# Patient Record
Sex: Female | Born: 1945 | Race: Black or African American | Hispanic: No | Marital: Single | Smoking: Former smoker
Health system: Southern US, Community
[De-identification: ages and names within clinical notes are randomized; demographics above are authoritative.]

## PROBLEM LIST (undated history)

## (undated) DIAGNOSIS — L409 Psoriasis, unspecified: Secondary | ICD-10-CM

## (undated) DIAGNOSIS — J310 Chronic rhinitis: Secondary | ICD-10-CM

## (undated) DIAGNOSIS — L2989 Other pruritus: Secondary | ICD-10-CM

## (undated) DIAGNOSIS — D329 Benign neoplasm of meninges, unspecified: Secondary | ICD-10-CM

## (undated) DIAGNOSIS — R202 Paresthesia of skin: Secondary | ICD-10-CM

## (undated) DIAGNOSIS — E079 Disorder of thyroid, unspecified: Secondary | ICD-10-CM

## (undated) DIAGNOSIS — Z8639 Personal history of other endocrine, nutritional and metabolic disease: Secondary | ICD-10-CM

## (undated) DIAGNOSIS — K219 Gastro-esophageal reflux disease without esophagitis: Secondary | ICD-10-CM

## (undated) DIAGNOSIS — L298 Other pruritus: Secondary | ICD-10-CM

## (undated) DIAGNOSIS — M1990 Unspecified osteoarthritis, unspecified site: Secondary | ICD-10-CM

## (undated) DIAGNOSIS — G709 Myoneural disorder, unspecified: Secondary | ICD-10-CM

## (undated) DIAGNOSIS — I10 Essential (primary) hypertension: Secondary | ICD-10-CM

## (undated) DIAGNOSIS — D649 Anemia, unspecified: Secondary | ICD-10-CM

## (undated) HISTORY — DX: Gastro-esophageal reflux disease without esophagitis: K21.9

## (undated) HISTORY — DX: Paresthesia of skin: R20.2

## (undated) HISTORY — DX: Benign neoplasm of meninges, unspecified: D32.9

## (undated) HISTORY — DX: Chronic rhinitis: J31.0

## (undated) HISTORY — DX: Other pruritus: L29.8

## (undated) HISTORY — DX: Anemia, unspecified: D64.9

## (undated) HISTORY — DX: Psoriasis, unspecified: L40.9

## (undated) HISTORY — PX: TONSILLECTOMY: SUR1361

## (undated) HISTORY — PX: OOPHORECTOMY: SHX86

## (undated) HISTORY — DX: Essential (primary) hypertension: I10

## (undated) HISTORY — DX: Other pruritus: L29.89

## (undated) HISTORY — DX: Disorder of thyroid, unspecified: E07.9

---

## 1973-03-13 HISTORY — PX: VESICOVAGINAL FISTULA CLOSURE W/ TAH: SUR271

## 1973-03-13 HISTORY — PX: ABDOMINAL HYSTERECTOMY: SHX81

## 1974-03-13 HISTORY — PX: ABDOMINAL SURGERY: SHX537

## 1974-03-13 HISTORY — PX: OTHER SURGICAL HISTORY: SHX169

## 1975-03-14 HISTORY — PX: OTHER SURGICAL HISTORY: SHX169

## 2000-03-13 HISTORY — PX: NASAL SINUS SURGERY: SHX719

## 2000-08-13 ENCOUNTER — Encounter (INDEPENDENT_AMBULATORY_CARE_PROVIDER_SITE_OTHER): Payer: Self-pay | Admitting: Specialist

## 2000-08-13 ENCOUNTER — Ambulatory Visit (HOSPITAL_BASED_OUTPATIENT_CLINIC_OR_DEPARTMENT_OTHER): Admission: RE | Admit: 2000-08-13 | Discharge: 2000-08-13 | Payer: Self-pay | Admitting: *Deleted

## 2003-01-15 ENCOUNTER — Encounter: Admission: RE | Admit: 2003-01-15 | Discharge: 2003-01-15 | Payer: Self-pay | Admitting: Family Medicine

## 2003-03-14 HISTORY — PX: OTHER SURGICAL HISTORY: SHX169

## 2004-02-09 ENCOUNTER — Ambulatory Visit (HOSPITAL_COMMUNITY): Admission: RE | Admit: 2004-02-09 | Discharge: 2004-02-09 | Payer: Self-pay | Admitting: Gastroenterology

## 2004-03-13 HISTORY — PX: COLONOSCOPY: SHX174

## 2004-03-13 LAB — HM COLONOSCOPY: HM Colonoscopy: NORMAL

## 2004-06-17 ENCOUNTER — Ambulatory Visit: Payer: Self-pay | Admitting: Family Medicine

## 2004-07-19 ENCOUNTER — Ambulatory Visit: Payer: Self-pay | Admitting: Internal Medicine

## 2005-02-10 HISTORY — PX: ORIF FINGER / THUMB FRACTURE: SUR932

## 2005-02-16 ENCOUNTER — Emergency Department (HOSPITAL_COMMUNITY): Admission: EM | Admit: 2005-02-16 | Discharge: 2005-02-17 | Payer: Self-pay | Admitting: *Deleted

## 2005-02-28 ENCOUNTER — Ambulatory Visit: Payer: Self-pay | Admitting: Family Medicine

## 2005-03-24 ENCOUNTER — Encounter (INDEPENDENT_AMBULATORY_CARE_PROVIDER_SITE_OTHER): Payer: Self-pay | Admitting: *Deleted

## 2005-03-24 ENCOUNTER — Other Ambulatory Visit: Admission: RE | Admit: 2005-03-24 | Discharge: 2005-03-24 | Payer: Self-pay | Admitting: Family Medicine

## 2005-03-24 ENCOUNTER — Ambulatory Visit: Payer: Self-pay | Admitting: Family Medicine

## 2005-03-24 LAB — CONVERTED CEMR LAB: Pap Smear: NORMAL

## 2005-03-28 ENCOUNTER — Ambulatory Visit: Payer: Self-pay | Admitting: Family Medicine

## 2005-07-28 ENCOUNTER — Ambulatory Visit: Payer: Self-pay | Admitting: Family Medicine

## 2005-11-23 ENCOUNTER — Ambulatory Visit: Payer: Self-pay | Admitting: Family Medicine

## 2005-12-20 ENCOUNTER — Ambulatory Visit: Payer: Self-pay | Admitting: Family Medicine

## 2006-05-01 ENCOUNTER — Ambulatory Visit: Payer: Self-pay | Admitting: Family Medicine

## 2006-06-12 ENCOUNTER — Encounter: Payer: Self-pay | Admitting: Family Medicine

## 2006-06-12 DIAGNOSIS — E162 Hypoglycemia, unspecified: Secondary | ICD-10-CM

## 2006-06-12 DIAGNOSIS — K219 Gastro-esophageal reflux disease without esophagitis: Secondary | ICD-10-CM

## 2006-06-12 DIAGNOSIS — D649 Anemia, unspecified: Secondary | ICD-10-CM

## 2006-06-12 DIAGNOSIS — J309 Allergic rhinitis, unspecified: Secondary | ICD-10-CM

## 2006-06-12 DIAGNOSIS — I10 Essential (primary) hypertension: Secondary | ICD-10-CM

## 2006-07-03 ENCOUNTER — Ambulatory Visit: Payer: Self-pay | Admitting: Family Medicine

## 2006-11-13 ENCOUNTER — Ambulatory Visit: Payer: Self-pay | Admitting: Family Medicine

## 2006-11-14 ENCOUNTER — Telehealth (INDEPENDENT_AMBULATORY_CARE_PROVIDER_SITE_OTHER): Payer: Self-pay | Admitting: *Deleted

## 2006-11-19 ENCOUNTER — Telehealth (INDEPENDENT_AMBULATORY_CARE_PROVIDER_SITE_OTHER): Payer: Self-pay | Admitting: *Deleted

## 2006-12-04 ENCOUNTER — Telehealth: Payer: Self-pay | Admitting: Family Medicine

## 2007-02-25 ENCOUNTER — Telehealth: Payer: Self-pay | Admitting: Family Medicine

## 2007-03-01 ENCOUNTER — Ambulatory Visit: Payer: Self-pay | Admitting: Family Medicine

## 2007-03-14 LAB — HM DEXA SCAN

## 2007-03-19 ENCOUNTER — Ambulatory Visit: Payer: Self-pay | Admitting: Family Medicine

## 2007-03-19 DIAGNOSIS — M810 Age-related osteoporosis without current pathological fracture: Secondary | ICD-10-CM

## 2007-03-22 ENCOUNTER — Telehealth: Payer: Self-pay | Admitting: Family Medicine

## 2007-03-22 LAB — CONVERTED CEMR LAB
ALT: 10 units/L (ref 0–35)
AST: 21 units/L (ref 0–37)
Albumin: 4.1 g/dL (ref 3.5–5.2)
Alkaline Phosphatase: 62 units/L (ref 39–117)
BUN: 6 mg/dL (ref 6–23)
Basophils Absolute: 0.2 10*3/uL — ABNORMAL HIGH (ref 0.0–0.1)
Basophils Relative: 3.8 % — ABNORMAL HIGH (ref 0.0–1.0)
Bilirubin, Direct: 0.1 mg/dL (ref 0.0–0.3)
CO2: 30 meq/L (ref 19–32)
Calcium: 9.5 mg/dL (ref 8.4–10.5)
Chloride: 104 meq/L (ref 96–112)
Cholesterol: 166 mg/dL (ref 0–200)
Creatinine, Ser: 0.7 mg/dL (ref 0.4–1.2)
Eosinophils Absolute: 0.1 10*3/uL (ref 0.0–0.6)
Eosinophils Relative: 2.2 % (ref 0.0–5.0)
GFR calc Af Amer: 110 mL/min
GFR calc non Af Amer: 91 mL/min
Glucose, Bld: 84 mg/dL (ref 70–99)
HCT: 35.3 % — ABNORMAL LOW (ref 36.0–46.0)
HDL: 51.2 mg/dL (ref >39.0)
Hemoglobin: 12.1 g/dL (ref 12.0–15.0)
LDL Cholesterol: 103 mg/dL — ABNORMAL HIGH (ref 0–99)
Lymphocytes Relative: 41.5 % (ref 12.0–46.0)
MCHC: 34.3 g/dL (ref 30.0–36.0)
MCV: 86.8 fL (ref 78.0–100.0)
Monocytes Absolute: 0.2 10*3/uL (ref 0.2–0.7)
Monocytes Relative: 2.8 % — ABNORMAL LOW (ref 3.0–11.0)
Neutro Abs: 3.1 10*3/uL (ref 1.4–7.7)
Neutrophils Relative %: 49.7 % (ref 43.0–77.0)
Platelets: 253 10*3/uL (ref 150–400)
Potassium: 4 meq/L (ref 3.5–5.1)
RBC: 4.07 M/uL (ref 3.87–5.11)
RDW: 14.1 % (ref 11.5–14.6)
Sodium: 141 meq/L (ref 135–145)
TSH: 0.23 microintl units/mL — ABNORMAL LOW (ref 0.35–5.50)
Total Bilirubin: 1 mg/dL (ref 0.3–1.2)
Total CHOL/HDL Ratio: 3.2
Total Protein: 7 g/dL (ref 6.0–8.3)
Triglycerides: 58 mg/dL (ref 0–149)
VLDL: 12 mg/dL (ref 0–40)
WBC: 6.1 10*3/uL (ref 4.5–10.5)

## 2007-03-25 ENCOUNTER — Encounter: Payer: Self-pay | Admitting: Family Medicine

## 2007-03-26 ENCOUNTER — Ambulatory Visit: Payer: Self-pay | Admitting: Family Medicine

## 2007-03-26 ENCOUNTER — Telehealth: Payer: Self-pay | Admitting: Family Medicine

## 2007-03-28 LAB — CONVERTED CEMR LAB
Free T4: 0.6 ng/dL (ref 0.6–1.6)
T3, Free: 2.8 pg/mL (ref 2.3–4.2)
TSH: 0.87 microintl units/mL (ref 0.35–5.50)

## 2007-03-29 ENCOUNTER — Encounter (INDEPENDENT_AMBULATORY_CARE_PROVIDER_SITE_OTHER): Payer: Self-pay | Admitting: *Deleted

## 2007-04-16 ENCOUNTER — Telehealth: Payer: Self-pay | Admitting: Family Medicine

## 2007-05-14 ENCOUNTER — Telehealth: Payer: Self-pay | Admitting: Family Medicine

## 2007-05-20 ENCOUNTER — Ambulatory Visit: Payer: Self-pay | Admitting: Family Medicine

## 2007-05-20 ENCOUNTER — Encounter: Admission: RE | Admit: 2007-05-20 | Discharge: 2007-05-20 | Payer: Self-pay | Admitting: Family Medicine

## 2007-05-23 ENCOUNTER — Encounter: Payer: Self-pay | Admitting: Family Medicine

## 2007-08-13 ENCOUNTER — Ambulatory Visit: Payer: Self-pay | Admitting: Family Medicine

## 2007-08-15 ENCOUNTER — Telehealth: Payer: Self-pay | Admitting: Family Medicine

## 2007-10-18 ENCOUNTER — Telehealth (INDEPENDENT_AMBULATORY_CARE_PROVIDER_SITE_OTHER): Payer: Self-pay | Admitting: *Deleted

## 2008-03-23 ENCOUNTER — Telehealth (INDEPENDENT_AMBULATORY_CARE_PROVIDER_SITE_OTHER): Payer: Self-pay | Admitting: *Deleted

## 2008-03-25 ENCOUNTER — Encounter: Payer: Self-pay | Admitting: Family Medicine

## 2008-03-27 ENCOUNTER — Encounter (INDEPENDENT_AMBULATORY_CARE_PROVIDER_SITE_OTHER): Payer: Self-pay | Admitting: *Deleted

## 2008-04-16 ENCOUNTER — Ambulatory Visit: Payer: Self-pay | Admitting: Family Medicine

## 2008-04-16 ENCOUNTER — Encounter: Admission: RE | Admit: 2008-04-16 | Discharge: 2008-04-16 | Payer: Self-pay | Admitting: Family Medicine

## 2008-05-14 ENCOUNTER — Ambulatory Visit: Payer: Self-pay | Admitting: Family Medicine

## 2008-10-16 ENCOUNTER — Ambulatory Visit: Payer: Self-pay | Admitting: Family Medicine

## 2008-10-21 LAB — CONVERTED CEMR LAB
ALT: 41 units/L — ABNORMAL HIGH (ref 0–35)
AST: 120 units/L — ABNORMAL HIGH (ref 0–37)
Albumin: 4.2 g/dL (ref 3.5–5.2)
Alkaline Phosphatase: 68 units/L (ref 39–117)
BUN: 7 mg/dL (ref 6–23)
Basophils Absolute: 0 10*3/uL (ref 0.0–0.1)
Basophils Relative: 0 % (ref 0.0–3.0)
Bilirubin, Direct: 0.1 mg/dL (ref 0.0–0.3)
CO2: 31 meq/L (ref 19–32)
Calcium: 9.5 mg/dL (ref 8.4–10.5)
Chloride: 105 meq/L (ref 96–112)
Creatinine, Ser: 0.8 mg/dL (ref 0.4–1.2)
Eosinophils Absolute: 0.2 10*3/uL (ref 0.0–0.7)
Eosinophils Relative: 4.2 % (ref 0.0–5.0)
GFR calc non Af Amer: 93.77 mL/min (ref >60)
Glucose, Bld: 96 mg/dL (ref 70–99)
HCT: 37 % (ref 36.0–46.0)
Hemoglobin: 12.5 g/dL (ref 12.0–15.0)
Lymphocytes Relative: 43.9 % (ref 12.0–46.0)
Lymphs Abs: 2 10*3/uL (ref 0.7–4.0)
MCHC: 33.7 g/dL (ref 30.0–36.0)
MCV: 89.6 fL (ref 78.0–100.0)
Monocytes Absolute: 0.4 10*3/uL (ref 0.1–1.0)
Monocytes Relative: 7.7 % (ref 3.0–12.0)
Neutro Abs: 2 10*3/uL (ref 1.4–7.7)
Neutrophils Relative %: 44.2 % (ref 43.0–77.0)
Platelets: 204 10*3/uL (ref 150.0–400.0)
Potassium: 4.4 meq/L (ref 3.5–5.1)
RBC: 4.13 M/uL (ref 3.87–5.11)
RDW: 13.9 % (ref 11.5–14.6)
Sodium: 143 meq/L (ref 135–145)
TSH: 0.54 microintl units/mL (ref 0.35–5.50)
Total Bilirubin: 0.8 mg/dL (ref 0.3–1.2)
Total Protein: 7.4 g/dL (ref 6.0–8.3)
WBC: 4.6 10*3/uL (ref 4.5–10.5)

## 2008-10-23 LAB — CONVERTED CEMR LAB: Vit D, 25-Hydroxy: 43 ng/mL (ref 30–89)

## 2008-10-29 ENCOUNTER — Ambulatory Visit: Payer: Self-pay | Admitting: Family Medicine

## 2008-10-29 ENCOUNTER — Telehealth: Payer: Self-pay | Admitting: Family Medicine

## 2008-10-30 LAB — CONVERTED CEMR LAB
ALT: 14 units/L (ref 0–35)
AST: 21 units/L (ref 0–37)
Albumin: 4 g/dL (ref 3.5–5.2)
Alkaline Phosphatase: 56 units/L (ref 39–117)
Bilirubin, Direct: 0.1 mg/dL (ref 0.0–0.3)
Total Bilirubin: 0.9 mg/dL (ref 0.3–1.2)
Total Protein: 7.3 g/dL (ref 6.0–8.3)

## 2009-01-13 ENCOUNTER — Ambulatory Visit: Payer: Self-pay | Admitting: Family Medicine

## 2009-02-18 ENCOUNTER — Telehealth: Payer: Self-pay | Admitting: Family Medicine

## 2009-05-31 ENCOUNTER — Telehealth: Payer: Self-pay | Admitting: Internal Medicine

## 2009-06-08 ENCOUNTER — Ambulatory Visit: Payer: Self-pay | Admitting: Family Medicine

## 2009-06-18 ENCOUNTER — Telehealth: Payer: Self-pay | Admitting: Family Medicine

## 2009-10-11 HISTORY — PX: OTHER SURGICAL HISTORY: SHX169

## 2009-10-21 ENCOUNTER — Encounter: Payer: Self-pay | Admitting: Family Medicine

## 2009-10-21 LAB — HM MAMMOGRAPHY: HM Mammogram: NORMAL

## 2009-10-25 ENCOUNTER — Encounter: Payer: Self-pay | Admitting: Family Medicine

## 2009-11-06 ENCOUNTER — Inpatient Hospital Stay: Payer: Self-pay | Admitting: Internal Medicine

## 2009-11-06 ENCOUNTER — Encounter: Payer: Self-pay | Admitting: Family Medicine

## 2009-11-07 ENCOUNTER — Encounter: Payer: Self-pay | Admitting: Family Medicine

## 2009-11-17 ENCOUNTER — Ambulatory Visit: Payer: Self-pay | Admitting: Family Medicine

## 2009-11-17 DIAGNOSIS — D320 Benign neoplasm of cerebral meninges: Secondary | ICD-10-CM | POA: Insufficient documentation

## 2009-11-17 DIAGNOSIS — G459 Transient cerebral ischemic attack, unspecified: Secondary | ICD-10-CM | POA: Insufficient documentation

## 2009-11-24 ENCOUNTER — Telehealth: Payer: Self-pay | Admitting: Family Medicine

## 2010-01-20 ENCOUNTER — Encounter: Payer: Self-pay | Admitting: Family Medicine

## 2010-01-27 ENCOUNTER — Telehealth: Payer: Self-pay | Admitting: Family Medicine

## 2010-02-07 ENCOUNTER — Ambulatory Visit: Payer: Self-pay | Admitting: Family Medicine

## 2010-02-08 ENCOUNTER — Telehealth: Payer: Self-pay | Admitting: Family Medicine

## 2010-02-10 ENCOUNTER — Telehealth: Payer: Self-pay | Admitting: Family Medicine

## 2010-02-11 ENCOUNTER — Telehealth (INDEPENDENT_AMBULATORY_CARE_PROVIDER_SITE_OTHER): Payer: Self-pay

## 2010-02-14 ENCOUNTER — Telehealth: Payer: Self-pay | Admitting: Family Medicine

## 2010-02-15 ENCOUNTER — Ambulatory Visit: Payer: Self-pay | Admitting: Internal Medicine

## 2010-02-16 ENCOUNTER — Telehealth: Payer: Self-pay | Admitting: Family Medicine

## 2010-02-18 ENCOUNTER — Telehealth: Payer: Self-pay | Admitting: Family Medicine

## 2010-02-22 ENCOUNTER — Ambulatory Visit: Payer: Self-pay | Admitting: Family Medicine

## 2010-03-21 ENCOUNTER — Ambulatory Visit
Admission: RE | Admit: 2010-03-21 | Discharge: 2010-03-21 | Payer: Self-pay | Source: Home / Self Care | Attending: Family Medicine | Admitting: Family Medicine

## 2010-04-12 NOTE — Progress Notes (Signed)
Summary: ? reaction to sulfa  Phone Note Call from Patient Call back at Home Phone [phone]   Caller: Patient Call For: Judith Part MD Summary of Call: Pt states she took one dose of bactrim and is having some tingling around mouth and in hands.  She is also having some mild itching.  She has taken sulfa in the past.  She knows she can take tcn and e-mycin.  [facility]. Initial call taken by: Lowella Petties CMA, AAMA,  February 18, 2010 2:43 PM  Follow-up for Phone Call        may change to erythromycin .  should cover sinusitis.  ensure she's taken before. Follow-up by: Eustaquio Boyden  MD,  February 18, 2010 2:56 PM  Additional Follow-up for Phone Call Additional follow up Details #1::        Patient notified. She says that she has taken this in the past.  Additional Follow-up by: Melody Comas,  February 18, 2010 2:58 PM    New/Updated Medications: ERYTHROMYCIN BASE 250 MG TABS (ERYTHROMYCIN BASE) one by mouth three times a day x 10 days Prescriptions: ERYTHROMYCIN BASE 250 MG TABS (ERYTHROMYCIN BASE) one by mouth three times a day x 10 days  #30 x 0   Entered and Authorized by:   Eustaquio Boyden  MD   Signed by:   Eustaquio Boyden  MD on 02/18/2010   Method used:   [facility]        [facility]       [address]       [place], [place]  [address]       Ph: [phone] or [phone]       Fax: [phone]   RxID:   [id number]

## 2010-04-12 NOTE — Progress Notes (Signed)
Summary: B12 level is high  Phone Note Call from Patient Call back at Home Phone [phone]   Caller: Patient Call For: Judith Part MD Summary of Call: Pt had some blood work done by neurologist and they told her that her vitamin B12 level is very high- she doesnt know the level, and she is asking what is the significance of that.  She does not take that in a supplement. Initial call taken by: Lowella Petties CMA, AAMA,  January 27, 2010 10:57 AM  Follow-up for Phone Call        please look into ingredients of all your supplements - like iron and multivitamin  stop anything with B12  perhaps you have a good diet  not worried about any danger in it that I am aware of  Follow-up by: Judith Part MD,  January 27, 2010 12:07 PM  Additional Follow-up for Phone Call Additional follow up Details #1::        Patient notified as instructed by telephone. Lewanda Rife LPN  January 27, 2010 2:46 PM

## 2010-04-12 NOTE — Assessment & Plan Note (Signed)
Summary: HAVING PROBLEMS WITH B/P MEDS/JRR   Vital Signs:  Patient profile:   65 year old female Height:      65 inches Weight:      158.25 pounds BMI:     26.43 Temp:     98 degrees F oral Pulse rate:   72 / minute Pulse rhythm:   regular BP sitting:   142 / 90  (left arm) Cuff size:   regular  Vitals Entered By: Lewanda Rife LPN (February 07, 2010 12:09 PM) CC: Problems with BP med   History of Present Illness: here for bp visit   went to the neurologist for the numbness and tingling  did not come up with anything neurol was recommended to see derm and disc hormones   she went on a fast -- and stopped everything completely -- bp med and vitamins  felt better in terms of itching  got a headache-- and bp was going up  went back on the norvasc -- and all her parethesias and her itching came back  same thing the next day   has been on norvasc for a long time    pt's wt is up 1 lb with bmi of 26  first bp today is 142/90  on norvasc since 98 never been on other med for bp    Allergies: 1)  Penicillin 2)  Indocin 3)  Penicillin G Potassium (Penicillin G Potassium) 4)  * Iodine; Iodine Containing Group  Past History:  Past Medical History: Last updated: 11/17/2009 Allergic rhinitis GERD Hypertension osteoporosis anemia  TIA? small menningioma (adv no f/u unless symptomatic by Dr Kemper Durie 8/11)  Past Surgical History: Last updated: 11/20/2009 Caesarean section Hysterectomy Oophorectomy Sinus surgery- (2002) Abd. surgery- adhesions Colonoscopy (2006) Dexa- osteopenia (2005). 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 Eyes:  Denies blurring and eye irritation. ENT:  Denies sinus pressure and sore throat. CV:  Denies chest pain or discomfort, palpitations, shortness of breath with exertion, and swelling of feet. Resp:  Denies cough, shortness of breath, and wheezing. GI:  Denies abdominal pain, change in bowel habits, and nausea. GU:  Denies urinary frequency. MS:  Denies joint pain, joint redness, and joint swelling. Derm:  Complains of itching; denies rash. Neuro:  Complains of tingling; denies tremors, visual disturbances, and weakness. Psych:  Denies anxiety and depression. Endo:  Denies cold intolerance and heat intolerance. Heme:  Denies abnormal bruising and bleeding.  Physical Exam  General:  Well-developed,well-nourished,in no acute distress; alert,appropriate and cooperative throughout examination Head:  normocephalic, atraumatic, and no abnormalities observed.  no temporal tenderness Eyes:  vision grossly intact, pupils equal, pupils round, and pupils reactive to light.  fundi grossly wnl Mouth:  pharynx pink and moist.   Neck:  supple with full rom and no masses or thyromegally, no JVD or carotid bruit  Lungs:  Normal respiratory effort, chest expands symmetrically. Lungs are clear to auscultation, no crackles or wheezes. Heart:  Normal rate and regular rhythm. S1 and S2 normal without gallop, murmur, click, rub or other extra sounds. Pulses:  R  and L carotid,radial,femoral,dorsalis pedis and posterior tibial pulses are full and equal bilaterally Extremities:  No clubbing, cyanosis, edema, or deformity noted with normal full range of motion of all joints.   Neurologic:  sensation intact to light touch, gait normal, and DTRs symmetrical and normal.   Skin:  Intact without suspicious lesions or rashes Cervical Nodes:  No lymphadenopathy  noted Psych:  normal affect, talkative and pleasant    Impression & Recommendations:  Problem # 1:  PARESTHESIA (ICD-782.0) Assessment Improved resolved off of her norvasc  will change HTN med to ace and update  f/u 4-6 wk  Problem # 2:  HYPERTENSION (ICD-401.9) Assessment: Deteriorated  this is up off norvasc change to lisinopril 10 mg and update disc poss side eff monitor at home keep swimming for exercise -- and continue low salt diet f/u 4-6 wk  The following medications were removed from the medication list:    Norvasc 10 Mg Tabs (Amlodipine besylate) ..... One by mouth once daily Her updated medication list for this problem includes:    Prinivil 10 Mg Tabs (Lisinopril) .Marland Kitchen... 1 by mouth once daily  BP today: 142/90 Prior BP: 136/94 (11/17/2009)  Labs Reviewed: K+: 4.4 (10/16/2008) Creat: : 0.8 (10/16/2008)   Chol: 166 (03/19/2007)   HDL: 51.2 (03/19/2007)   LDL: 103 (03/19/2007)   TG: 58 (03/19/2007)  Orders: Prescription Created Electronically [id number]  Complete Medication List: 1)  Multi-vitamin Tabs (Multiple vitamin) .... One by mouth daily 2)  Calcium With Vit D 1200 Mg  .... Take by mouth as directed 3)  Flax Seed Oil  .... Take by mouth as directed alternating with fish oil 4)  Derma-smoothe/fs Scalp 0.01 % Oil (Fluocinolone acetonide) .... Apply in thin film to affected areas of scalp once daily as needed - can leave on overnight is scalp is excessively dry 5)  Fe-caps 250 Mg Cpcr (Ferrous sulfate) .... Once daily ` 6)  Fish Oil Oil (Fish oil) .... Take 1 tablet by mouth once a day alternating with flax seed oil 7)  Flonase 50 Mcg/act Susp (Fluticasone propionate) .... 2 sprays in each nostril daily 8)  Flexeril 10 Mg Tabs (Cyclobenzaprine hcl) .... 1/2 to 1 by mouth up to three times a day as needed muscle spasm 9)  Zyrtec Allergy 10 Mg Tabs (Cetirizine hcl) .... One tab by mouth  every night 10)  Vitamin D3 1000 Unit Tabs (Cholecalciferol) .... Take 1  tablet by mouth once a day 11)  Similison Ear Drops  .... As needed earache 12)  Similisan Dry Eye Drops  .... Otc as directed. as needed 13)  Pataday 0.2 % Soln (Olopatadine hcl) .Marland Kitchen.. 1 drop in each eye once daily for eye allergy symptoms as needed 14)  Aspirin 81 Mg Tbec (Aspirin) .Marland Kitchen.. 1 by mouth once daily 15)  Prinivil 10 Mg Tabs (Lisinopril) .Marland Kitchen.. 1 by mouth once daily  Patient Instructions: 1)  stop the norvasc 2)  stop the lisinopril (prinivil) 10 mg daily-- I sent that to your pharmacy 3)  if any side effects like cough or if dizzy or blood pressure is too low - stop the medicine and call  4)  follow up with me in 4-6 weeks  5)  keep checking blood pressure at home  Prescriptions: PRINIVIL 10 MG TABS (LISINOPRIL) 1 by mouth once daily  #30 x 11   Entered and Authorized by:   Judith Part MD   Signed by:   Judith Part MD on 02/07/2010  Method used:   [facility]        [facility] [id number]       [address]       [place], [place]  [address]       Ph: [phone] or [phone]       Fax: [phone]   RxID:   [id number]    Orders Added: 1)  Prescription Created Electronically [G8553] 2)  Est. Patient Level III [92330]    Current Allergies (reviewed today): PENICILLIN INDOCIN PENICILLIN G POTASSIUM (PENICILLIN G POTASSIUM) * IODINE; IODINE CONTAINING GROUP

## 2010-04-12 NOTE — Progress Notes (Signed)
Summary: wants to try benicar  Phone Note Call from Patient Call back at Home Phone [phone] Call back at [phone]   Caller: Patient Call For: Judith Part MD Summary of Call: Pt stopped taking prinivil due to itching and the itching has subsided.  Her BP was 137/85 this morning and she has a headache.  She has been doing some research and is asking if she should try a low dose benicar.  [facility]. Initial call taken by: Lowella Petties CMA, AAMA,  February 10, 2010 8:14 AM  Follow-up for Phone Call        that is a good idea  start benicar px written on EMR for call in  if any side eff let me know -- if itching stop it immed  watch bp  f/u jan as planned Follow-up by: Judith Part MD,  February 10, 2010 12:32 PM  Additional Follow-up for Phone Call Additional follow up Details #1::        Advised pt., medicine sent to pharmacy. Additional Follow-up by: Lowella Petties CMA, AAMA,  February 10, 2010 12:50 PM    New/Updated Medications: BENICAR 20 MG TABS (OLMESARTAN MEDOXOMIL) 1 by mouth once daily Prescriptions: BENICAR 20 MG TABS (OLMESARTAN MEDOXOMIL) 1 by mouth once daily  #30 x 11   Entered by:   Lowella Petties CMA, AAMA   Authorized by:   Judith Part MD   Signed by:   Lowella Petties CMA, AAMA on 02/10/2010   Method used:   [facility]        [facility] (retail)       [address]       [place], [place]  [address]       Ph: [phone] or [phone]       Fax: [phone]   RxID:   [id number]

## 2010-04-12 NOTE — Progress Notes (Signed)
Summary: a little itching with diovan  Phone Note Call from Patient Call back at Home Phone [phone] Call back at [phone]   Caller: Patient Call For: Judith Part MD Summary of Call: Pt states she took her first dose of diovan last night because she had a headache, and felt just a little bit of itching.  She is planning to take a dose this morning.  She would like to continue to take this and see how she does. Initial call taken by: Lowella Petties CMA, AAMA,  February 11, 2010 9:00 AM  Follow-up for Phone Call        I thought she was on benicar and we just decreased dose? -- I'm confused, could you talk to her (see last phone note ) Follow-up by: Judith Part MD,  February 11, 2010 9:08 AM  Additional Follow-up for Phone Call Additional follow up Details #1::        Pt said she is taking Benicar 20mg  taking 1/2 tablet daily. Pt said her head feels better and no itching this AM. Pt will monitor BP and give update next week.Lewanda Rife LPN  February 11, 2010 10:21 AM      Additional Follow-up for Phone Call Additional follow up Details #2::    thanks - that sounds good  Follow-up by: Judith Part MD,  February 11, 2010 11:22 AM

## 2010-04-12 NOTE — Progress Notes (Signed)
Summary: wants lower dose on benicar  Phone Note Call from Patient Call back at [phone]   Caller: Patient Call For: Judith Part MD Summary of Call: Pt is asking if she can start benicar on a lower dose than 20 mg's, just to make sure she can take it.  Perhaps 10 mg's? Initial call taken by: Lowella Petties CMA, AAMA,  February 10, 2010 1:05 PM  Follow-up for Phone Call        they do not make a 10 mg so she can cut the pill in half to start Follow-up by: Judith Part MD,  February 10, 2010 1:56 PM  Additional Follow-up for Phone Call Additional follow up Details #1::        Patient notified as instructed by telephone. Lewanda Rife LPN  February 10, 2010 2:36 PM

## 2010-04-12 NOTE — Assessment & Plan Note (Signed)
Summary: CHILLS,EARSITCHING,PRODUCTIVE COUGH/RI   Vital Signs:  Patient profile:   65 year old female Weight:      156 pounds Temp:     98.5 degrees F oral Pulse rate:   76 / minute Pulse rhythm:   regular BP sitting:   156 / 100  (left arm) Cuff size:   regular  Vitals Entered By: Selena Batten Dance CMA Duncan Dull) (February 15, 2010 3:39 PM)  Serial Vital Signs/Assessments:  Time      Position  BP       Pulse  Resp  Temp     By                     132/100                        Eustaquio Boyden  MD  CC: Cough/Chills/Ears itch/Headache   History of Present Illness: CC: cough, chills, HA  Several month history of itching.  On amolodipine caused itching and tingling in fingers.  changed to lisinopril, caused itching and tingling again.  Started on benicar 20mg  which hasn't caused sxs, but not helping BP.  Itching gone.    Now 1 wk h/o cough productive of yellow sputum, congestion, chills.  slightly achey.  tried corsidin BP for cough, didn't really help.  + L eye slightly red last night with HA.  resolved today.  + tender ache R sinuses.  No fevers.  No abd pain, n/v/d, rashes.  No CP/tightness, SOB, leg swelling.  No sick contacts at home.  No smokers at home.  No h/o asthma.  + allergies.  ALL MEDS ON HOLD EXCEPT BENICAR.  Allergies: 1)  Penicillin 2)  Indocin 3)  Penicillin G Potassium (Penicillin G Potassium) 4)  * Iodine; Iodine Containing Group  Past History:  Past Medical History: Last updated: 11/17/2009 Allergic rhinitis GERD Hypertension osteoporosis anemia  TIA? small menningioma (adv no f/u unless symptomatic by Dr Kemper Durie 8/11)  Social History: Last updated: 02/15/2010 water exercise Former Designer, industrial/product for [employer]  Social History: water exercise Former Designer, industrial/product for [employer]  Review of Systems       per HPI  Physical Exam  General:  Well-developed,well-nourished,in no acute distress; alert,appropriate and  cooperative throughout examination, nontoxic Head:  normocephalic, atraumatic, and no abnormalities observed.  no temporal tenderness.  + frontal and maxillary sinus tenderness bilaterally Eyes:  vision grossly intact, pupils equal, pupils round, and pupils reactive to light.  Ears:  R ear normal and L ear normal.   Nose:  clear rhinorrhea with constant sniffling  some injection and congestion also Mouth:  pharynx pink and moist.   + PND Neck:  supple with full rom and no masses or thyromegally, no JVD or carotid bruit, no LAD Lungs:  Normal respiratory effort, chest expands symmetrically. Lungs are clear to auscultation, no crackles or wheezes. Heart:  Normal rate and regular rhythm. S1 and S2 normal without gallop, murmur, click, rub or other extra sounds. Pulses:  2+ rad pulses Extremities:  no pedal edema Skin:  Intact without suspicious lesions or rashes   Impression & Recommendations:  Problem # 1:  VIRAL URI (ICD-465.9) discussed supportive measures as per instructions.  red flags to return discussed.  liekly viral, possible early sinusitis.  if not better with measures, to call and let us know for abx course.  Her updated medication list for this problem includes:    Zyrtec  Allergy 10 Mg Tabs (Cetirizine hcl) ..... One tab by mouth  every night    Aspirin 81 Mg Tbec (Aspirin) .Marland Kitchen... 1 by mouth once daily  Problem # 2:  HYPERTENSION (ICD-401.9) intolerant to several meds in last few weeks.  doing well on benicar 20mg .  Bp up today, but also not feeling well.  advised to return in 1 wk with Dr. Milinda Antis (has appt scheduled), hopefully feeling better and will see what true bp on benicar is.  advised to stay well hydrated.  Her updated medication list for this problem includes:    Benicar 20 Mg Tabs (Olmesartan medoxomil) .Marland Kitchen... 1 by mouth once daily  Complete Medication List: 1)  Multi-vitamin Tabs (Multiple vitamin) .... One by mouth daily 2)  Calcium With Vit D 1200 Mg  .... Take by  mouth as directed 3)  Flax Seed Oil  .... Take by mouth as directed alternating with fish oil 4)  Derma-smoothe/fs Scalp 0.01 % Oil (Fluocinolone acetonide) .... Apply in thin film to affected areas of scalp once daily as needed - can leave on overnight is scalp is excessively dry 5)  Fe-caps 250 Mg Cpcr (Ferrous sulfate) .... Once daily ` 6)  Fish Oil Oil (Fish oil) .... Take 1 tablet by mouth once a day alternating with flax seed oil 7)  Flonase 50 Mcg/act Susp (Fluticasone propionate) .... 2 sprays in each nostril daily 8)  Flexeril 10 Mg Tabs (Cyclobenzaprine hcl) .... 1/2 to 1 by mouth up to three times a day as needed muscle spasm 9)  Zyrtec Allergy 10 Mg Tabs (Cetirizine hcl) .... One tab by mouth  every night 10)  Vitamin D3 1000 Unit Tabs (Cholecalciferol) .... Take 1 tablet by mouth once a day 11)  Similison Ear Drops  .... As needed earache 12)  Similisan Dry Eye Drops  .... Otc as directed. as needed 13)  Pataday 0.2 % Soln (Olopatadine hcl) .Marland Kitchen.. 1 drop in each eye once daily for eye allergy symptoms as needed 14)  Aspirin 81 Mg Tbec (Aspirin) .Marland Kitchen.. 1 by mouth once daily 15)  Benicar 20 Mg Tabs (Olmesartan medoxomil) .Marland Kitchen.. 1 by mouth once daily  Patient Instructions: 1)  Follow up with Dr. Milinda Antis in 1 wk for blood pressure.  It will be elevated when you're not feeling well 2)  You have an upper respiratory infection, possibly early sinsusitis 3)  Take medicines as prescribed:  restart flonase 4)  Take guaifenesin 400mg  IR 1 1/2 pills in am and at noon (or simple mucinex) with plenty of fluid to help mobilize mucous. 5)  Use nasal saline spray or neti pot to help drainage of sinuses. 6)  If you start having fevers >101.5, trouble swallowing or breathing, or are worsening instead of improving as expected, you may need to be seen again. 7)  Good to see you today, call clinic with questions.    Orders Added: 1)  Est. Patient Level III [16109]    Current Allergies (reviewed  today): PENICILLIN INDOCIN PENICILLIN G POTASSIUM (PENICILLIN G POTASSIUM) * IODINE; IODINE CONTAINING GROUP

## 2010-04-12 NOTE — Assessment & Plan Note (Signed)
Summary: EYES/CLE   Vital Signs:  Patient profile:   65 year old female Height:      65 inches Weight:      161.75 pounds BMI:     27.01 Temp:     98.1 degrees F oral Pulse rate:   76 / minute Pulse rhythm:   regular BP sitting:   124 / 82  (left arm) Cuff size:   regular  Vitals Entered By: Lewanda Rife LPN (June 08, 2009 3:53 PM) CC: eyes feel heavy at times and slight itching   History of Present Illness: eyes are bothering her - heavy and itching and hurting at times feels the need to close them  not really red  draining clear fluid   some sniffling too - nasal congestion  pressure behind her eyes in general -- a little worse on the R side   worried about possible sinus infection  teeth hurting / upper as well  nasal discharge is a little colored this am  some dry cough   sneezing -- feels really dry   drinking lots of water   thinks she had a fever one day- sweated more than usual    tried the generic zyrtec over the counter   Allergies: 1)  Penicillin 2)  Indocin 3)  Penicillin G Potassium (Penicillin G Potassium) 4)  * Iodine; Iodine Containing Group  Past History:  Past Medical History: Last updated: 10/20/2008 Allergic rhinitis GERD Hypertension osteoporosis anemia   Past Surgical History: Last updated: 10/20/2008 Caesarean section Hysterectomy Oophorectomy Sinus surgery- (2002) Abd. surgery- adhesions Colonoscopy (2006) Dexa- osteopenia (2005). 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 Eyes:  Denies blurring and eye irritation. ENT:  Complains of nasal congestion, postnasal drainage, and sinus pressure; denies sore throat. CV:  Denies chest pain or discomfort and palpitations. Resp:  Denies cough and wheezing. GI:  Denies abdominal pain, change in bowel habits, nausea, and vomiting. Derm:  Denies poor wound healing and rash. Neuro:  Complains of headaches.  Physical Exam  General:  Well-developed,well-nourished,in no acute distress; alert,appropriate and cooperative throughout examination Head:  normocephalic, atraumatic, and no abnormalities observed.  bilat frontal and maxillary sinus tenderness (reproducing her eye symptoms) Eyes:  vision grossly intact, pupils equal, pupils round, pupils reactive to light, and no injection.   no swelling or d/c  Ears:  R ear normal and L ear normal.   Nose:  clear rhinorrhea with constant sniffling  some injection and congestion also Mouth:  pharynx pink and moist, no erythema, and no exudates.   Neck:  supple with full rom and no masses or thyromegally, no JVD or carotid bruit  Lungs:  Normal respiratory effort, chest expands symmetrically. Lungs are clear to auscultation, no crackles or wheezes. Heart:  Normal rate and regular rhythm. S1 and S2 normal without gallop, murmur, click, rub or other extra sounds. Neurologic:  cranial nerves II-XII intact and gait normal.   Skin:  Intact without suspicious lesions or rashes Cervical Nodes:  No lymphadenopathy noted Psych:  normal affect, talkative and pleasant    Impression & Recommendations:  Problem #  1:  SINUSITIS - ACUTE-NOS (ICD-461.9) Assessment New  suspect this is causing her eye pressure/ also allergy congested enc to continue all meds tx with levaquin as directed  recommend sympt care- see pt instructions  -nasal saline  pt advised to update me if symptoms worsen or do not improve (may consider checking sed rate) Her updated medication list for this problem  includes:    Flonase 50 Mcg/act Susp (Fluticasone propionate) .Marland Kitchen... 2 sprays in each nostril daily    Levaquin 500 Mg Tabs (Levofloxacin) .Marland Kitchen... 1 by mouth once daily for 10 days for sinus infection  Orders: Prescription Created Electronically [phone])  Problem # 2:  ALLERGIC RHINITIS (ICD-477.9) Assessment: Deteriorated  worsening with spring pollen  recommend continue flonase and zyrtec  if ocular symptoms persist after tx of sinusitis - will consider pataday opthy drops- she will let me know  Her updated medication list for this problem includes:    Flonase 50 Mcg/act Susp (Fluticasone propionate) .Marland Kitchen... 2 sprays in each nostril daily    Zyrtec Allergy 10 Mg Tabs (Cetirizine hcl) ..... One tab by mouth  every morning  Orders: Prescription Created Electronically [phone])  Complete Medication List: 1)  Norvasc 10 Mg Tabs (Amlodipine besylate) .... One by mouth once daily 2)  Multi-vitamin Tabs (Multiple vitamin) .... One by mouth qd 3)  Calcium With Vit D 1200 Mg  .... Take by mouth as directed 4)  Flax Seed Oil  .... Take by mouth as directed alternating with fish oil 5)  Derma-smoothe/fs Scalp 0.01 % Oil (Fluocinolone acetonide) .... Apply in thin film to affected areas of scalp once daily as needed - can leave on overnight is scalp is excessively dry 6)  Fe-caps 250 Mg Cpcr (Ferrous sulfate) .... Once daily ` 7)  Fish Oil Oil (Fish oil) .... Take 1 tablet by mouth once a day alternating with flax seed oil 8)  Flonase 50 Mcg/act Susp (Fluticasone propionate) .... 2 sprays in each nostril daily 9)  Flexeril 10 Mg Tabs (Cyclobenzaprine hcl) .... 1/2 to 1 by mouth up to three times a day as needed muscle spasm 10)  Zyrtec Allergy 10 Mg Tabs (Cetirizine hcl) .... One tab by mouth  every morning 11)  Vitamin D3 1000 Unit Tabs (Cholecalciferol) .... Take 1 tablet by mouth once a day 12)  Similison Ear Drops  .... As needed earache 13)  Similisan Dry Eye Drops  .... Otc as directed. as  needed 14)  Levaquin 500 Mg Tabs (Levofloxacin) .Marland Kitchen.. 1 by mouth once daily for 10 days for sinus infection  Patient Instructions: 1)  drink lots of water 2)  continue flonase and generic zyrtec  3)  also nasal saline spray  4)  take the levaquin for sinus infection as directed  5)  update me in 10 days if not improved  6)  if eyes remain itchy and watery-- I will px drops for allergies for your eyes-- just call and let me know  Prescriptions: LEVAQUIN 500 MG TABS (LEVOFLOXACIN) 1 by mouth once daily for 10 days for sinus infection  #10 x 0   Entered and Authorized by:   Judith Part MD   Signed by:   Judith Part MD on 06/08/2009   Method used:   [facility]        [facility] [id number]       [address]       [place], [place]  [address]  Ph: [phone] or [phone]       Fax: [phone]   RxID:   [id number]   Current Allergies (reviewed today): PENICILLIN INDOCIN PENICILLIN G POTASSIUM (PENICILLIN G POTASSIUM) * IODINE; IODINE CONTAINING GROUP

## 2010-04-12 NOTE — Progress Notes (Signed)
Summary: update on BP  Phone Note Call from Patient Call back at Home Phone [phone]   Caller: Patient Call For: Judith Part MD Summary of Call: Patient says that her itching is a little better.  She says that her BP is not where it should be. Her readings have 139/85, 139/90. This morning when she first woke up it was 153/92. She said that the headaches have gotten a little better. She also wanted to let you know that she has been taking 20 mg instead of 10mg . She is asking what she should do at this point and if she just needs to give it a littel longer. I let patient know that you are out of the office today and tomorrow and that I would send it to another Shahmeer Bunn. She asked that I not send it to anyone else and that she only wanted your input. I told her that it could possilbly be wed before hearing back and she is fine with waiting.  Initial call taken by: Melody Comas,  February 14, 2010 8:12 AM  Follow-up for Phone Call        Patient called back stating that she has also been having chills for a few days, just not feeling very well. She feels like she may have some type of infection. She feels like that may also be tied to her having the headaches.  Melody Comas  February 14, 2010 2:38 PM   Additional Follow-up for Phone Call Additional follow up Details #1::        given all these new issues - please have her f/u with me later this week -- if appt avail Additional Follow-up by: Judith Part MD,  February 14, 2010 3:55 PM    Additional Follow-up for Phone Call Additional follow up Details #2::    Pt notified as instructed by phone. Dr Milinda Antis did not have available appt this week. Pt to see Dr. Patrice Paradise 02/15/10 at 3:30pm for infection symptoms and has appt to see Dr Milinda Antis 02/22/10 at 3:15 re: BP. Lewanda Rife LPN  February 14, 2010 4:14 PM

## 2010-04-12 NOTE — Progress Notes (Signed)
Summary: pt is much better  Phone Note Call from Patient   Caller: Patient Summary of Call: Pt called to say that she is doing much better than yesterday.  She is taking mucinex and drinking plenty of water.  Her BP today is 124/85.  She is very appreciative of the care she has received. Initial call taken by: Lowella Petties CMA, AAMA,  February 16, 2010 10:19 AM  Follow-up for Phone Call        thanks!  noted. Follow-up by: Eustaquio Boyden  MD,  February 16, 2010 10:25 AM

## 2010-04-12 NOTE — Progress Notes (Signed)
Summary: ? Eye drops or ointment  Phone Note Call from Patient Call back at Home Phone [phone] Call back at cell [phone]   Caller: Patient Call For: Judith Part MD Summary of Call: Was told by Dr. Milinda Antis to call back once she finished her antibiotic.  She is doing a little bit better but her eyes are feeling "heavy." Pain and pressure not as intense as it was when she came in but still there.  Dr. Milinda Antis said that she would call her in some medicated eye drops or ointment.  Please advise.  [facility] Initial call taken by: Linde Gillis CMA Duncan Dull),  June 18, 2009 10:43 AM  Follow-up for Phone Call        will try some allergy eye drops -- pataday  if no imp may need to check labs px written on EMR for call in  follow up with me in 2-4 weeks please  Follow-up by: Judith Part MD,  June 18, 2009 11:47 AM  Additional Follow-up for Phone Call Additional follow up Details #1::        Left message for patient to call back.  [facility]ication [facility] as instructed. Lewanda Rife LPN  June 19, 863 1:09 PM   [facility] advising pt that medicine had been called in and to call back to schedule follow up appt. Additional Follow-up by: Lowella Petties CMA,  June 18, 2009 3:17 PM    New/Updated Medications: PATADAY 0.2 % SOLN (OLOPATADINE HCL) 1 drop in each eye once daily for eye allergy symptoms Prescriptions: PATADAY 0.2 % SOLN (OLOPATADINE HCL) 1 drop in each eye once daily for eye allergy symptoms  #1 bottle x 11   Entered and Authorized by:   Judith Part MD   Signed by:   Lewanda Rife LPN on 78/46/9629   Method used:   Telephoned to ...       [facility] [facility]retail[facility]       [address]       [place], [place]  [address]       Ph: [phone] or [phone]       Fax: [phone]   RxID:   [id number]

## 2010-04-12 NOTE — Progress Notes (Signed)
Summary: requests antibiotic  Phone Note Call from Patient Call back at Home Phone [phone]   Caller: Patient Call For: Dr. Sharen Hones Summary of Call: Pt states she now knows she has a sinus infection- she's having sinus pain and pressure, both ears are hurting, clear mucous.  She is asking for an abx to be called to [facility]. Initial call taken by: Lowella Petties CMA, AAMA,  February 18, 2010 8:05 AM  Follow-up for Phone Call        discussed this at office visit.  will send in bactrim - allergy to PCN.  please notify to watch for gi upset with this med, if happens, to take with food.   Follow-up by: Eustaquio Boyden  MD,  February 18, 2010 8:09 AM  Additional Follow-up for Phone Call Additional follow up Details #1::        Patient notified as instructed via telephone.  Rx sent to pharmacy. Additional Follow-up by: Linde Gillis CMA Duncan Dull),  February 18, 2010 8:23 AM    New/Updated Medications: BACTRIM DS 800-160 MG TABS (SULFAMETHOXAZOLE-TRIMETHOPRIM) one two times a day x 10 days Prescriptions: BACTRIM DS 800-160 MG TABS (SULFAMETHOXAZOLE-TRIMETHOPRIM) one two times a day x 10 days  #20 x 0   Entered and Authorized by:   Eustaquio Boyden  MD   Signed by:   Eustaquio Boyden  MD on 02/18/2010   Method used:   [facility]        [facility]ail[facility]       [address]       [place], [place]  [address]       Ph: [phone] or [phone]       Fax: [phone]   RxID:   [id number]

## 2010-04-12 NOTE — Letter (Signed)
Summary: [facility]   Imported By: Lanelle Bal 11/12/2009 13:30:41  _____________________________________________________________________  External Attachment:    Type:   Image     Comment:   External Document  Appended Document: [facility]    Clinical Lists Changes  Observations: Added new observation of PAST SURG HX: Caesarean section Hysterectomy Oophorectomy Sinus surgery- (2002) Abd. surgery- adhesions Colonoscopy (2006) Dexa- osteopenia (2005). dexa 2009- osteoporosis/ fairly stable Thumb fracture (02/2005) Urethral stricture- dilated hosp TIA 8/11 small meningioma   (11/12/2009 16:32) Added new observation of PAST MED HX: Allergic rhinitis GERD Hypertension osteoporosis anemia  TIA small menningioma  (11/12/2009 16:32)       Past Medical History:    Allergic rhinitis    GERD    Hypertension    osteoporosis    anemia     TIA    small menningioma   Past Surgical History:    Caesarean section    Hysterectomy    Oophorectomy    Sinus surgery- (2002)    Abd. surgery- adhesions    Colonoscopy (2006)    Dexa- osteopenia (2005). dexa 2009- osteoporosis/ fairly stable    Thumb fracture (02/2005)    Urethral stricture- dilated    hosp TIA 8/11    small meningioma

## 2010-04-12 NOTE — Assessment & Plan Note (Signed)
Summary: F/U [facility]  D/C 11/07/09/[facility]   Vital Signs:  Patient profile:   65 year old female Height:      65 inches Weight:      157.75 pounds BMI:     26.35 Temp:     98.6 degrees F oral Pulse rate:   80 / minute Pulse rhythm:   regular BP sitting:   136 / 94  (left arm) Cuff size:   regular  Vitals Entered By: Lewanda Rife LPN (November 17, 2009 12:05 PM)  Serial Vital Signs/Assessments:  Time      Position  BP       Pulse  Resp  Temp     By                     130/82                         Judith Part MD  CC: f/u discharge from [facility] on 11/07/09 (Discharge note in EMR)   History of Present Illness: here for f/u of hosp at [facility] for likely TIA with full resolution  this manifested as numbness and weakness on one side  started with tingling of toes and fingers- both sides 1 day before  went on with her day -- then L side became more prominent with tingling in face    CT showed small mass R frontal cortex MRI verified small benign meningioma (not rel to her symptoms) neurologist Dr Chestine Spore adv no f/u of this unless symptomatic (for example seizure) MRA of head and neck were nl (r/o carotid pathology)  she was started on 81 mg aspirin (no reactions or problems)   echo done with results still pending  her wt is down 4 lb  bp 136/94 today-- up from usual 121/81 at home before coming over here   using pataday for discomfort in her eyes  few months ago did have some light flashes in L eye  went to eye doctor for full exam , dilated and got some new glasses that helped  heaviness in eyes got better after her hospitalization  still uses dry eye medicine   is not taking zocor -- they give her   Allergies: 1)  Penicillin 2)  Indocin 3)  Penicillin G Potassium (Penicillin G Potassium) 4)  * Iodine; Iodine Containing Group  Past History:  Past Surgical History: Last updated: 11/12/2009 Caesarean section Hysterectomy Oophorectomy Sinus surgery- (2002) Abd.  surgery- adhesions Colonoscopy (2006) Dexa- osteopenia (2005). 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 small menningioma (adv no f/u unless symptomatic by Dr Kemper Durie 8/11)  Review of Systems General:  Denies fatigue, loss of appetite, malaise, and sleep disorder. Eyes:  Complains of eye irritation; denies blurring and eye pain. ENT:  Denies sore throat. CV:  Denies chest pain or discomfort, lightheadness, palpitations, shortness of breath with exertion, and swelling of feet. Resp:  Denies cough, shortness of breath, and wheezing. GI:  Denies abdominal pain, change in bowel habits, indigestion, and nausea. GU:  Denies dysuria and urinary frequency. MS:  Denies cramps, muscle weakness, and stiffness. Derm:  Denies itching, lesion(s), poor wound healing, and rash. Neuro:  Denies falling down, headaches, inability to speak, memory loss, numbness, tingling, visual disturbances, and weakness. Psych:  Denies anxiety and depression. Endo:  Denies cold intolerance, excessive thirst, excessive urination, and heat intolerance. Heme:  Denies abnormal bruising and bleeding.  Physical Exam  General:  Well-developed,well-nourished,in no acute distress; alert,appropriate and cooperative throughout examination Head:  normocephalic, atraumatic, and no abnormalities observed.  no temporal tenderness Eyes:  vision grossly intact, pupils equal, pupils round, and pupils reactive to light.  fundi grossly wnl Mouth:  pharynx pink and  moist.   Neck:  supple with full rom and no masses or thyromegally, no JVD or carotid bruit  Chest Wall:  No deformities, masses, or tenderness noted. Lungs:  Normal respiratory effort, chest expands symmetrically. Lungs are clear to auscultation, no crackles or wheezes. Heart:  Normal rate and regular rhythm. S1 and S2 normal without gallop, murmur, click, rub or other extra sounds. Abdomen:  soft, non-tender, and normal bowel sounds.   Msk:  No deformity or scoliosis noted of thoracic or lumbar spine.  no acute joint changes  Pulses:  R and L carotid,radial,femoral,dorsalis pedis and posterior tibial pulses are full and equal bilaterally Extremities:  No clubbing, cyanosis, edema, or deformity noted with normal full range of motion of all joints.   Neurologic:  alert & oriented X3, cranial nerves II-XII intact, strength normal in all extremities, sensation intact to light touch, gait normal, DTRs symmetrical and normal, toes down bilaterally on Babinski, and Romberg negative.   Skin:  Intact without suspicious lesions or rashes Cervical Nodes:  No lymphadenopathy noted Psych:  normal affect, talkative and pleasant    Impression & Recommendations:  Problem # 1:  TIA (ICD-435.9) Assessment New mild/ and questionable with neg w/u will call for echo rev hosp records and all studies with pt  continue asa update if further symptoms Her updated medication list for this problem includes:    Aspirin 81 Mg Tbec (Aspirin) .Marland Kitchen... 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 One by mouth once daily 2)  Multi-vitamin Tabs (Multiple vitamin) .... One by mouth qd 3)  Calcium With Vit D 1200 Mg  .... Take by mouth as directed 4)  Flax Seed Oil  .... Take by mouth as directed alternating with fish oil 5)  Derma-smoothe/fs Scalp 0.01 % Oil  (Fluocinolone acetonide) .... Apply in thin film to affected areas of scalp once daily as needed - can leave on overnight is scalp is excessively dry 6)  Fe-caps 250 Mg Cpcr (Ferrous sulfate) .... Once daily ` 7)  Fish Oil Oil (Fish oil) .... Take 1 tablet by mouth once a day alternating with flax seed oil 8)  Flonase 50 Mcg/act Susp (Fluticasone propionate) .... 2 sprays in each nostril daily 9)  Flexeril 10 Mg Tabs (Cyclobenzaprine hcl) .... 1/2 to 1 by mouth up to three times a day as needed muscle spasm 10)  Zyrtec Allergy 10 Mg Tabs (Cetirizine hcl) .... One tab by mouth  every night 11)  Vitamin D3 1000 Unit Tabs (Cholecalciferol) .... Take 1 tablet by mouth once a day 12)  Similison Ear Drops  .... As needed earache 13)  Similisan Dry Eye Drops  .... Otc as directed. as needed 14)  Pataday 0.2 % Soln (Olopatadine hcl) .Marland Kitchen.. 1 drop in each eye once daily for eye allergy symptoms as needed 15)  Aspirin 81 Mg Tbec (Aspirin) .Marland Kitchen.. 1 by mouth once daily  Patient Instructions: 1)  please send for recent heart echo from [facility] -- from her last hospitalization  2)  continue the low dose 81 mg aspirin  3)  eat a healthy diet and keep up good exercise 4)  let me know if any symptoms re- occur  5)  do not miss meals -- make sure you get protien with every meal  6)  6 small meals are better than 3 big ones  7)  avoid sweets  Prescriptions: FLONASE 50 MCG/ACT  SUSP (FLUTICASONE PROPIONATE) 2 sprays in each nostril daily  #3 mdi x 3   Entered and Authorized by:   Judith Part MD   Signed by:   Judith Part MD on 11/17/2009   Method used:   Historical   RxID:   [id number] NORVASC 10 MG  TABS (AMLODIPINE BESYLATE) one by mouth once daily  #90 x 3   Entered and Authorized by:   Judith Part MD   Signed by:   Judith Part MD on 11/17/2009   Method used:   Historical   RxID:   [id number]   Current Allergies (reviewed today): PENICILLIN INDOCIN PENICILLIN G POTASSIUM  (PENICILLIN G POTASSIUM) * IODINE; IODINE CONTAINING GROUP

## 2010-04-12 NOTE — Miscellaneous (Signed)
  Clinical Lists Changes  Observations: Added new observation of MAMMOGRAM: Normal (10/21/2009 11:20)

## 2010-04-12 NOTE — Consult Note (Signed)
[facility]   Imported By: Lanelle Bal 01/28/2010 14:04:19  _____________________________________________________________________  External Attachment:    Type:   Image     Comment:   External Document

## 2010-04-12 NOTE — Progress Notes (Signed)
Summary: tingling has started back  Phone Note Call from Patient Call back at Home Phone [phone] Call back at cell [phone]   Caller: Patient Call For: Judith Part MD Summary of Call: Pt states the tingling in her hands, arms, legs and feet started again last night.  Still has the tingling now but not as bad.  Took a couple of aspirins through the night which helped.  Pt is asking what is the next step to check? Initial call taken by: Lowella Petties CMA,  November 24, 2009 9:32 AM  Follow-up for Phone Call        if symptoms worsen - especially if on one side of body or facial droop or if speech problems-- go to er  otherwise - I want to go ahead and refer her to neurology for further eval  I will do ref for pt care coordinator Follow-up by: Judith Part MD,  November 24, 2009 11:30 AM  New Problems: PARESTHESIA (ICD-782.0)   New Problems: PARESTHESIA (ICD-782.0)

## 2010-04-12 NOTE — Progress Notes (Signed)
Summary: prinivil is making pt itch  Phone Note Call from Patient Call back at Home Phone [phone] Call back at [phone]   Caller: Patient Call For: Judith Part MD Summary of Call: Pt was given prinivil yesterday but that is causing her to itch.  She is asking if she can try something else that wont make her itch.  Pt called back and said that she will give the medicine a couple of weeks to work, she said she knows she shouldnt expect improvement in one day. Initial call taken by: Lowella Petties CMA, AAMA,  February 08, 2010 8:38 AM  Follow-up for Phone Call        if she is itching or has rash I prefer she stop the med update me after she has been off of it 2-3 days to see if symptoms are gone and we can choose something else Follow-up by: Judith Part MD,  February 08, 2010 9:56 AM  Additional Follow-up for Phone Call Additional follow up Details #1::        Patient notified as instructed by telephone. Pt will call back with update.Lewanda Rife LPN  February 08, 2010 10:12 AM

## 2010-04-12 NOTE — Miscellaneous (Signed)
Summary: mammo results  Clinical Lists Changes  Observations: Added new observation of MAMMO DUE: 04/2008 (03/29/2007 12:33) Added new observation of MAMMOGRAM: normal (03/29/2007 12:33)       Preventive Care Screening  Mammogram:    Date:  03/29/2007    Next Due:  04/2008    Results:  normal

## 2010-04-12 NOTE — Progress Notes (Signed)
Summary: allergies  Phone Note Call from Patient Call back at Home Phone [phone] Call back at [phone]   Caller: Patient Call For: Judith Part MD Summary of Call: Patient says that she has been having trouble with her allergies. Feeling tightness around her eyes and burning and itchy eyes. She says that she was going to buy Zyrtec, but realized how expensive it is, wants to know what else you is recommend over the counter.  Initial call taken by: Melody Comas,  May 31, 2009 3:33 PM  Follow-up for Phone Call        generic cetirizine (zyrtec) is very inexpensive if she buys the generic and in a big bottle (the cheapest is [facility] and [facility] where a year's supply [365] are only $15 or less). she can get it fairly inexpensively at [place], [employer], [employer], etc Follow-up by: Cindee Salt MD,  May 31, 2009 5:41 PM  Additional Follow-up for Phone Call Additional follow up Details #1::        Spoke with patient and advised results.  Additional Follow-up by: Mervin Hack CMA Duncan Dull),  June 01, 2009 9:37 AM

## 2010-04-14 NOTE — Assessment & Plan Note (Signed)
Summary: 6WK FOLLOW UP / LFW   Vital Signs:  Patient profile:   65 year old female Height:      65 inches Weight:      153.75 pounds BMI:     25.68 Temp:     98.1 degrees F oral Pulse rate:   72 / minute Pulse rhythm:   regular BP sitting:   152 / 96  (left arm) Cuff size:   regular  Vitals Entered By: Lewanda Rife LPN (March 21, 2010 8:27 AM) CC: six week f/u   History of Present Illness: here for f/u of HTN  has been doing a lot better  sinus infection is better - still occ sinus headache still somewhat congested  is using flonase   past 2 weeks - a little itch and tingling in feet and hands   wt is down 2 lb  bp first check 152/96  on benicar 40  thinks her cuff may not be working well -- at home is getting 150s systolic  is slt imp  intol of ace and norvasc  and all to sulfa  takes generic zyrtec    Allergies: 1)  ! Sulfa 2)  ! Prinivil 3)  ! Norvasc 4)  Penicillin G Potassium (Penicillin G Potassium) 5)  * Iodine; Iodine Containing Group 6)  Penicillin 7)  Indocin 8)  Benicar  Past History:  Past Medical History: Last updated: 11/17/2009 Allergic rhinitis GERD Hypertension osteoporosis anemia  TIA? small menningioma (adv no f/u unless symptomatic by Dr Kemper Durie 8/11)  Past Surgical History: Last updated: 11/20/2009 Caesarean section Hysterectomy Oophorectomy Sinus surgery- (2002) Abd. surgery- adhesions Colonoscopy (2006) Dexa- osteopenia (2005). dexa 2009- osteoporosis/ fairly stable Thumb fracture (02/2005) Urethral stricture- dilated hosp TIA 8/11 small meningioma  8/11 2D echo normal  Family History: Last updated: 06/12/2006 father DM, throat ca mother MI, CHF, HTN brother CVA, DM sister CHF, DM brother HTN, DM  Social History: Last updated: 02/15/2010 water exercise Former Designer, industrial/product for [employer]  Risk Factors: Smoking Status: quit (03/19/2007)  Review of Systems General:  Denies chills,  fatigue, fever, and loss of appetite. Eyes:  Denies blurring and eye irritation. ENT:  Complains of postnasal drainage; denies nasal congestion and sinus pressure. CV:  Denies chest pain or discomfort, palpitations, shortness of breath with exertion, and swelling of feet. Resp:  Denies cough, shortness of breath, and wheezing. GI:  Denies indigestion and nausea. GU:  Denies urinary frequency. MS:  Denies muscle aches and cramps. Derm:  Denies itching, lesion(s), poor wound healing, and rash. Neuro:  Denies numbness and tingling. Endo:  Denies excessive thirst and excessive urination. Heme:  Denies abnormal bruising and bleeding.  Physical Exam  General:  Well-developed,well-nourished,in no acute distress; alert,appropriate and cooperative throughout examination Head:  normocephalic, atraumatic, and no abnormalities observed.  no sinus tenderness Eyes:  vision grossly intact, pupils equal, pupils round, and pupils reactive to light.  no conjunctival pallor, injection or icterus  Nose:  nares are boggy Mouth:  pharynx pink and moist.   Neck:  supple with full rom and no masses or thyromegally, no JVD or carotid bruit  Lungs:  Normal respiratory effort, chest expands symmetrically. Lungs are clear to auscultation, no crackles or wheezes. Heart:  Normal rate and regular rhythm. S1 and S2 normal without gallop, murmur, click, rub or other extra sounds. Abdomen:  no renal bruits  Msk:  No deformity or scoliosis noted of thoracic or lumbar spine.  no acute joint changes  Pulses:  2+ rad pulses Extremities:  no pedal edema Neurologic:  sensation intact to light touch, gait normal, and DTRs symmetrical and normal.   Skin:  Intact without suspicious lesions or rashes no rash  some dry skin on legs  Cervical Nodes:  No lymphadenopathy noted Psych:  normal affect, talkative and pleasant    Impression & Recommendations:  Problem # 1:  HYPERTENSION (ICD-401.9) Assessment Unchanged  bp still  not well controlled on benicar 40- and pt also is beginning to itch again  stop this  trial of beta blocker toprol xl 50  pt will watch bp and pulse closely and keep me updated disc poss side eff  f/u 1 mo  The following medications were removed from the medication list:    Benicar 40 Mg Tabs (Olmesartan medoxomil) .Marland Kitchen... 1 by mouth once daily Her updated medication list for this problem includes:    Toprol Xl 50 Mg Xr24h-tab (Metoprolol succinate) .Marland Kitchen... 1 by mouth once daily  BP today: 152/96 Prior BP: 160/98 (02/22/2010)  Labs Reviewed: K+: 4.4 (10/16/2008) Creat: : 0.8 (10/16/2008)   Chol: 166 (03/19/2007)   HDL: 51.2 (03/19/2007)   LDL: 103 (03/19/2007)   TG: 58 (03/19/2007)  Orders: Prescription Created Electronically [phone])  Problem # 2:  ALLERGIC RHINITIS (ICD-477.9) Assessment: Unchanged this continues to bother her - though sinusitis is gone  continue flonase/ zyrtec  ? consider allergist in future  Her updated medication list for this problem includes:    Flonase 50 Mcg/act Susp (Fluticasone propionate) .Marland Kitchen... 2 sprays in each nostril daily    Zyrtec Allergy 10 Mg Tabs (Cetirizine hcl) ..... One tab by mouth  every night as needed  Complete Medication List: 1)  Multi-vitamin Tabs (Multiple vitamin) .... One by mouth daily 2)  Calcium With Vit D 1200 Mg  .... Take by mouth as directed 3)  Flax Seed Oil  .... Take by mouth as directed alternating with fish oil 4)  Derma-smoothe/fs Scalp 0.01 % Oil (Fluocinolone acetonide) .... Apply in thin film to affected areas of scalp once daily as needed - can leave on overnight is scalp is excessively dry 5)  Fe-caps 250 Mg Cpcr (Ferrous sulfate) .... Once daily ` 6)  Fish Oil Oil (Fish oil) .... Take 1 tablet by mouth once a day alternating with flax seed oil 7)  Flonase 50 Mcg/act Susp (Fluticasone propionate) .... 2 sprays in each nostril daily 8)  Flexeril 10 Mg Tabs (Cyclobenzaprine hcl) .... 1/2 to 1 by mouth up to three times  a day as needed muscle spasm 9)  Zyrtec Allergy 10 Mg Tabs (Cetirizine hcl) .... One tab by mouth  every night as needed 10)  Vitamin D3 1000 Unit Tabs (Cholecalciferol) .... Take 1 tablet by mouth once a day 11)  Similison Ear Drops  .... As needed earache 12)  Similisan Dry Eye Drops  .... Otc as directed. as needed 13)  Pataday 0.2 % Soln (Olopatadine hcl) .Marland Kitchen.. 1 drop in each eye once daily for eye allergy symptoms as needed 14)  Aspirin 81 Mg Tbec (Aspirin) .Marland Kitchen.. 1 by mouth once daily 15)  Vitamin E ?mg  .... Take 1 capsule by mouth once a day 16)  Toprol Xl 50 Mg Xr24h-tab (Metoprolol succinate) .Marland Kitchen.. 1 by mouth once daily  Patient Instructions: 1)  stop the benicar- not working well and also ? causing itching  2)  try toprol xl  3)  if you feel dizzy or have pulse under 50 (beats per  minute) - let me know  4)  get new bp cuff- get OMRON for arm - regular size  5)  follow up in a month  Prescriptions: TOPROL XL 50 MG XR24H-TAB (METOPROLOL SUCCINATE) 1 by mouth once daily  #30 x 11   Entered and Authorized by:   Judith Part MD   Signed by:   Judith Part MD on 03/21/2010   Method used:   [facility] to        [facility] (retail)       [address]       [place], [place]  [address]       Ph: [phone] or [phone]       Fax: [phone]   RxID:   [id number]    Orders Added: 1)  Prescription Created Electronically [G8553] 2)  Est. Patient Level III [27253]    Current Allergies (reviewed today): ! SULFA ! PRINIVIL ! NORVASC PENICILLIN G POTASSIUM (PENICILLIN G POTASSIUM) * IODINE; IODINE CONTAINING GROUP PENICILLIN INDOCIN BENICAR

## 2010-04-14 NOTE — Assessment & Plan Note (Signed)
Summary: BP ISSUES AND ITCHING/RI   Vital Signs:  Patient profile:   65 year old female Height:      65 inches Weight:      155 pounds BMI:     25.89 Temp:     98.6 degrees F oral Pulse rate:   76 / minute Pulse rhythm:   regular BP sitting:   160 / 98  (left arm) Cuff size:   regular  Vitals Entered By: Lewanda Rife LPN (February 22, 2010 3:28 PM) CC: BP issues and itching on and off   History of Present Illness: seen 11/28 and norvasc was stopped due to itching then tried ace (prinivil)- that caused itching too  then tried low dose benicar at 10 -- itching imp but bp still high so inc to 20 mg  in interum seen for uri and given bactrim- that made her itch to and had to change  today bp is 160/98  wt is down 1 lb  is still on the benicar 20 mg in am   does not think the benicar makes her itch   the itching is gradually getting better   sinusitis -- on erythromycin three times a day -- on the 4th -- sinus symptoms are gradually getting better  was having headache from sinuses and also from the HTN  ears are itching on and off  no fever  is blowing out yellow but not too thick    Allergies: 1)  ! Sulfa 2)  ! Prinivil 3)  ! Norvasc 4)  Penicillin G Potassium (Penicillin G Potassium) 5)  * Iodine; Iodine Containing Group 6)  Penicillin 7)  Indocin  Past History:  Past Medical History: Last updated: 11/17/2009 Allergic rhinitis GERD Hypertension osteoporosis anemia  TIA? small menningioma (adv no f/u unless symptomatic by Dr Kemper Durie 8/11)  Past Surgical History: Last updated: 11/20/2009 Caesarean section Hysterectomy Oophorectomy Sinus surgery- (2002) Abd. surgery- adhesions Colonoscopy (2006) Dexa- osteopenia (2005). dexa 2009- osteoporosis/ fairly stable Thumb fracture (02/2005) Urethral stricture- dilated hosp TIA 8/11 small meningioma  8/11 2D echo normal  Family History: Last updated: 06/12/2006 father DM, throat ca mother MI, CHF,  HTN brother CVA, DM sister CHF, DM brother HTN, DM  Social History: Last updated: 02/15/2010 water exercise Former Designer, industrial/product for [employer]  Risk Factors: Smoking Status: quit (03/19/2007)  Review of Systems General:  Denies fatigue, malaise, and sleep disorder. Eyes:  Denies blurring and eye irritation. ENT:  Complains of earache, nasal congestion, postnasal drainage, and sinus pressure; denies difficulty swallowing. CV:  Denies chest pain or discomfort, lightheadness, and palpitations. Resp:  Complains of cough; denies shortness of breath, sputum productive, and wheezing. GI:  Denies abdominal pain, bloody stools, change in bowel habits, and nausea. GU:  Denies dysuria and urinary frequency. MS:  Denies joint pain, joint redness, and joint swelling. Derm:  Complains of itching; denies lesion(s), poor wound healing, and rash; itching is getting better and better  no rash . Neuro:  Denies numbness and tingling. Psych:  Denies anxiety and depression. Endo:  Denies excessive thirst, excessive urination, and heat intolerance. Heme:  Denies abnormal bruising and bleeding.  Physical Exam  General:  Well-developed,well-nourished,in no acute distress; alert,appropriate and cooperative throughout examination Head:  normocephalic, atraumatic, and no abnormalities observed.   Eyes:  vision grossly intact, pupils equal, pupils round, pupils reactive to light, and no injection.   Ears:  R ear normal and L ear normal.  (TMs are dull) Nose:  nares are  injected and congested bilaterally  Mouth:  pharynx pink and moist, no erythema, and no exudates.   Neck:  supple with full rom and no masses or thyromegally, no JVD or carotid bruit  Chest Wall:  No deformities, masses, or tenderness noted. Lungs:  Normal respiratory effort, chest expands symmetrically. Lungs are clear to auscultation, no crackles or wheezes. Heart:  Normal rate and regular rhythm. S1 and S2 normal  without gallop, murmur, click, rub or other extra sounds. Abdomen:  no renal bruits  Msk:  No deformity or scoliosis noted of thoracic or lumbar spine.   Pulses:  2+ rad pulses Extremities:  no pedal edema Neurologic:  sensation intact to light touch, gait normal, and DTRs symmetrical and normal.   Skin:  Intact without suspicious lesions or rashes Cervical Nodes:  No lymphadenopathy noted Psych:  normal affect, talkative and pleasant    Impression & Recommendations:  Problem # 1:  HYPERTENSION (ICD-401.9) Assessment Deteriorated  is tolerating benicar 20- will inc to 40 for better control f/u jan as planned update if side eff or problems  Her updated medication list for this problem includes:    Benicar 40 Mg Tabs (Olmesartan medoxomil) .Marland Kitchen... 1 by mouth once daily  BP today: 160/98 Prior BP: 156/100 (02/15/2010)  Labs Reviewed: K+: 4.4 (10/16/2008) Creat: : 0.8 (10/16/2008)   Chol: 166 (03/19/2007)   HDL: 51.2 (03/19/2007)   LDL: 103 (03/19/2007)   TG: 58 (03/19/2007)  Orders: Prescription Created Electronically [id number])  Problem # 2:  SINUSITIS - ACUTE-NOS (ICD-461.9) Assessment: Improved  this is slowly imp with erythromycin urged to get back on flonase and zyrtec if they help  call if no further imp Her updated medication list for this problem includes:    Flonase 50 Mcg/act Susp (Fluticasone propionate) .Marland Kitchen... 2 sprays in each nostril daily    Erythromycin Base 250 Mg Tabs (Erythromycin base) ..... One by mouth three times a day x 10 days  Orders: Prescription Created Electronically [id number])  Complete Medication List: 1)  Multi-vitamin Tabs (Multiple vitamin) .... One by mouth daily 2)  Calcium With Vit D 1200 Mg  .... Take by mouth as directed 3)  Flax Seed Oil  .... Take by mouth as directed alternating with fish oil 4)  Derma-smoothe/fs Scalp 0.01 % Oil (Fluocinolone acetonide) .... Apply in thin film to affected areas of scalp once daily as needed - can leave on  overnight is scalp is excessively dry 5)  Fe-caps 250 Mg Cpcr (Ferrous sulfate) .... Once daily ` 6)  Fish Oil Oil (Fish oil) .... Take 1 tablet by mouth once a day alternating with flax seed oil 7)  Flonase 50 Mcg/act Susp (Fluticasone propionate) .... 2 sprays in each nostril daily 8)  Flexeril 10 Mg Tabs (Cyclobenzaprine hcl) .... 1/2 to 1 by mouth up to three times a day as needed muscle spasm 9)  Zyrtec Allergy 10 Mg Tabs (Cetirizine hcl) .... One tab by mouth  every night as needed 10)  Vitamin D3 1000 Unit Tabs (Cholecalciferol) .... Take 1 tablet by mouth once a day 11)  Similison Ear Drops  .... As needed earache 12)  Similisan Dry Eye Drops  .... Otc as directed. as needed 13)  Pataday 0.2 % Soln (Olopatadine hcl) .Marland Kitchen.. 1 drop in each eye once daily for eye allergy symptoms as needed 14)  Aspirin 81 Mg Tbec (Aspirin) .Marland Kitchen.. 1 by mouth once daily 15)  Benicar 40 Mg Tabs (Olmesartan medoxomil) .Marland Kitchen.. 1 by mouth once daily  16)  Erythromycin Base 250 Mg Tabs (Erythromycin base) .... One by mouth three times a day x 10 days 17)  Vitamin E ?mg  .... Take 1 capsule by mouth once a day  Patient Instructions: 1)  continue your zyrtec and flonase  2)  increase benicar to 40 mg once daily  3)  (you can take another 20 mg when you get home today) 4)  finish your antibiotic  5)  update me if your symptoms worsen 6)  follow up in jan as planned  Prescriptions: BENICAR 40 MG TABS (OLMESARTAN MEDOXOMIL) 1 by mouth once daily  #30 x 11   Entered and Authorized by:   Judith Part MD   Signed by:   Judith Part MD on 02/22/2010   Method used:   [facility]        [id number]       [address]       [place], [place]  [address]       Ph: [phone] or [phone]       Fax: [phone]   RxID:   [id number]    Orders Added: 1)  Prescription Created Electronically [G8553] 2)  Est. Patient Level IV [36644]    Prior Medications: MULTI-VITAMIN    TABS (MULTIPLE VITAMIN) one by mouth daily CALCIUM WITH VIT D 1200 MG () take by mouth as directed FLAX SEED OIL () take by mouth as directed alternating with fish oil DERMA-SMOOTHE/FS SCALP 0.01 % OIL (FLUOCINOLONE ACETONIDE) apply in thin film to affected areas of scalp once daily as needed - can leave on overnight is scalp is excessively dry FE-CAPS 250 MG  CPCR (FERROUS SULFATE) once daily ` FISH OIL   OIL (FISH OIL) Take 1 tablet by mouth once a day alternating with flax seed oil FLONASE 50 MCG/ACT  SUSP (FLUTICASONE PROPIONATE) 2 sprays in each nostril daily FLEXERIL 10 MG  TABS (CYCLOBENZAPRINE HCL) 1/2 to 1 by mouth up to three times a day as needed muscle spasm ZYRTEC ALLERGY 10 MG TABS (CETIRIZINE HCL) one tab by mouth  every night as needed VITAMIN D3 1000 UNIT TABS (CHOLECALCIFEROL) Take 1 tablet by mouth once a day SIMILISON EAR DROPS () as needed earache SIMILISAN DRY EYE DROPS () OTC As directed. as needed PATADAY 0.2 % SOLN (OLOPATADINE HCL) 1 drop in each eye once daily for eye allergy symptoms as needed ASPIRIN 81 MG TBEC (ASPIRIN) 1 by mouth once daily ERYTHROMYCIN BASE 250 MG TABS (ERYTHROMYCIN BASE) one by mouth three times a day x 10 days VITAMIN E ?MG () Take 1 capsule by mouth once a day Current Allergies (reviewed today): ! SULFA ! PRINIVIL ! NORVASC PENICILLIN G POTASSIUM (PENICILLIN G POTASSIUM) * IODINE; IODINE CONTAINING GROUP PENICILLIN INDOCIN

## 2010-04-18 ENCOUNTER — Encounter: Payer: Self-pay | Admitting: Family Medicine

## 2010-04-18 ENCOUNTER — Ambulatory Visit (INDEPENDENT_AMBULATORY_CARE_PROVIDER_SITE_OTHER): Payer: Federal, State, Local not specified - PPO | Admitting: Family Medicine

## 2010-04-18 ENCOUNTER — Other Ambulatory Visit: Payer: Self-pay | Admitting: Family Medicine

## 2010-04-18 DIAGNOSIS — L299 Pruritus, unspecified: Secondary | ICD-10-CM | POA: Insufficient documentation

## 2010-04-18 DIAGNOSIS — I10 Essential (primary) hypertension: Secondary | ICD-10-CM

## 2010-04-18 DIAGNOSIS — J309 Allergic rhinitis, unspecified: Secondary | ICD-10-CM

## 2010-04-18 LAB — RENAL FUNCTION PANEL
Albumin: 4 g/dL (ref 3.5–5.2)
BUN: 10 mg/dL (ref 6–23)
CO2: 29 mEq/L (ref 19–32)
Calcium: 9 mg/dL (ref 8.4–10.5)
Chloride: 105 mEq/L (ref 96–112)
Creatinine, Ser: 0.7 mg/dL (ref 0.4–1.2)
GFR: 108.86 mL/min (ref >60.00)
Glucose, Bld: 84 mg/dL (ref 70–99)
Phosphorus: 4.3 mg/dL (ref 2.3–4.6)
Potassium: 3.6 mEq/L (ref 3.5–5.1)
Sodium: 142 mEq/L (ref 135–145)

## 2010-04-18 LAB — CBC WITH DIFFERENTIAL/PLATELET
Basophils Absolute: 0 10*3/uL (ref 0.0–0.1)
Basophils Relative: 0.5 % (ref 0.0–3.0)
Eosinophils Absolute: 0.1 10*3/uL (ref 0.0–0.7)
Eosinophils Relative: 3.1 % (ref 0.0–5.0)
HCT: 35.5 % — ABNORMAL LOW (ref 36.0–46.0)
Hemoglobin: 12.1 g/dL (ref 12.0–15.0)
Lymphocytes Relative: 39.8 % (ref 12.0–46.0)
Lymphs Abs: 1.9 10*3/uL (ref 0.7–4.0)
MCHC: 34.2 g/dL (ref 30.0–36.0)
MCV: 88 fl (ref 78.0–100.0)
Monocytes Absolute: 0.2 10*3/uL (ref 0.1–1.0)
Monocytes Relative: 5.1 % (ref 3.0–12.0)
Neutro Abs: 2.5 10*3/uL (ref 1.4–7.7)
Neutrophils Relative %: 51.5 % (ref 43.0–77.0)
Platelets: 191 10*3/uL (ref 150.0–400.0)
RBC: 4.03 Mil/uL (ref 3.87–5.11)
RDW: 14.7 % — ABNORMAL HIGH (ref 11.5–14.6)
WBC: 4.8 10*3/uL (ref 4.5–10.5)

## 2010-04-18 LAB — HEPATIC FUNCTION PANEL
ALT: 9 U/L (ref 0–35)
AST: 15 U/L (ref 0–37)
Albumin: 4 g/dL (ref 3.5–5.2)
Alkaline Phosphatase: 62 U/L (ref 39–117)
Bilirubin, Direct: 0.1 mg/dL (ref 0.0–0.3)
Total Bilirubin: 0.6 mg/dL (ref 0.3–1.2)
Total Protein: 6.6 g/dL (ref 6.0–8.3)

## 2010-04-18 LAB — LIPID PANEL
Cholesterol: 146 mg/dL (ref 0–200)
HDL: 45.3 mg/dL (ref >39.00)
LDL Cholesterol: 90 mg/dL (ref 0–99)
Total CHOL/HDL Ratio: 3
Triglycerides: 56 mg/dL (ref 0.0–149.0)
VLDL: 11.2 mg/dL (ref 0.0–40.0)

## 2010-04-18 LAB — TSH: TSH: 0.52 u[IU]/mL (ref 0.35–5.50)

## 2010-04-25 ENCOUNTER — Encounter: Payer: Self-pay | Admitting: Family Medicine

## 2010-04-28 NOTE — Assessment & Plan Note (Signed)
Summary: 1 MONTH FOLLOW-UP  Nurse Visit   Vital Signs:  Patient profile:   65 year old female Height:      65 inches Weight:      151 pounds BMI:     25.22 Temp:     97.7 degrees F oral Pulse rate:   72 / minute Pulse rhythm:   regular BP sitting:   160 / 94  (left arm) Cuff size:   regular  Vitals Entered By: Lewanda Rife LPN (April 18, 2010 8:14 AM)  Physical Exam  General:  Well-developed,well-nourished,in no acute distress; alert,appropriate and cooperative throughout examination Head:  normocephalic, atraumatic, and no abnormalities observed.  no sinus tenderness Eyes:  vision grossly intact, pupils equal, pupils round, and pupils reactive to light.  watery eyes  no conj injection Ears:  R ear normal and L ear normal.   Nose:  boggy but clear nares today Mouth:  pharynx pink and moist, no erythema, and no exudates.   Neck:  supple with full rom and no masses or thyromegally, no JVD or carotid bruit  Chest Wall:  No deformities, masses, or tenderness noted. Lungs:  Normal respiratory effort, chest expands symmetrically. Lungs are clear to auscultation, no crackles or wheezes. Heart:  Normal rate and regular rhythm. S1 and S2 normal without gallop, murmur, click, rub or other extra sounds. Abdomen:  no renal bruits  Msk:  No deformity or scoliosis noted of thoracic or lumbar spine.   Pulses:  2+ rad pulses Extremities:  no pedal edema Neurologic:  sensation intact to light touch, gait normal, and DTRs symmetrical and normal.   Skin:  Intact without suspicious lesions or rashes Cervical Nodes:  No lymphadenopathy noted Psych:  normal affect, talkative and pleasant    Past History:  Past Surgical History: Last updated: 11/20/2009 Caesarean section Hysterectomy Oophorectomy Sinus surgery- (2002) Abd. surgery- adhesions Colonoscopy (2006) Dexa- osteopenia (2005). dexa 2009- osteoporosis/ fairly stable Thumb fracture (02/2005) Urethral stricture- dilated hosp TIA  8/11 small meningioma  8/11 2D echo normal  Family History: Last updated: 06/12/2006 father DM, throat ca mother MI, CHF, HTN brother CVA, DM sister CHF, DM brother HTN, DM  Social History: Last updated: 02/15/2010 water exercise Former Designer, industrial/product for [employer]  Risk Factors: Smoking Status: quit (03/19/2007)  Past Medical History: Allergic rhinitis GERD Hypertension osteoporosis anemia  TIA? small menningioma (adv no f/u unless symptomatic by Dr Kemper Durie 8/11) tingling in hands of ? etiol pruritis full body without rash  mild intermittent psoriasis   History of Present Illness: here for f/u of HTN , itching, allergies   is feeling better on this bp med overall but pressure not much improved  wt is down 2 lb with good bmi   bp 160/94 today   on toprol xl 50  - is taking and really likes it -- her heart rate is more stable  pulse is 72 when on med has not taken bp - getting too nervous   in general does not think she is an anxious person     tingling in her hands and feet have not gone away  itching comes and goes  is interested in seeing allergist for itching  takes zyrtec as needed  she does eat a lot of nuts  eats healthy and also exercises   pt nontol to ace / norvasc and benicar all with itching   also wants me to check ears/ nose /eyes clear watery eyes with itching  itchy nose/  sneeze/ occ runny ? allergies worse    Review of Systems General:  Denies fatigue, fever, loss of appetite, and malaise. Eyes:  Complains of discharge, eye irritation, and itching; denies blurring; clear watery itchy eyes. ENT:  Complains of nasal congestion and postnasal drainage; denies sinus pressure and sore throat. CV:  Denies chest pain or discomfort, palpitations, shortness of breath with exertion, and swelling of feet. Resp:  Denies cough, shortness of breath, and wheezing. GI:  Denies abdominal pain, indigestion, nausea, and  vomiting. GU:  Denies urinary frequency. MS:  Denies muscle aches and cramps. Derm:  Complains of itching; denies lesion(s), poor wound healing, and rash. Neuro:  Denies headaches; headaches are better on toprol- used to have them occasionally. Psych:  gets anxious over bp. Endo:  Denies cold intolerance, excessive thirst, excessive urination, and heat intolerance. Heme:  Denies abnormal bruising and bleeding. Allergy:  Complains of itching eyes, seasonal allergies, and sneezing.   Impression & Recommendations:  Problem # 1:  HYPERTENSION (ICD-401.9) Assessment Unchanged  bp still up / pulse ok  pt tolerating this med well inc toprol xl to 100 mg -- update if low pulse/ etc- see inst  rev good lifestyle habits  itching is intermittent- starting to think it is not drug related after all  f/u 1 mo  HTN labs today Her updated medication list for this problem includes:    Toprol Xl 100 Mg Xr24h-tab (Metoprolol succinate) .Marland Kitchen... 1 by mouth once daily  Orders: Venipuncture (16109) TLB-Renal Function Panel (80069-RENAL) TLB-Lipid Panel (80061-LIPID) TLB-CBC Platelet - w/Differential (85025-CBCD) TLB-Hepatic/Liver Function Pnl (80076-HEPATIC) TLB-TSH (Thyroid Stimulating Hormone) (60454-UJW) Prescription Created Electronically [phone])  BP today: 160/94 Prior BP: 152/96 (03/21/2010)  Labs Reviewed: K+: 4.4 (10/16/2008) Creat: : 0.8 (10/16/2008)   Chol: 166 (03/19/2007)   HDL: 51.2 (03/19/2007)   LDL: 103 (03/19/2007)   TG: 58 (03/19/2007)  Problem # 2:  PRURITUS (ICD-698.9) Assessment: Unchanged ongoing without rash  no psoriasis plaques today in retrospect may not have been med related after all  also having runny nose and eyes pt does eat lots of nuts  will ref to allergist for eval and also poss testing  Orders: Allergy Referral  (Allergy)  Problem # 3:  ALLERGIC RHINITIS (ICD-477.9) Assessment: Deteriorated I urged pt to use zyrtec more regulalry for runny eyes and  nose was ref to allergist as well Her updated medication list for this problem includes:    Flonase 50 Mcg/act Susp (Fluticasone propionate) .Marland Kitchen... 2 sprays in each nostril daily    Zyrtec Allergy 10 Mg Tabs (Cetirizine hcl) ..... One tab by mouth  every night as needed  Complete Medication List: 1)  Multi-vitamin Tabs (Multiple vitamin) .... One by mouth daily 2)  Calcium With Vit D 1200 Mg  .... Take by mouth as directed 3)  Flax Seed Oil  .... Take by mouth as directed alternating with fish oil 4)  Derma-smoothe/fs Scalp 0.01 % Oil (Fluocinolone acetonide) .... Apply in thin film to affected areas of scalp once daily as needed - can leave on overnight is scalp is excessively dry 5)  Fe-caps 250 Mg Cpcr (Ferrous sulfate) .... Once daily ` 6)  Fish Oil Oil (Fish oil) .... Take 1 tablet by mouth once a day alternating with flax seed oil 7)  Flonase 50 Mcg/act Susp (Fluticasone propionate) .... 2 sprays in each nostril daily 8)  Flexeril 10 Mg Tabs (Cyclobenzaprine hcl) .... 1/2 to 1 by mouth up to three times a day as needed  muscle spasm 9)  Zyrtec Allergy 10 Mg Tabs (Cetirizine hcl) .... One tab by mouth  every night as needed 10)  Vitamin D3 1000 Unit Tabs (Cholecalciferol) .... Take 1 tablet by mouth once a day 11)  Similison Ear Drops  .... As needed earache 12)  Similisan Dry Eye Drops  .... Otc as directed. as needed 13)  Pataday 0.2 % Soln (Olopatadine hcl) .Marland Kitchen.. 1 drop in each eye once daily for eye allergy symptoms as needed 14)  Vitamin E ?mg  .... Take 1 capsule by mouth once a day 15)  Toprol Xl 100 Mg Xr24h-tab (Metoprolol succinate) .Marland Kitchen.. 1 by mouth once daily  CC: one month f/u   Allergies: 1)  ! Sulfa 2)  ! Prinivil 3)  ! Norvasc 4)  Penicillin G Potassium (Penicillin G Potassium) 5)  * Iodine; Iodine Containing Group 6)  Penicillin 7)  Indocin 8)  Benicar  Patient Instructions: 1)  increase your toprol xl to 100 mg daily  2)  if you have pulse under 60 consistently  or if you are dizzy or other side effects- stop it and call 3)  follow up in 1 month  4)  labs today  5)  keep up the good health habits    Orders Added: 1)  Allergy Referral  [Allergy] 2)  Venipuncture [36415] 3)  TLB-Renal Function Panel [80069-RENAL] 4)  TLB-Lipid Panel [80061-LIPID] 5)  TLB-CBC Platelet - w/Differential [85025-CBCD] 6)  TLB-Hepatic/Liver Function Pnl [80076-HEPATIC] 7)  TLB-TSH (Thyroid Stimulating Hormone) [84443-TSH] 8)  Prescription Created Electronically [G8553] 9)  Est. Patient Level IV [98119] Prescriptions: TOPROL XL 100 MG XR24H-TAB (METOPROLOL SUCCINATE) 1 by mouth once daily  #30 x 11   Entered and Authorized by:   Judith Part MD   Signed by:   Judith Part MD on 04/18/2010   Method used:   Electronically to        [facility] (retail)       [address]       [place], [place]  [address]       Ph: [phone] or [phone]       Fax: [phone]   RxID:   [id number]   Current Allergies (reviewed today): ! SULFA ! PRINIVIL ! NORVASC PENICILLIN G POTASSIUM (PENICILLIN G POTASSIUM) * IODINE; IODINE CONTAINING GROUP PENICILLIN INDOCIN BENICAR

## 2010-05-05 ENCOUNTER — Encounter: Payer: Self-pay | Admitting: Family Medicine

## 2010-05-10 ENCOUNTER — Telehealth: Payer: Self-pay | Admitting: Family Medicine

## 2010-05-10 ENCOUNTER — Ambulatory Visit (INDEPENDENT_AMBULATORY_CARE_PROVIDER_SITE_OTHER): Payer: Federal, State, Local not specified - PPO | Admitting: Family Medicine

## 2010-05-10 ENCOUNTER — Encounter: Payer: Self-pay | Admitting: Family Medicine

## 2010-05-11 ENCOUNTER — Telehealth: Payer: Self-pay | Admitting: Family Medicine

## 2010-05-13 DIAGNOSIS — I10 Essential (primary) hypertension: Secondary | ICD-10-CM

## 2010-05-16 ENCOUNTER — Ambulatory Visit (INDEPENDENT_AMBULATORY_CARE_PROVIDER_SITE_OTHER): Payer: Federal, State, Local not specified - PPO | Admitting: Cardiology

## 2010-05-16 ENCOUNTER — Encounter: Payer: Self-pay | Admitting: Cardiology

## 2010-05-16 DIAGNOSIS — I10 Essential (primary) hypertension: Secondary | ICD-10-CM

## 2010-05-16 DIAGNOSIS — R0789 Other chest pain: Secondary | ICD-10-CM | POA: Insufficient documentation

## 2010-05-18 ENCOUNTER — Ambulatory Visit: Payer: Federal, State, Local not specified - PPO | Admitting: Family Medicine

## 2010-05-19 NOTE — Progress Notes (Signed)
Summary: reporting BP  Phone Note Call from Patient   Caller: Patient Call For: Judith Part MD Summary of Call: Pt called to report BP reading of 138/78 this morning, pulse 65.  After she took her meds.                    Lowella Petties CMA, AAMA  May 11, 2010 12:28 PM   Follow-up for Phone Call        that is great! thanks for the update follow up with cardiology on monday as planned  Follow-up by: Judith Part MD,  May 11, 2010 1:53 PM  Additional Follow-up for Phone Call Additional follow up Details #1::        Patient notified as instructed by telephone. Lewanda Rife LPN  May 11, 2010 4:07 PM

## 2010-05-19 NOTE — Letter (Signed)
Summary: Pt cancelled appt/[facility]  Pt cancelled appt/[facility]   Imported By: Sherian Rein 05/09/2010 09:33:27  _____________________________________________________________________  External Attachment:    Type:   Image     Comment:   External Document

## 2010-05-19 NOTE — Assessment & Plan Note (Signed)
Summary: PER DR T-BLOOD PRESSURE   Vital Signs:  Patient profile:   65 year old female Height:      65 inches Weight:      145.75 pounds BMI:     24.34 Temp:     98.8 degrees F oral Pulse rate:   60 / minute Pulse rhythm:   regular BP sitting:   190 / 110  (left arm) Cuff size:   regular  Vitals Entered By: Lewanda Rife LPN (May 10, 2010 4:41 PM)  Serial Vital Signs/Assessments:  Time      Position  BP       Pulse  Resp  Temp     By                     182/100                        Judith Part MD  CC: BP check Pt's BP with her BP cuff was 199/104.   History of Present Illness: bp was doing well for a while   as low as 130s/80s at the allergists office then had allergy tests   after this went up  yesterday 170s systolic and then went to 150s today got higher and higher   much higher today  feels tightness in face and arms felt heavy   was dx with non allergic rhinitis   in retrospect does not know if she was really allergic to meds - esp the norvasc   does not feel anxious  she takes very good care of herself   many med intolerances - is difficult to control her HTN she is interested in seeing cardiol for help with this  has appt mon    Allergies: 1)  ! Sulfa 2)  ! Prinivil 3)  Penicillin G Potassium (Penicillin G Potassium) 4)  * Iodine; Iodine Containing Group 5)  Penicillin 6)  Indocin 7)  Benicar  Past History:  Past Medical History: Last updated: 04/18/2010 Allergic rhinitis GERD Hypertension osteoporosis anemia  TIA? small menningioma (adv no f/u unless symptomatic by Dr Kemper Durie 8/11) tingling in hands of ? etiol pruritis full body without rash  mild intermittent psoriasis  Past Surgical History: Last updated: 11/20/2009 Caesarean section Hysterectomy Oophorectomy Sinus surgery- (2002) Abd. surgery- adhesions Colonoscopy (2006) Dexa- osteopenia (2005). dexa 2009- osteoporosis/ fairly stable Thumb fracture  (02/2005) Urethral stricture- dilated hosp TIA 8/11 small meningioma  8/11 2D echo normal  Family History: Last updated: 06/12/2006 father DM, throat ca mother MI, CHF, HTN brother CVA, DM sister CHF, DM brother HTN, DM  Social History: Last updated: 02/15/2010 water exercise Former Designer, industrial/product for [employer]  Risk Factors: Smoking Status: quit (03/19/2007)  Review of Systems General:  Denies fatigue, fever, loss of appetite, and malaise. Eyes:  Denies blurring, double vision, and eye pain. CV:  Complains of lightheadness; denies chest pain or discomfort, near fainting, palpitations, shortness of breath with exertion, and swelling of feet; was lightheaded earlier today- better now. Resp:  Denies chest discomfort, chest pain with inspiration, cough, shortness of breath, sputum productive, and wheezing. GI:  Denies abdominal pain, change in bowel habits, and nausea. GU:  Denies urinary frequency. MS:  Denies joint redness and joint swelling. Derm:  Complains of itching; denies rash; got some itching in her feet after allergy testing , even with primarily negative skin responses. Neuro:  Denies headaches, numbness, poor balance, sensation of room spinning,  tingling, tremors, and visual disturbances; head pressure at times but not pain . Psych:  Denies anxiety and depression. Endo:  Denies cold intolerance, excessive thirst, excessive urination, and heat intolerance. Heme:  Denies abnormal bruising and bleeding.  Physical Exam  General:  Well-developed,well-nourished,in no acute distress; alert,appropriate and cooperative throughout examination Head:  normocephalic, atraumatic, and no abnormalities observed.   Mouth:  pharynx pink and moist.   Neck:  supple with full rom and no masses or thyromegally, no JVD or carotid bruit  Chest Wall:  No deformities, masses, or tenderness noted. Lungs:  Normal respiratory effort, chest expands symmetrically. Lungs are  clear to auscultation, no crackles or wheezes. Heart:  Normal rate and regular rhythm. S1 and S2 normal without gallop, murmur, click, rub or other extra sounds. Abdomen:  Bowel sounds positive,abdomen soft and non-tender without masses, organomegaly or hernias noted. no renal bruits  Msk:  No deformity or scoliosis noted of thoracic or lumbar spine.   Pulses:  2+ rad pulses Extremities:  no pedal edema Neurologic:  cranial nerves II-XII intact, sensation intact to light touch, gait normal, and DTRs symmetrical and normal.  no tremor  Skin:  few small areas of ecchymosis on R shoulder from previous allergy tests  Cervical Nodes:  No lymphadenopathy noted Psych:  normal affect, talkative and pleasant  not seemingly anxious    Impression & Recommendations:  Problem # 1:  HYPERTENSION (ICD-401.9) Assessment Deteriorated  very difficult time controlling bp with several med intolerances/ ? allergies  pt is willing to try norvasc again - even if some side eff for now until cardiol ref  ekg today-is ok  was ref to cardiol for further help managing this  Her updated medication list for this problem includes:    Toprol Xl 100 Mg Xr24h-tab (Metoprolol succinate) .Marland Kitchen... 1 by mouth once daily    Norvasc 10 Mg Tabs (Amlodipine besylate) .Marland Kitchen... 1 by mouth once daily  BP today: 190/110 Prior BP: 160/94 (04/18/2010)  Labs Reviewed: K+: 3.6 (04/18/2010) Creat: : 0.7 (04/18/2010)   Chol: 146 (04/18/2010)   HDL: 45.30 (04/18/2010)   LDL: 90 (04/18/2010)   TG: 56.0 (04/18/2010)  Complete Medication List: 1)  Multi-vitamin Tabs (Multiple vitamin) .... One by mouth daily 2)  Calcium With Vit D 1200 Mg  .... Take by mouth as directed 3)  Flax Seed Oil  .... Take by mouth as directed alternating with fish oil 4)  Derma-smoothe/fs Scalp 0.01 % Oil (Fluocinolone acetonide) .... Apply in thin film to affected areas of scalp once daily as needed - can leave on overnight is scalp is excessively dry 5)   Fe-caps 250 Mg Cpcr (Ferrous sulfate) .... Once daily ` 6)  Fish Oil Oil (Fish oil) .... Take 1 tablet by mouth once a day alternating with flax seed oil 7)  Flonase 50 Mcg/act Susp (Fluticasone propionate) .... 2 sprays in each nostril daily 8)  Flexeril 10 Mg Tabs (Cyclobenzaprine hcl) .... 1/2 to 1 by mouth up to three times a day as needed muscle spasm 9)  Vitamin D3 1000 Unit Tabs (Cholecalciferol) .... Take 1 tablet by mouth once a day 10)  Similison Ear Drops  .... As needed earache 11)  Similisan Dry Eye Drops  .... Otc as directed. as needed 12)  Pataday 0.2 % Soln (Olopatadine hcl) .Marland Kitchen.. 1 drop in each eye once daily for eye allergy symptoms as needed 13)  Vitamin E ?mg  .... Take 1 capsule by mouth once a day 14)  Toprol Xl 100 Mg Xr24h-tab (Metoprolol succinate) .Marland Kitchen.. 1 by mouth once daily 15)  Levocetirizine Dihydrochloride 5 Mg Tabs (Levocetirizine dihydrochloride) .... Take 1 tablet by mouth once a day as needed. 16)  Norvasc 10 Mg Tabs (Amlodipine besylate) .Marland Kitchen.. 1 by mouth once daily  Patient Instructions: 1)  start back on amlodipine 10 mg (norvasc) daily  2)  if any side effects let me know  3)  follow up with cardiology as planned  4)  call us with bp reports tomorrow  Prescriptions: NORVASC 10 MG TABS (AMLODIPINE BESYLATE) 1 by mouth once daily  #30 x 0   Entered and Authorized by:   Judith Part MD   Signed by:   Judith Part MD on 05/10/2010   Method used:   Print then Give to Patient   RxID:   [id number]      Current Allergies (reviewed today): ! SULFA ! PRINIVIL PENICILLIN G POTASSIUM (PENICILLIN G POTASSIUM) * IODINE; IODINE CONTAINING GROUP PENICILLIN INDOCIN BENICAR   EKG  Procedure date:  05/11/2010  Findings:      sinus bradycardia with rate of 51  Appended Document: PER DR T-BLOOD PRESSURE

## 2010-05-19 NOTE — Progress Notes (Signed)
Summary: regarding blood pressure  Phone Note Call from Patient Call back at Home Phone [phone] Call back at cell [phone]   Caller: Patient Call For: Susan Part MD Summary of Call: Pt reports blood pressure was 181/92 this morning, and then down to 153/76, before medicine.  Pulse was 40.  She feels fine.  She has appt to see you on 2/7, do you want to see her sooner?  Usually runs in the 150's/70's.   Lowella Petties CMA, AAMA  May 10, 2010 10:16 AM  Pt called back, took her BP again and it was 200/96, she took her medicine about an hour and a half ago, pulse 54.            Lowella Petties CMA, AAMA  May 10, 2010 10:34 AM   Follow-up for Phone Call        check bp again in another 1/2 hour -- since she just took med at this point- in light of so many med intolerances for HTN -- I think we should refer her to cardiology for some help with bp management  please ask if she is agreeable to this? Follow-up by: Susan Part MD,  May 10, 2010 10:51 AM  Additional Follow-up for Phone Call Additional follow up Details #1::        Patient notified as instructed by telephone. Pt said she is agreeable to go to cardiologist. Pt would prefer [place] and can be reached at [phone] or cell [phone]. Pt will wait to hear from pt care coordinator about appt. Pt said today she has had slight dizziness, no chest pain today but on and off has had a dull ache in left breast, and h/a on and off. Pain level now is 0. Pt will call back in 1/2 hr with BP report.Lewanda Rife LPN  May 10, 2010 10:59 AM     Additional Follow-up for Phone Call Additional follow up Details #2::    thanks= I will do referral  update me when her next bp is done-thanks  Follow-up by: Susan Part MD,  May 10, 2010 11:21 AM  Additional Follow-up for Phone Call Additional follow up Details #3:: Details for Additional Follow-up Action Taken: Patient called to let you know that her BP is now up to  206/102.  Melody Comas  May 10, 2010 11:33 AM  it sounds like we got her appt with cardiology on monday- that is good in light of how high bp is now .. I do want to see her today just to check in  please put her in my 4:15 slot and have her bring her cuff also  if worse symptoms- I want her to go to ER   Additional Follow-up by: Susan Part MD,  May 10, 2010 12:13 PM  Patient notified as instructed by telephone. Aram Beecham will add pt to the blocked 4:15 appt. today.Lewanda Rife LPN  May 10, 2010 12:18 PM

## 2010-05-23 ENCOUNTER — Telehealth: Payer: Self-pay | Admitting: Family Medicine

## 2010-05-24 NOTE — Consult Note (Signed)
Summary: [facility]  [facility]   Imported By: Lanelle Bal 05/17/2010 08:30:58  _____________________________________________________________________  External Attachment:    Type:   Image     Comment:   External Document

## 2010-05-24 NOTE — Assessment & Plan Note (Signed)
Summary: Elevated BP;Dizziness;CP/AMD   Visit Type:  Initial Consult Primary Provider:  Colon Flattery Tower MD  CC:  c/o blood pressure elevated.  Occas. has some chest pain; had a spell last Saturday when she woke up and felt some heaviness in chest. .  History of Present Illness: 65 yo with history of HTN presents for cardiology evaluation.  She was admitted to [facility] in 8/11 with numbness/tingling in both arms.  She did not have a stroke.  Initially, there was concern that she had a TIA, but it is now thought that it was not a TIA.  She has minimal cardiopulmonary symptoms.  She swims, walks, and uses the elliptical with no exertional chest pain or dyspnea.  He had an episode last Saturday where she woke up with chest heaviness that eventually resolved. It was not exertional.  She has had no other episodes of chest pain.    Blood pressure has been difficult to control.  She was initially on amlodipine, but there was some concern that this caused itching.  However, her SBP went up to 180-200, so amlodipine was recently restarted by Dr. Milinda Antis with fall in systolic blood pressure to 130s/140s.  BP today is 140/90.  ECG: NSR, normal  Labs (2/12): LDL 90, HDL 45, K 3.6, creatinine 0.7  Current Medications (verified): 1)  Multi-Vitamin   Tabs (Multiple Vitamin) .... One By Mouth Daily 2)  Calcium With Vit D 1200 Mg .... Take By Mouth As Directed 3)  Flax Seed Oil .... Take By Mouth As Directed Alternating With Fish Oil 4)  Derma-Smoothe/fs Scalp 0.01 % Oil (Fluocinolone Acetonide) .... Apply in Thin Film To Affected Areas of Scalp Once Daily As Needed - Can Leave On Overnight Is Scalp Is Excessively Dry 5)  Fe-Caps 250 Mg  Cpcr (Ferrous Sulfate) .... Once Daily ` 6)  Fish Oil   Oil (Fish Oil) .... Take 1 Tablet By Mouth Once A Day Alternating With Flax Seed Oil 7)  Flonase 50 Mcg/act  Susp (Fluticasone Propionate) .... 2 Sprays in Each Nostril Daily 8)  Flexeril 10 Mg  Tabs (Cyclobenzaprine Hcl) ....  1/2 To 1 By Mouth Up To Three Times A Day As Needed Muscle Spasm 9)  Vitamin D3 1000 Unit Tabs (Cholecalciferol) .... Take 1 Tablet By Mouth Once A Day 10)  Similison Ear Drops .... As Needed Earache 11)  Similisan Dry Eye Drops .... Otc As Directed. As Needed 12)  Pataday 0.2 % Soln (Olopatadine Hcl) .Marland Kitchen.. 1 Drop in Each Eye Once Daily For Eye Allergy Symptoms As Needed 13)  Vitamin E ?mg .... Take 1 Capsule By Mouth Once A Day 14)  Toprol Xl 100 Mg Xr24h-Tab (Metoprolol Succinate) .Marland Kitchen.. 1 By Mouth Once Daily 15)  Levocetirizine Dihydrochloride 5 Mg Tabs (Levocetirizine Dihydrochloride) .... Take 1 Tablet By Mouth Once A Day As Needed. 16)  Norvasc 10 Mg Tabs (Amlodipine Besylate) .Marland Kitchen.. 1 By Mouth Once Daily  Allergies (verified): 1)  ! Sulfa 2)  ! Prinivil 3)  Penicillin G Potassium (Penicillin G Potassium) 4)  * Iodine; Iodine Containing Group 5)  Penicillin 6)  Indocin 7)  Benicar  Past History:  Past Surgical History: Last updated: 11/20/2009 Caesarean section Hysterectomy Oophorectomy Sinus surgery- (2002) Abd. surgery- adhesions Colonoscopy (2006) Dexa- osteopenia (2005). 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 Nonallergic rhinitis 2. GERD 3. Hypertension: Echo (8/11) EF 55%, mild MR, mild TR 4. osteoporosis 5. anemia  6. Meningioma: Small (advised no f/u unless symptomatic by Dr Kemper Durie 8/11) 7. Tingling in hands of ? etiol 8. Pruritis full body without rash  9. 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   Vital Signs:  Patient profile:   65 year old female Height:      65 inches Weight:      145 pounds BMI:     24.22 Pulse rate:   59 / minute BP sitting:   140 / 90  (left arm) Cuff size:   regular  Vitals Entered By: Bishop Dublin, CMA (May 16, 2010 3:46 PM)  Physical Exam  General:  Well developed, well nourished, in no acute distress. Head:  normocephalic and atraumatic Nose:  no deformity, discharge, inflammation, or lesions Mouth:  Teeth, gums and palate normal. Oral mucosa normal. Neck:  Neck supple, no JVD. No masses, thyromegaly or abnormal cervical nodes. Lungs:  Clear bilaterally to auscultation and percussion. Heart:  Non-displaced PMI, chest non-tender; regular rate and rhythm, S1, S2 without murmurs, rubs.  +S4. Carotid upstroke normal, no bruit. Pedals normal pulses. No edema, no varicosities. Abdomen:  Bowel sounds positive; abdomen soft and non-tender without masses, organomegaly, or hernias noted. No hepatosplenomegaly. Extremities:  No clubbing or cyanosis. Neurologic:  Alert and oriented x 3. Skin:  Intact without lesions or rashes. Psych:  Normal affect.   Impression & Recommendations:  Problem # 1:  HYPERTENSION (ICD-401.9) BP seems better controlled since starting amlodipine.  She has not had any side effects that could be linked to amlodipine.   BP today is 140/90. She had an echo in 8/11 with preserved EF and no significant valve abnormalities.  Continue current meds and will have her check BP daily for 2 wks.  We will call her at that point to see what BP has been running.   Problem # 2:  TIA (ICD-435.9) Suspect that  episode was not a TIA.  However, given concern as well as risk factors, she ought to take ASA 81 mg daily.  ASA has caused stomach problems in the past, so I will have her try ASA 81 mg every other day.    Problem # 3:  CHEST PAIN-PRECORDIAL (ICD-786.51) Very atypical single episode of chest pain.  If this recurs, would get myoview.  I will see her back in 1 month to see if she has been having any further chest pain.

## 2010-05-31 NOTE — Progress Notes (Signed)
Summary: BP was good today  Phone Note Call from Patient   Caller: Patient Call For: Judith Part MD Summary of Call: Pt called to let you know that her BP was 121/76 today.  She wanted to share the good, not just the bad! Initial call taken by: Lowella Petties CMA, AAMA,  May 23, 2010 5:22 PM  Follow-up for Phone Call        I'm thrilled- thanks for the update I also rev her cardiology note .. is reassuring  Follow-up by: Judith Part MD,  May 23, 2010 5:25 PM  Additional Follow-up for Phone Call Additional follow up Details #1::        Patient notified as instructed by telephone. Lewanda Rife LPN  May 23, 2010 5:35 PM

## 2010-06-24 ENCOUNTER — Ambulatory Visit: Payer: Federal, State, Local not specified - PPO | Admitting: Cardiology

## 2010-06-28 ENCOUNTER — Telehealth: Payer: Self-pay | Admitting: *Deleted

## 2010-06-28 NOTE — Telephone Encounter (Signed)
Patient notified as instructed by telephone. 

## 2010-06-28 NOTE — Telephone Encounter (Signed)
Patient called to let you know that her blood pressure is 100/63 and her pulse is 67. She was told that her blood pressure cuff is 10 points lower than ours and is asking if that means her blood pressure is really only 90/53. She says that she feels fine. Please advise.

## 2010-06-28 NOTE — Telephone Encounter (Signed)
That is ok with me as long as she feels fine If she becomes dizzy/weak- update me Thanks for letting me know

## 2010-06-28 NOTE — Telephone Encounter (Signed)
If symptoms worsen or her bp goes down further - cut her norvasc / amlodipine in 1/2 and just take 1/2 pill  Then follow up when she returns

## 2010-06-28 NOTE — Telephone Encounter (Signed)
Patient notified as instructed by telephone. Pt said it not real bad but she has had some light headedness, eyes feel weak and she feels tired. Pt is leaving Thur 06/30/10 to go out of country until end of month. Pt would like call back with Dr Royden Purl opinion on Wed.

## 2010-07-29 ENCOUNTER — Other Ambulatory Visit: Payer: Federal, State, Local not specified - PPO

## 2010-07-29 NOTE — Op Note (Signed)
NAMEBONNE, WHACK NO.:  [id number]   MEDICAL RECORD NO.:  [id number]          PATIENT TYPE:  AMB   LOCATION:  ENDO                         FACILITY:  [facility]   PHYSICIAN:  Graylin Shiver, M.D.   DATE OF BIRTH:  14-Feb-1946   DATE OF PROCEDURE:  02/09/2004  DATE OF DISCHARGE:                                 OPERATIVE REPORT   PROCEDURE PERFORMED:  Colonoscopy.   INDICATIONS FOR PROCEDURE:  Screening.   Informed consent was obtained after explanation of the risks of bleeding,  infection, and perforation.   PREMEDICATIONS:  Fentanyl 100 mcg  IV, Versed 7 mg IV.   DESCRIPTION OF PROCEDURE:  With the patient in the left lateral decubitus  position, a rectal exam was performed and no masses were felt.  The Olympus  colonoscope was inserted into the rectum and advanced around the colon to  the cecum.  Cecal landmarks were identified.  The cecum and ascending colon  were normal.  The transverse colon normal.  The descending colon, sigmoid  and rectum were normal.  The patient tolerated the procedure well without  complications.   IMPRESSION:  Normal colonoscopy to the cecum.   RECOMMENDATIONS:  I would recommend follow-up screening colonoscopy again in  10 years.       SFG/MEDQ  D:  02/09/2004  T:  02/09/2004  Job:  [id number]   cc:   Vikki Ports, M.D.  [address] [place] [place]  [place]  [place] [address]  Fax: [phone]

## 2010-07-29 NOTE — Op Note (Signed)
[place]. [facility]  Patient:    Susan Woods, Susan Woods                     MRN: [id number] Proc. Date: 08/13/00 Attending:  Molly Maduro L. Lyman Bishop, M.D.                           Operative Report  PREOPERATIVE DIAGNOSES: 1. Chronic pansinusitis. 2. Deviated nasal septum. 3. Nasal turbinate hypertrophy.  POSTOPERATIVE DIAGNOSES: 1. Chronic pansinusitis. 2. Deviated nasal septum. 3. Nasal turbinate hypertrophy.  PROCEDURES: 1. Nasal septoplasty. 2. Bilateral submucous resection, inferior nasal turbinates. 3. Bilateral endoscopic ethmoidectomy. 4. Bilateral endoscopic maxillary antrostomy.  SURGEON:  Robert L. Lyman Bishop, M.D.  ANESTHESIA:  General.  INDICATION FOR PROCEDURE:  This patient had a long-standing history of chronic recurring sinusitis, nasal obstruction, postnasal drainage.  Examination showed bilaterally enlarged inferior nasal turbinates, markedly thickened nasal septum superiorly, and CT scan showed chronic thickening in both ethmoid and maxillary sinuses with partial obstruction of the osteomeatal complexes. This persisted despite longterm antibiotic steroid inhalant treatment, and patient is admitted for surgery.  DESCRIPTION OF PROCEDURE:  After satisfactory general endotracheal anesthesia had been induced, topical epinephrine packs were placed intranasally, after which both middle and inferior turbinates and the nasal septum and the anterolateral nasal wall were infiltrated with 1% Xylocaine containing 1:100,000 epinephrine for hemostasis, using a total of 7 cc.  After sterile drapes were then applied, examination showed the septum to be straight and midline inferiorly but quite thickened superiorly, obscuring good visualization of the middle meatus on each side.  Both inferior turbinates were quite enlarged and projected toward the midline.  An incision was made over the anterior end of each inferior nasal turbinate, the  mucoperiosteum elevated, and excess turbinate bone was removed.  The remainder was crushed and lateralized, and the incision on each side was closed with running 5-0 chromic gut.  An incision was made in the left side of the nasal septum superiorly just anterior to the thickening of the nasal septum and the mucoperichondrium and periosteum elevated.  The bony-cartilaginous junction was separated, and the flaps were elevated on the right side and the thickened portion of the perpendicular plate and a small portion of the cartilage was then excised with the Jansen-Middleton bone-biting forceps.  This afforded much better visualization of the middle meatus on each side.  The incision was closed with running 5-0 chromic gut, and the flaps were reapproximated with two mattress sutures of 4-0 plain gut.  Using the 0 degree endoscope and the microdebrider, examination showed polyps in both middle meatus, and an ethmoidectomy was carried out on the left side initially, removing extensive polyps.  The ground lamella of the middle turbinate was penetrated, and the posterior ethmoid cells were opened up, and there was thickening but no polyps in the posterior ethmoid cells, but these were opened up thoroughly, and the maxillary ostium, which was surrounded with polypoid, thickened mucosa, was opened up widely into the anterior fontanelle, after which examination through the large antrostomy showed thickened but otherwise unremarkable mucosa. There were some polyps on the medial wall of the maxillary sinus adjacent to the ostium.  A similar procedure was carried out on the right side, again with extensive polyps in the anterior ethmoid cells but only thickening posteriorly, but there were no polyps on the medial wall of the right maxillary sinus.  The middle turbinate on each side was preserved, and a  folded strip of gauze impregnated with Cortisporin ointment was placed in each ethmoid cavity, and  each side of the nose was then packed with a folded strip of Telfa gauze, again coated with Cortisporin ointment.  The patient was thoroughly suctioned.  Estimated blood loss 50 cc.  Patient tolerated the procedure well, was awakened from anesthesia and taken to the recovery room in satisfactory condition. DD:  08/13/00 TD:  08/13/00 Job: [id number]

## 2010-08-02 ENCOUNTER — Encounter: Payer: Federal, State, Local not specified - PPO | Admitting: Family Medicine

## 2010-09-26 ENCOUNTER — Encounter: Payer: Self-pay | Admitting: Cardiology

## 2010-12-29 ENCOUNTER — Encounter: Payer: Self-pay | Admitting: Family Medicine

## 2011-01-03 ENCOUNTER — Encounter: Payer: Self-pay | Admitting: *Deleted

## 2011-01-27 ENCOUNTER — Other Ambulatory Visit: Payer: Self-pay | Admitting: *Deleted

## 2011-01-27 MED ORDER — AMLODIPINE BESYLATE 10 MG PO TABS: 10.0000 mg | ORAL_TABLET | Freq: Every day | ORAL | Status: DC

## 2011-02-15 ENCOUNTER — Ambulatory Visit: Payer: Federal, State, Local not specified - PPO | Admitting: Family Medicine

## 2011-02-20 ENCOUNTER — Ambulatory Visit (INDEPENDENT_AMBULATORY_CARE_PROVIDER_SITE_OTHER): Payer: Federal, State, Local not specified - PPO | Admitting: Family Medicine

## 2011-02-20 ENCOUNTER — Encounter: Payer: Self-pay | Admitting: Family Medicine

## 2011-02-20 VITALS — BP 140/90 | HR 76 | Temp 98.1°F | Ht 64.25 in | Wt 148.0 lb

## 2011-02-20 DIAGNOSIS — Z0000 Encounter for general adult medical examination without abnormal findings: Secondary | ICD-10-CM

## 2011-02-20 DIAGNOSIS — Z23 Encounter for immunization: Secondary | ICD-10-CM

## 2011-02-20 DIAGNOSIS — M899 Disorder of bone, unspecified: Secondary | ICD-10-CM

## 2011-02-20 DIAGNOSIS — M949 Disorder of cartilage, unspecified: Secondary | ICD-10-CM

## 2011-02-20 DIAGNOSIS — I10 Essential (primary) hypertension: Secondary | ICD-10-CM

## 2011-02-20 LAB — LIPID PANEL
HDL: 53.6 mg/dL (ref >39.00)
Total CHOL/HDL Ratio: 3

## 2011-02-20 LAB — TSH: TSH: 0.47 u[IU]/mL (ref 0.35–5.50)

## 2011-02-20 LAB — CBC WITH DIFFERENTIAL/PLATELET
Eosinophils Absolute: 0.1 10*3/uL (ref 0.0–0.7)
Eosinophils Relative: 1.4 % (ref 0.0–5.0)
MCV: 88.2 fl (ref 78.0–100.0)
Monocytes Absolute: 0.4 10*3/uL (ref 0.1–1.0)
Neutrophils Relative %: 60.3 % (ref 43.0–77.0)
Platelets: 220 10*3/uL (ref 150.0–400.0)
WBC: 7.4 10*3/uL (ref 4.5–10.5)

## 2011-02-20 LAB — COMPREHENSIVE METABOLIC PANEL
AST: 21 U/L (ref 0–37)
Albumin: 4.3 g/dL (ref 3.5–5.2)
Alkaline Phosphatase: 73 U/L (ref 39–117)
Glucose, Bld: 88 mg/dL (ref 70–99)
Potassium: 4.3 mEq/L (ref 3.5–5.1)
Sodium: 140 mEq/L (ref 135–145)
Total Protein: 7.5 g/dL (ref 6.0–8.3)

## 2011-02-20 MED ORDER — FLUTICASONE PROPIONATE 50 MCG/ACT NA SUSP: 2.0000 | Freq: Every day | NASAL | Status: DC

## 2011-02-20 MED ORDER — LEVOCETIRIZINE DIHYDROCHLORIDE 5 MG PO TABS: 5.0000 mg | ORAL_TABLET | Freq: Every day | ORAL | Status: DC | PRN

## 2011-02-20 MED ORDER — METOPROLOL SUCCINATE ER 100 MG PO TB24: 100.0000 mg | ORAL_TABLET | Freq: Every day | ORAL | Status: DC

## 2011-02-20 MED ORDER — AMLODIPINE BESYLATE 10 MG PO TABS: 10.0000 mg | ORAL_TABLET | Freq: Every day | ORAL | Status: DC

## 2011-02-20 NOTE — Assessment & Plan Note (Signed)
Some whitecoat component but overall doing well on beta blocker bp in fair control at this time  No changes needed  Disc lifstyle change with low sodium diet and exercise   F/u 6 mo

## 2011-02-20 NOTE — Assessment & Plan Note (Signed)
Rev ca and D  Will check on last dexa at 6 mo f/u Good for exercise  Safety discussed

## 2011-02-20 NOTE — Assessment & Plan Note (Signed)
Reviewed health habits including diet and exercise and skin cancer prevention Also reviewed health mt list, fam hx and immunizations   Labs today Flu shot today   

## 2011-02-20 NOTE — Progress Notes (Signed)
Subjective:    Patient ID: Susan Woods, female    DOB: Nov 23, 1945, 65 y.o.   MRN: [id number]  HPI Here for health maintenance exam and to review chronic medical problems   Is feeling great !- happy about that  No problems at all  Walking lots for exercise and loves it   Wt is up 3 lb with bmi of 25 Eats a healthy diet too    Due for labs   Osteopenia Ca and D dexa 09  hysterect total Tubal preg -- partial and then oophrect  No abn paps   Mam 11/12 - was normal  Self exam - no lumps   Colon screen 1/06 Nothing wrong - so 10 year recall   Zoster status - has not had the vaccine   Flu - has not had one - will do that  Td 08  bp is   140/90   Today  (has whitecoat htn-- wase 116/70 this am )- is always good at home with regular cuff  No cp or palpitations or headaches or edema  No side effects to medicines    Patient Active Problem List  Diagnoses  . MENINGIOMA  . HYPOGLYCEMIA  . ANEMIA  . HYPERTENSION  . TIA  . ALLERGIC RHINITIS  . GERD  . OSTEOPENIA  . PRURITUS  . CHEST PAIN-PRECORDIAL  . Routine general medical examination at a health care facility   Past Medical History  Diagnosis Date  . Nonallergic rhinitis   . GERD (gastroesophageal reflux disease)   . HTN (hypertension)     echo (8/11): EF 55%, mild MR, mild TR  . Osteoporosis   . Anemia   . Meningioma     small; advised no f/u unless symptomatic by Dr. Kemper Durie 8/11  . Tingling     in hands of ? etiol  . Nasal pruritis     full body w/o rash   . Psoriasis     mild intermittent    Past Surgical History  Procedure Date  . Cesarean section   . Vesicovaginal fistula closure w/ tah   . Oophorectomy   . Nasal sinus surgery 2002  . Abdominal surgery     adhesions  . Colonoscopy 2006  . Dexa - osteopenia 2005    dexa 2009 - osteoporosis/fairly stable   . Orif finger / thumb fracture 12/06  . Urethral stricutre     dilated  . Hosp tia 8/11  . Small meningioma   . 2d echo  8/11    nml   . Abdominal hysterectomy    History  Substance Use Topics  . Smoking status: Former Games developer  . Smokeless tobacco: Not on file  . Alcohol Use: Not on file   Family History  Problem Relation Age of Onset  . Cancer Father     throat CA  . Diabetes Father   . Heart disease Mother     MI late 57s - CHF   . Hypertension Mother   . Stroke Brother   . Diabetes Brother   . Heart disease Sister     CHF  . Diabetes Sister   . Hypertension Brother   . Diabetes Brother    Allergies  Allergen Reactions  . Amlodipine Besylate     REACTION: itching  . Indomethacin     REACTION: swelling, rash  . Lisinopril     REACTION: itching  . Olmesartan Medoxomil     REACTION: ? itching also does not work well  .  Penicillins     REACTION: urticaria (hives)  . Sulfonamide Derivatives     REACTION: tingling around mouth and hands felt tight   Current Outpatient Prescriptions on File Prior to Visit  Medication Sig Dispense Refill  . Calcium Carbonate-Vit D-Min 1200-1000 MG-UNIT CHEW Chew by mouth as directed. Unsure of dosage       . Cholecalciferol (VITAMIN D3) 1000 UNITS CAPS Take 1 capsule by mouth daily.        . Ferrous Sulfate (FE-CAPS) 250 MG CPCR Take 1 capsule by mouth daily.        . fish oil-omega-3 fatty acids 1000 MG capsule Take 2 g by mouth every other day. Alternate with flax oil       . Flaxseed, Linseed, (FLAX SEED OIL PO) Take by mouth as directed. Alternate with fish oil       . fluocinolone (DERMA-SMOOTHE/FS BODY) 0.01 % external oil Apply topically daily. Can leave on overnight if needed       . Multiple Vitamin (MULTIVITAMIN) tablet Take 1 tablet by mouth daily.        . NON FORMULARY similisan dry eye drops and ear drops. UAD PRN       . vitamin E 100 UNIT capsule Take 100 Units by mouth daily. Unsure of dosage       . cyclobenzaprine (FLEXERIL) 10 MG tablet Take 5-10 mg by mouth 3 (three) times daily as needed.        . Olopatadine HCl (PATADAY) 0.2 % SOLN  Apply 1 drop to eye daily.            Review of Systems Review of Systems  Constitutional: Negative for fever, appetite change, fatigue and unexpected weight change.  Eyes: Negative for pain and visual disturbance.  Respiratory: Negative for cough and shortness of breath.   Cardiovascular: Negative for cp or palpitations    Gastrointestinal: Negative for nausea, diarrhea and constipation.  Genitourinary: Negative for urgency and frequency.  Skin: Negative for pallor or rash   Neurological: Negative for weakness, light-headedness, numbness and headaches.  Hematological: Negative for adenopathy. Does not bruise/bleed easily.  Psychiatric/Behavioral: Negative for dysphoric mood. The patient is not nervous/anxious.          Objective:   Physical Exam  Constitutional: She appears well-developed and well-nourished. No distress.  HENT:  Head: Normocephalic and atraumatic.  Right Ear: External ear normal.  Left Ear: External ear normal.  Nose: Nose normal.  Mouth/Throat: Oropharynx is clear and moist.  Eyes: Conjunctivae and EOM are normal. Pupils are equal, round, and reactive to light. No scleral icterus.  Neck: Normal range of motion. Neck supple. No JVD present. Carotid bruit is not present. No thyromegaly present.  Cardiovascular: Normal rate, regular rhythm, normal heart sounds and intact distal pulses.  Exam reveals no gallop.   Pulmonary/Chest: Breath sounds normal. No respiratory distress. She has no wheezes.  Abdominal: Soft. Bowel sounds are normal. She exhibits no distension, no abdominal bruit and no mass. There is no tenderness.  Genitourinary: No breast swelling, tenderness, discharge or bleeding.       Breast exam: No mass, nodules, thickening, tenderness, bulging, retraction, inflamation, nipple discharge or skin changes noted.  No axillary or clavicular LA.  Chaperoned exam.    Musculoskeletal: Normal range of motion. She exhibits no edema and no tenderness.    Lymphadenopathy:    She has no cervical adenopathy.  Neurological: She is alert. She has normal reflexes. No cranial nerve deficit. Coordination normal.  Skin: Skin is warm and dry. No rash noted. No erythema. No pallor.  Psychiatric: She has a normal mood and affect.          Assessment & Plan:

## 2011-02-20 NOTE — Patient Instructions (Addendum)
Labs today Flu shot today If you are interested in shingles vaccine in future - call your insurance company to see how coverage is and call us to schedule  I sent px to your pharmacy  Follow up in 6 months for your blood pressure

## 2011-02-21 LAB — VITAMIN D 25 HYDROXY (VIT D DEFICIENCY, FRACTURES): Vit D, 25-Hydroxy: 39 ng/mL (ref 30–89)

## 2011-03-23 ENCOUNTER — Ambulatory Visit: Payer: Federal, State, Local not specified - PPO

## 2011-03-24 ENCOUNTER — Ambulatory Visit (INDEPENDENT_AMBULATORY_CARE_PROVIDER_SITE_OTHER): Payer: Federal, State, Local not specified - PPO | Admitting: *Deleted

## 2011-03-24 DIAGNOSIS — Z23 Encounter for immunization: Secondary | ICD-10-CM

## 2011-03-24 DIAGNOSIS — Z2911 Encounter for prophylactic immunotherapy for respiratory syncytial virus (RSV): Secondary | ICD-10-CM

## 2011-05-24 ENCOUNTER — Telehealth: Payer: Self-pay | Admitting: Family Medicine

## 2011-05-24 NOTE — Telephone Encounter (Signed)
Triage Record Num: [id number] Operator: Jeraldine Loots Patient Name: Susan Woods Call Date & Time: 05/24/2011 2:04:13PM Patient Phone: [phone] PCP: Audrie Gallus. Tower Patient Gender: Female PCP Fax : Patient DOB: October 22, 1945 Practice Name: [facility] Reason for Call: Caller: [name]/Patient; PCP: Roxy Manns A.; CB#: [phone]; Pt. is going on a mission trip to the [place] and needs to know if she can get her travel shots and scripts at the office. She will be leaving 7/17-7/25. Needs Typhoid, Hep A injections. Needs to know about her tetanus status and does she need Hep B vaccine? Pleaase let her know when to come in to get the immunizations. Please call at home or on her cell [phone]. Protocol(s) Used: Office Note Recommended Outcome per Protocol: Information Noted and Sent to Office Reason for Outcome: Caller information to office Care Advice: ~ 05/24/2011 2:08:26PM Page 1 of 1 [id number]

## 2011-05-24 NOTE — Telephone Encounter (Signed)
tetnus utd from 08 No hep B noted No hep A noted Cannot get typhoid here  It may be better for her to go to the [facility] dept travel clinic to get all

## 2011-05-24 NOTE — Telephone Encounter (Signed)
Patient notified as instructed by telephone. Pt said she would go to health clinic.

## 2011-05-26 ENCOUNTER — Telehealth: Payer: Self-pay | Admitting: *Deleted

## 2011-05-26 NOTE — Telephone Encounter (Signed)
Patient called to request a copy of immunization records.  She stated that she is going on a mission trip and needs this information.

## 2011-05-26 NOTE — Telephone Encounter (Signed)
Patient notified as instructed by telephone. Copy of immunization record at front desk for pick up.

## 2011-06-20 ENCOUNTER — Ambulatory Visit (INDEPENDENT_AMBULATORY_CARE_PROVIDER_SITE_OTHER): Payer: Federal, State, Local not specified - PPO | Admitting: Family Medicine

## 2011-06-20 ENCOUNTER — Encounter: Payer: Self-pay | Admitting: Family Medicine

## 2011-06-20 DIAGNOSIS — J019 Acute sinusitis, unspecified: Secondary | ICD-10-CM | POA: Insufficient documentation

## 2011-06-20 MED ORDER — LEVOFLOXACIN 500 MG PO TABS: 500.0000 mg | ORAL_TABLET | Freq: Every day | ORAL | Status: AC

## 2011-06-20 NOTE — Patient Instructions (Addendum)
Take the levaquin for sinus infection  Drink lots of water and also use nasal saline as often as you want to  Also breathe steam  mucinex over the counter can loosen congestion also  Stay on your allergy medicines  Update if not starting to improve in a week or if worsening

## 2011-06-20 NOTE — Progress Notes (Signed)
Subjective:    Patient ID: Susan Woods, female    DOB: November 24, 1945, 66 y.o.   MRN: [id number]  HPI Is here with a sore throat and a lot of drainage (kept her up last night - a little better today)  Post nasal drip is choking her a bit  Some blood from her nose too   No fever now  At home has had some chills/ aches for the past few days  A little bit of cough   Mucous is yellow/ green   Is also having bad sinus pain and headache - under eyes and back of her head   This is allergy time for her   Takes xyzal daily  Is using flonase  Also gargled with peroxide   Patient Active Problem List  Diagnoses  . MENINGIOMA  . HYPOGLYCEMIA  . ANEMIA  . HYPERTENSION  . TIA  . ALLERGIC RHINITIS  . GERD  . OSTEOPENIA  . PRURITUS  . CHEST PAIN-PRECORDIAL  . Routine general medical examination at a health care facility  . Acute sinusitis   Past Medical History  Diagnosis Date  . Nonallergic rhinitis   . GERD (gastroesophageal reflux disease)   . HTN (hypertension)     echo (8/11): EF 55%, mild MR, mild TR  . Osteoporosis   . Anemia   . Meningioma     small; advised no f/u unless symptomatic by Dr. Kemper Durie 8/11  . Tingling     in hands of ? etiol  . Nasal pruritis     full body w/o rash   . Psoriasis     mild intermittent    Past Surgical History  Procedure Date  . Cesarean section   . Vesicovaginal fistula closure w/ tah   . Oophorectomy   . Nasal sinus surgery 2002  . Abdominal surgery     adhesions  . Colonoscopy 2006  . Dexa - osteopenia 2005    dexa 2009 - osteoporosis/fairly stable   . Orif finger / thumb fracture 12/06  . Urethral stricutre     dilated  . Hosp tia 8/11  . Small meningioma   . 2d echo 8/11    nml   . Abdominal hysterectomy    History  Substance Use Topics  . Smoking status: Former Games developer  . Smokeless tobacco: Not on file  . Alcohol Use: Not on file   Family History  Problem Relation Age of Onset  . Cancer Father    throat CA  . Diabetes Father   . Heart disease Mother     MI late 69s - CHF   . Hypertension Mother   . Stroke Brother   . Diabetes Brother   . Heart disease Sister     CHF  . Diabetes Sister   . Hypertension Brother   . Diabetes Brother    Allergies  Allergen Reactions  . Amlodipine Besylate     REACTION: itching  . Indomethacin     REACTION: swelling, rash  . Lisinopril     REACTION: itching  . Olmesartan Medoxomil     REACTION: ? itching also does not work well  . Penicillins     REACTION: urticaria (hives)  . Sulfonamide Derivatives     REACTION: tingling around mouth and hands felt tight   Current Outpatient Prescriptions on File Prior to Visit  Medication Sig Dispense Refill  . amLODipine (NORVASC) 10 MG tablet Take 1 tablet (10 mg total) by mouth daily.  90 tablet  3  . Calcium Carbonate-Vit D-Min 1200-1000 MG-UNIT CHEW Chew by mouth as directed. Unsure of dosage       . Cholecalciferol (VITAMIN D3) 1000 UNITS CAPS Take 1 capsule by mouth daily.        . cyclobenzaprine (FLEXERIL) 10 MG tablet Take 5-10 mg by mouth 3 (three) times daily as needed.        Marland Kitchen ECHINACEA PO Take 1 tablet by mouth 2 (two) times a week.        . Ferrous Sulfate (FE-CAPS) 250 MG CPCR Take 1 capsule by mouth daily.        . fish oil-omega-3 fatty acids 1000 MG capsule Take 2 g by mouth every other day. Alternate with flax oil       . Flaxseed, Linseed, (FLAX SEED OIL PO) Take by mouth as directed. Alternate with fish oil       . fluocinolone (DERMA-SMOOTHE/FS BODY) 0.01 % external oil Apply topically daily. Can leave on overnight if needed       . fluticasone (FLONASE) 50 MCG/ACT nasal spray Place 2 sprays into the nose daily.  48 g  3  . levocetirizine (XYZAL) 5 MG tablet Take 1 tablet (5 mg total) by mouth daily as needed.  90 tablet  3  . metoprolol (TOPROL-XL) 100 MG 24 hr tablet Take 1 tablet (100 mg total) by mouth daily.  90 tablet  3  . Multiple Vitamin (MULTIVITAMIN) tablet Take 1  tablet by mouth daily.        . NON FORMULARY similisan dry eye drops and ear drops. UAD PRN       . Olopatadine HCl (PATADAY) 0.2 % SOLN Apply 1 drop to eye daily.        . vitamin E 100 UNIT capsule Take 100 Units by mouth daily. Unsure of dosage           Review of Systems Review of Systems  Constitutional: Negative for fever, appetite change and unexpected weight change.  ENt pos for congestion and facial pain and purulent nasal drainage Eyes: Negative for pain and visual disturbance.  Respiratory: Negative for cough and shortness of breath.   Cardiovascular: Negative for cp or palpitations    Gastrointestinal: Negative for nausea, diarrhea and constipation.  Genitourinary: Negative for urgency and frequency.  Skin: Negative for pallor or rash   Neurological: Negative for weakness, light-headedness, numbness and headaches.  Hematological: Negative for adenopathy. Does not bruise/bleed easily.  Psychiatric/Behavioral: Negative for dysphoric mood. The patient is not nervous/anxious.         Objective:   Physical Exam  Constitutional: She appears well-developed and well-nourished. No distress.  HENT:  Head: Normocephalic and atraumatic.  Right Ear: External ear normal.  Left Ear: External ear normal.  Mouth/Throat: Oropharynx is clear and moist. No oropharyngeal exudate.       Nares are injected and congested  Bilateral maxillary sinus tenderness Post nasal drip  Eyes: Conjunctivae and EOM are normal. Pupils are equal, round, and reactive to light. Right eye exhibits no discharge. Left eye exhibits no discharge.  Neck: Normal range of motion. Neck supple. No JVD present. No thyromegaly present.  Cardiovascular: Normal rate, regular rhythm and normal heart sounds.   Pulmonary/Chest: Effort normal and breath sounds normal. No respiratory distress. She has no wheezes.  Lymphadenopathy:    She has no cervical adenopathy.  Neurological: She is alert.  Skin: Skin is warm and dry.  No rash noted.  Psychiatric: She has a normal  mood and affect.          Assessment & Plan:

## 2011-06-20 NOTE — Assessment & Plan Note (Signed)
Bacterial- primarily maxillary with pain/ fever/ purulent drainage in allergy sufferer Will tx with levaquin Disc symptomatic care - see instructions on AVS  Update if not starting to improve in a week or if worsening

## 2011-06-27 ENCOUNTER — Telehealth: Payer: Self-pay | Admitting: Family Medicine

## 2011-06-27 NOTE — Telephone Encounter (Signed)
Left message on machine at home for patient to return call. 

## 2011-06-27 NOTE — Telephone Encounter (Signed)
Noted on allergy list, patient advised as instructed via telephone.

## 2011-06-27 NOTE — Telephone Encounter (Signed)
Please note intolerance to med - rxn- "seeing spots" If symptoms persist please follow up for eval

## 2011-06-27 NOTE — Telephone Encounter (Signed)
That was not clear what side effect she was having?

## 2011-06-27 NOTE — Telephone Encounter (Signed)
Triage Record Num: [id number] Operator: Albertine Grates Patient Name: Susan Woods Call Date & Time: 06/26/2011 6:45:42PM Patient Phone: [phone] PCP: Audrie Gallus. Tower Patient Gender: Female PCP Fax : Patient DOB: 02-26-1946 Practice Name: [facility] Reason for Call: Caller: Daylen/Patient is calling with a question about Lebofloxacin 500mg .The medication was written by Roxy Manns A.. Has been taking Levofloxacin since 4-9. Has noticed on med that says this is side effect of medicine. Has 1 pill remaining. Is taking for sinusitis. Is not seeing spots at time of call. Advised seeing MD 4-16 but refuses. States will take remaining tab and follow up if continues. Protocol(s) Used: Eye: Pain or Vision Change Recommended Outcome per Protocol: See Provider within 24 hours Reason for Outcome: Change in vision after beginning new prescription, nonprescription or complementary/alternative medications Care Advice: ~ 06/26/2011 6:53:59PM Page 1 of 1 [email or web address]

## 2011-06-27 NOTE — Telephone Encounter (Signed)
Spoke with patient and she stated that she was seeing spots and that is the only side effect she was having.  She took her last pill today and is doing fine.  She just wanted [place] to make a note in her chart.

## 2011-09-11 ENCOUNTER — Encounter: Payer: Self-pay | Admitting: Family Medicine

## 2011-09-11 ENCOUNTER — Ambulatory Visit (INDEPENDENT_AMBULATORY_CARE_PROVIDER_SITE_OTHER): Payer: Federal, State, Local not specified - PPO | Admitting: Family Medicine

## 2011-09-11 VITALS — BP 142/93 | HR 71 | Temp 98.0°F | Ht 65.5 in | Wt 147.8 lb

## 2011-09-11 DIAGNOSIS — B9689 Other specified bacterial agents as the cause of diseases classified elsewhere: Secondary | ICD-10-CM

## 2011-09-11 DIAGNOSIS — J40 Bronchitis, not specified as acute or chronic: Secondary | ICD-10-CM | POA: Insufficient documentation

## 2011-09-11 DIAGNOSIS — J019 Acute sinusitis, unspecified: Secondary | ICD-10-CM

## 2011-09-11 MED ORDER — AZITHROMYCIN 250 MG PO TABS: ORAL_TABLET | ORAL | Status: AC

## 2011-09-11 NOTE — Progress Notes (Signed)
Subjective:    Patient ID: Susan Woods, female    DOB: 1945-08-09, 66 y.o.   MRN: [id number]  HPI Here for ear pain  R one started Friday followed by left  Then used otc ear drops from [facility] ?hyaline - for "ear ache"  Some sinus congestion  Dark green sinus drainage and also chest congestion too  Coughing   (had dull chest soreness last week before it started)  No fever   She is flying out of the country soon  Patient Active Problem List  Diagnosis  . MENINGIOMA  . HYPOGLYCEMIA  . ANEMIA  . HYPERTENSION  . TIA  . ALLERGIC RHINITIS  . GERD  . OSTEOPENIA  . PRURITUS  . CHEST PAIN-PRECORDIAL  . Routine general medical examination at a health care facility  . Acute sinusitis   Past Medical History  Diagnosis Date  . Nonallergic rhinitis   . GERD (gastroesophageal reflux disease)   . HTN (hypertension)     echo (8/11): EF 55%, mild MR, mild TR  . Osteoporosis   . Anemia   . Meningioma     small; advised no f/u unless symptomatic by Dr. Kemper Durie 8/11  . Tingling     in hands of ? etiol  . Nasal pruritis     full body w/o rash   . Psoriasis     mild intermittent    Past Surgical History  Procedure Date  . Cesarean section   . Vesicovaginal fistula closure w/ tah   . Oophorectomy   . Nasal sinus surgery 2002  . Abdominal surgery     adhesions  . Colonoscopy 2006  . Dexa - osteopenia 2005    dexa 2009 - osteoporosis/fairly stable   . Orif finger / thumb fracture 12/06  . Urethral stricutre     dilated  . Hosp tia 8/11  . Small meningioma   . 2d echo 8/11    nml   . Abdominal hysterectomy    History  Substance Use Topics  . Smoking status: Former Games developer  . Smokeless tobacco: Not on file  . Alcohol Use: Yes     rarely   Family History  Problem Relation Age of Onset  . Cancer Father     throat CA  . Diabetes Father   . Heart disease Mother     MI late 64s - CHF   . Hypertension Mother   . Stroke Brother   . Diabetes Brother   . Heart  disease Sister     CHF  . Diabetes Sister   . Hypertension Brother   . Diabetes Brother    Allergies  Allergen Reactions  . Amlodipine Besylate     REACTION: itching  . Indomethacin     REACTION: swelling, rash  . Levofloxacin Other (See Comments)    Sees spots  . Lisinopril     REACTION: itching  . Olmesartan Medoxomil     REACTION: ? itching also does not work well  . Penicillins     REACTION: urticaria (hives)  . Sulfonamide Derivatives     REACTION: tingling around mouth and hands felt tight   Current Outpatient Prescriptions on File Prior to Visit  Medication Sig Dispense Refill  . amLODipine (NORVASC) 10 MG tablet Take 1 tablet (10 mg total) by mouth daily.  90 tablet  3  . Calcium Carbonate-Vit D-Min 1200-1000 MG-UNIT CHEW Chew by mouth as directed. Unsure of dosage       . Cholecalciferol (VITAMIN D3)  1000 UNITS CAPS Take 1 capsule by mouth daily.        . cyclobenzaprine (FLEXERIL) 10 MG tablet Take 5-10 mg by mouth 3 (three) times daily as needed.        Marland Kitchen ECHINACEA PO Take 1 tablet by mouth 2 (two) times a week.       . Ferrous Sulfate (FE-CAPS) 250 MG CPCR Take 1 capsule by mouth daily.        . fish oil-omega-3 fatty acids 1000 MG capsule Take 2 g by mouth every other day. Alternate with flax oil       . Flaxseed, Linseed, (FLAX SEED OIL PO) Take by mouth as directed. Alternate with fish oil       . fluocinolone (DERMA-SMOOTHE/FS BODY) 0.01 % external oil Apply topically daily. Can leave on overnight if needed       . fluticasone (FLONASE) 50 MCG/ACT nasal spray Place 2 sprays into the nose daily.  48 g  3  . levocetirizine (XYZAL) 5 MG tablet Take 1 tablet (5 mg total) by mouth daily as needed.  90 tablet  3  . metoprolol (TOPROL-XL) 100 MG 24 hr tablet Take 1 tablet (100 mg total) by mouth daily.  90 tablet  3  . Multiple Vitamin (MULTIVITAMIN) tablet Take 1 tablet by mouth daily.        . NON FORMULARY similisan dry eye drops and ear drops. UAD PRN       .  Olopatadine HCl (PATADAY) 0.2 % SOLN Apply 1 drop to eye daily.        . vitamin E 100 UNIT capsule Take 100 Units by mouth daily. Unsure of dosage            Review of Systems Review of Systems  Constitutional: Negative for fever, appetite change,  and unexpected weight change.  ENT pos for sinus pressure/ cong/ ear pain  Eyes: Negative for pain and visual disturbance.  Respiratory: Negative for cough and shortness of breath.   Cardiovascular: Negative for cp or palpitations    Gastrointestinal: Negative for nausea, diarrhea and constipation.  Genitourinary: Negative for urgency and frequency.  Skin: Negative for pallor or rash   Neurological: Negative for weakness, light-headedness, numbness and headaches.  Hematological: Negative for adenopathy. Does not bruise/bleed easily.  Psychiatric/Behavioral: Negative for dysphoric mood. The patient is not nervous/anxious.         Objective:   Physical Exam  Constitutional: She appears well-developed and well-nourished. No distress.  HENT:  Head: Normocephalic and atraumatic.  Mouth/Throat: Oropharynx is clear and moist. No oropharyngeal exudate.       Nares are injected and congested  bilat frontal and maxillary sinus tenderness Post nasal drip TMs are dull with eff bilat-no erythema   Eyes: Conjunctivae and EOM are normal. Pupils are equal, round, and reactive to light. Right eye exhibits no discharge. Left eye exhibits no discharge.  Neck: Normal range of motion. Neck supple.  Cardiovascular: Normal rate and regular rhythm.   Pulmonary/Chest: Effort normal and breath sounds normal. No respiratory distress. She has no wheezes. She has no rales.  Musculoskeletal: She exhibits no edema.  Lymphadenopathy:    She has no cervical adenopathy.  Neurological: She is alert.  Skin: Skin is warm and dry. No rash noted.  Psychiatric: She has a normal mood and affect.          Assessment & Plan:

## 2011-09-11 NOTE — Patient Instructions (Addendum)
Drink lots of water  You can use nasal saline spray for congestion Also plain mucinex  Take the zpak as directed Update if not starting to improve in a week or if worsening

## 2011-09-11 NOTE — Assessment & Plan Note (Signed)
With sinus pain and ETD and prod cough - with upcoming trip Disc symptomatic care - see instructions on AVS  Cover with zpak due to multiple abx allergies Update if not starting to improve in a week or if worsening

## 2011-10-16 ENCOUNTER — Ambulatory Visit (INDEPENDENT_AMBULATORY_CARE_PROVIDER_SITE_OTHER): Payer: Federal, State, Local not specified - PPO | Admitting: Family Medicine

## 2011-10-16 ENCOUNTER — Encounter: Payer: Self-pay | Admitting: Family Medicine

## 2011-10-16 ENCOUNTER — Telehealth: Payer: Self-pay

## 2011-10-16 VITALS — BP 128/84 | HR 86 | Temp 98.0°F | Wt 148.5 lb

## 2011-10-16 DIAGNOSIS — J40 Bronchitis, not specified as acute or chronic: Secondary | ICD-10-CM

## 2011-10-16 DIAGNOSIS — R072 Precordial pain: Secondary | ICD-10-CM

## 2011-10-16 DIAGNOSIS — R079 Chest pain, unspecified: Secondary | ICD-10-CM

## 2011-10-16 MED ORDER — DOXYCYCLINE HYCLATE 100 MG PO CAPS: 100.0000 mg | ORAL_CAPSULE | Freq: Two times a day (BID) | ORAL | Status: AC

## 2011-10-16 NOTE — Patient Instructions (Addendum)
EKG looking good today. I think chest soreness is respiratory in nature.   Take simple mucinex or guaifenesin OTC with plenty of fluid to help mobilize mucous out. Treat any residual bronchitis with course of doxycycline twice daily. If symptoms continued despite this, please return to see [place].

## 2011-10-16 NOTE — Assessment & Plan Note (Addendum)
Continued ETD with serous otitis, productive cough.  However lungs clear, good O2 sat and vitals. Anticipate chest discomfort due to current resp infection. Will treat as such with doxy, recommended mucinex to bring out sputum. Did discuss if continued sxs despite treatment, or any worsening, to seek urgent care.

## 2011-10-16 NOTE — Assessment & Plan Note (Addendum)
Checked EKG - overall stable.  NSR rate 62, normal axis, intervals, no acute ST/T changes If continued sxs, would recommend return for further evaluation. Risk factors include family hx CAD (mother) and personal hx HTN.

## 2011-10-16 NOTE — Telephone Encounter (Signed)
Pt given antibiotic for URI 09/11/11. Still earache, productive cough with yellow mucus. On and off chest discomfort; not sure if related to cough or beta blocker? Last chest discomfort was last night. Pt said "sometimes her jaw feels weird". After walking today BP 115/76 and p 101. Pt request appt does not want to go to UC or ED. Pt scheduled with Dr Sharen Hones today at 9:15am.pt to call back if condition changes or worsens.

## 2011-10-16 NOTE — Telephone Encounter (Signed)
Seen today. 

## 2011-10-16 NOTE — Progress Notes (Signed)
Subjective:    Patient ID: Susan Woods, female    DOB: 07-Jan-1946, 66 y.o.   MRN: [id number]  HPI CC: chest pain, ear pain  Has "uncomfortable feelings in chest", wonders if residual of recent sinus infection.  Describes heaviness/soreness across chest, present since last visit here.  Lasts about a few minutes.  Discomfort not exertional (walks 5 mi daily, no sxs with this), not relieved by rest.  Seems associated with coughing episodes.  Not pleuritic.  Recent mission trip to [place].  Seen here 09/11/2011 with dx bacterial sinusitis that presented with ear pain, treated with zpack.  R ear continues sore, continues with productive cough of mild phlegm.  Mild R frontal/temporal HA and ST.  zpack did seem to help, but sxs recurred about 2 wks after zpack.  No fevers/chills, nausea/vomiting, abd pain, SOB, leg swelling.  Pt quit smoking 1987.  Denies personal hx cardiac problems.  Mother with MI age late 13s.    Lab Results  Component Value Date   CHOL 152 02/20/2011   HDL 53.60 02/20/2011   LDLCALC 86 02/20/2011   TRIG 64.0 02/20/2011   CHOLHDL 3 02/20/2011    Past Medical History  Diagnosis Date  . Nonallergic rhinitis   . GERD (gastroesophageal reflux disease)   . HTN (hypertension)     echo (8/11): EF 55%, mild MR, mild TR  . Osteoporosis   . Anemia   . Meningioma     small; advised no f/u unless symptomatic by Dr. Kemper Durie 8/11  . Tingling     in hands of ? etiol  . Nasal pruritis     full body w/o rash   . Psoriasis     mild intermittent    Family History  Problem Relation Age of Onset  . Cancer Father     throat CA  . Diabetes Father   . Heart disease Mother     MI late 85s - CHF   . Hypertension Mother   . Stroke Brother   . Diabetes Brother   . Heart disease Sister     CHF  . Diabetes Sister   . Hypertension Brother   . Diabetes Brother     Review of Systems Per HPI    Objective:   Physical Exam  Nursing note and vitals  reviewed. Constitutional: She appears well-developed and well-nourished. No distress.  HENT:  Head: Normocephalic and atraumatic.  Right Ear: Hearing, tympanic membrane, external ear and ear canal normal.  Left Ear: Hearing, tympanic membrane, external ear and ear canal normal.  Nose: Mucosal edema present. No rhinorrhea. Right sinus exhibits no maxillary sinus tenderness and no frontal sinus tenderness. Left sinus exhibits no maxillary sinus tenderness and no frontal sinus tenderness.  Mouth/Throat: Uvula is midline, oropharynx is clear and moist and mucous membranes are normal. No oropharyngeal exudate, posterior oropharyngeal edema, posterior oropharyngeal erythema or tonsillar abscesses.       Fluid behind ears bilaterally Dry nasal turbinates  Eyes: Conjunctivae and EOM are normal. Pupils are equal, round, and reactive to light. No scleral icterus.  Neck: Normal range of motion. Neck supple. Carotid bruit is not present. No thyromegaly present.  Cardiovascular: Normal rate, regular rhythm, normal heart sounds and intact distal pulses.   No murmur heard. Pulmonary/Chest: Effort normal and breath sounds normal. No respiratory distress. She has no wheezes. She has no rales.       CP not reproducible, not currently present  Lymphadenopathy:    She has no cervical  adenopathy.  Skin: Skin is warm and dry. No rash noted.       Assessment & Plan:

## 2012-01-01 ENCOUNTER — Encounter: Payer: Self-pay | Admitting: Family Medicine

## 2012-01-02 ENCOUNTER — Encounter: Payer: Self-pay | Admitting: *Deleted

## 2012-02-18 ENCOUNTER — Other Ambulatory Visit: Payer: Self-pay | Admitting: Family Medicine

## 2012-02-19 ENCOUNTER — Ambulatory Visit (INDEPENDENT_AMBULATORY_CARE_PROVIDER_SITE_OTHER): Payer: Federal, State, Local not specified - PPO | Admitting: Family Medicine

## 2012-02-19 ENCOUNTER — Encounter: Payer: Self-pay | Admitting: Family Medicine

## 2012-02-19 VITALS — BP 150/84 | HR 64 | Temp 97.5°F | Wt 147.0 lb

## 2012-02-19 DIAGNOSIS — Z23 Encounter for immunization: Secondary | ICD-10-CM

## 2012-02-19 DIAGNOSIS — R21 Rash and other nonspecific skin eruption: Secondary | ICD-10-CM

## 2012-02-19 MED ORDER — FLUOCINONIDE-E 0.05 % EX CREA
TOPICAL_CREAM | Freq: Two times a day (BID) | CUTANEOUS | Status: DC
Start: 2012-02-19 — End: 2013-04-07

## 2012-02-19 NOTE — Telephone Encounter (Signed)
CPE schedule for 08/19/12 with labs prior and med refilled until then

## 2012-02-19 NOTE — Progress Notes (Signed)
Subjective:    Patient ID: Susan Woods, female    DOB: 1945-09-10, 66 y.o.   MRN: [id number]  HPI  Very pleasant 66 yo pt of Dr. Milinda Antis here for:  Rash on mid back x 2 weeks. She did recently start swimming at a new pool prior to this rash starting. Not painful but it is itchy.  OTC hydrocortisone cream has helped a little.  She did use a new lotion as well. No changes in detergents or other products that she is aware of.  No respiratory symptoms.  Patient Active Problem List  Diagnosis  . MENINGIOMA  . HYPOGLYCEMIA  . ANEMIA  . HYPERTENSION  . TIA  . ALLERGIC RHINITIS  . GERD  . OSTEOPENIA  . PRURITUS  . Chest discomfort  . Routine general medical examination at a health care facility  . Bronchitis   Past Medical History  Diagnosis Date  . Nonallergic rhinitis   . GERD (gastroesophageal reflux disease)   . HTN (hypertension)     echo (8/11): EF 55%, mild MR, mild TR  . Osteoporosis   . Anemia   . Meningioma     small; advised no f/u unless symptomatic by Dr. Kemper Durie 8/11  . Tingling     in hands of ? etiol  . Nasal pruritis     full body w/o rash   . Psoriasis     mild intermittent    Past Surgical History  Procedure Date  . Cesarean section   . Vesicovaginal fistula closure w/ tah   . Oophorectomy   . Nasal sinus surgery 2002  . Abdominal surgery     adhesions  . Colonoscopy 2006  . Dexa - osteopenia 2005    dexa 2009 - osteoporosis/fairly stable   . Orif finger / thumb fracture 12/06  . Urethral stricutre     dilated  . Hosp tia 8/11  . Small meningioma   . 2d echo 8/11    nml   . Abdominal hysterectomy    History  Substance Use Topics  . Smoking status: Former Games developer  . Smokeless tobacco: Not on file  . Alcohol Use: Yes     Comment: rarely   Family History  Problem Relation Age of Onset  . Cancer Father     throat CA  . Diabetes Father   . Heart disease Mother     MI late 31s - CHF   . Hypertension Mother   . Stroke  Brother   . Diabetes Brother   . Heart disease Sister     CHF  . Diabetes Sister   . Hypertension Brother   . Diabetes Brother    Allergies  Allergen Reactions  . Amlodipine Besylate     REACTION: itching  . Indomethacin     REACTION: swelling, rash  . Levofloxacin Other (See Comments)    Sees spots  . Lisinopril     REACTION: itching  . Olmesartan Medoxomil     REACTION: ? itching also does not work well  . Penicillins     REACTION: urticaria (hives)  . Sulfonamide Derivatives     REACTION: tingling around mouth and hands felt tight   Current Outpatient Prescriptions on File Prior to Visit  Medication Sig Dispense Refill  . amLODipine (NORVASC) 10 MG tablet Take 1 tablet (10 mg total) by mouth daily.  90 tablet  3  . Calcium Carbonate-Vit D-Min 1200-1000 MG-UNIT CHEW Chew by mouth as directed. Unsure of dosage       .  Cholecalciferol (VITAMIN D3) 1000 UNITS CAPS Take 1 capsule by mouth daily.        . cyclobenzaprine (FLEXERIL) 10 MG tablet Take 5-10 mg by mouth 3 (three) times daily as needed.        Marland Kitchen ECHINACEA PO Take 1 tablet by mouth 2 (two) times a week.       . Ferrous Sulfate (FE-CAPS) 250 MG CPCR Take 1 capsule by mouth daily.        . fish oil-omega-3 fatty acids 1000 MG capsule Take 2 g by mouth every other day. Alternate with flax oil       . Flaxseed, Linseed, (FLAX SEED OIL PO) Take by mouth as directed. Alternate with fish oil       . fluocinolone (DERMA-SMOOTHE/FS BODY) 0.01 % external oil Apply topically daily. Can leave on overnight if needed       . fluticasone (FLONASE) 50 MCG/ACT nasal spray Place 2 sprays into the nose daily.  48 g  3  . levocetirizine (XYZAL) 5 MG tablet Take 1 tablet (5 mg total) by mouth daily as needed.  90 tablet  3  . metoprolol (TOPROL-XL) 100 MG 24 hr tablet Take 1 tablet (100 mg total) by mouth daily.  90 tablet  3  . Multiple Vitamin (MULTIVITAMIN) tablet Take 1 tablet by mouth daily.        . NON FORMULARY similisan dry eye  drops and ear drops. UAD PRN       . Olopatadine HCl (PATADAY) 0.2 % SOLN Apply 1 drop to eye daily.        . vitamin E 100 UNIT capsule Take 100 Units by mouth daily. Unsure of dosage        The PMH, PSH, Social History, Family History, Medications, and allergies have been reviewed in [facility], and have been updated if relevant.   Review of Systems See HPI    Objective:   Physical Exam BP 150/84  Pulse 64  Temp 97.5 F (36.4 C)  Wt 147 lb (66.679 kg) Gen:  Alert, pleasant, NAD Skin:  Raised, faintly hyperpigmented lesions- middle back, not in dermatomal distribution 3 lesions, largest approx 1.5 cm, no surrounding erythema or drainage.     Assessment & Plan:  1.  Rash- Appears consistent with allergic/contact dermatitis.  Rx for lidex given, advised antihistamines for itching. If no improvement, consider antifungal tx. The patient indicates understanding of these issues and agrees with the plan.

## 2012-02-19 NOTE — Addendum Note (Signed)
Addended by: Eliezer Bottom on: 02/19/2012 09:39 AM   Modules accepted: Orders

## 2012-02-19 NOTE — Telephone Encounter (Signed)
Please set up PE in about 6 mo and refil until then thanks

## 2012-02-19 NOTE — Telephone Encounter (Signed)
No recent CPE or labs, has had a few acute OV, ok to refill?

## 2012-02-19 NOTE — Patient Instructions (Addendum)
It was nice to meet you. I think this an allergic reaction. Use the lidex twice daily until it is gone. For itching, you can try benadryl or zytrec. If not better in 2 weeks, let us know.

## 2012-04-01 ENCOUNTER — Other Ambulatory Visit: Payer: Self-pay | Admitting: Family Medicine

## 2012-06-07 ENCOUNTER — Encounter: Payer: Self-pay | Admitting: Family Medicine

## 2012-06-07 ENCOUNTER — Ambulatory Visit (INDEPENDENT_AMBULATORY_CARE_PROVIDER_SITE_OTHER): Payer: Federal, State, Local not specified - PPO | Admitting: Family Medicine

## 2012-06-07 VITALS — BP 154/92 | HR 64 | Temp 98.3°F | Ht 65.5 in | Wt 143.2 lb

## 2012-06-07 DIAGNOSIS — N949 Unspecified condition associated with female genital organs and menstrual cycle: Secondary | ICD-10-CM

## 2012-06-07 DIAGNOSIS — R102 Pelvic and perineal pain: Secondary | ICD-10-CM

## 2012-06-07 DIAGNOSIS — N342 Other urethritis: Secondary | ICD-10-CM | POA: Insufficient documentation

## 2012-06-07 NOTE — Patient Instructions (Addendum)
Stop using soap or any cleanser on genital area Use water only  Continue estrogen cream as directed  If symptoms return or worsen let me know

## 2012-06-07 NOTE — Assessment & Plan Note (Signed)
Times one- resolved and nl exam except for some urethritis See assessment for that  Update if not improved

## 2012-06-07 NOTE — Progress Notes (Signed)
Subjective:    Patient ID: Susan Woods, female    DOB: 1945/06/22, 67 y.o.   MRN: [id number]  HPI Here for a personal issue   Tuesday - went to exercise - had not started yet - right before she started had a sharp vaginal pain -- very intense and then went away- lasted about a minute  Then worked out on the [employer] fine   Then thought she felt an area of irritation - she sees a pink area - looked like a cyst that popped - and she felt a bit of drainage  Now is less painful and is a little better  No vaginal discharge - in fact more dryness   No new sexual partner  Not active at all  She does use a body wash with a bit of a scent to it   Has a little stress incontinence   Had stomach virus 3 weeks ago - with diarrhea Is getting back to normal with that    Patient Active Problem List  Diagnosis  . MENINGIOMA  . HYPOGLYCEMIA  . ANEMIA  . HYPERTENSION  . TIA  . ALLERGIC RHINITIS  . GERD  . OSTEOPENIA  . PRURITUS  . Chest discomfort  . Routine general medical examination at a health care facility  . Bronchitis  . Vaginal pain   Past Medical History  Diagnosis Date  . Nonallergic rhinitis   . GERD (gastroesophageal reflux disease)   . HTN (hypertension)     echo (8/11): EF 55%, mild MR, mild TR  . Osteoporosis   . Anemia   . Meningioma     small; advised no f/u unless symptomatic by Dr. Kemper Durie 8/11  . Tingling     in hands of ? etiol  . Nasal pruritis     full body w/o rash   . Psoriasis     mild intermittent    Past Surgical History  Procedure Laterality Date  . Cesarean section    . Vesicovaginal fistula closure w/ tah    . Oophorectomy    . Nasal sinus surgery  2002  . Abdominal surgery      adhesions  . Colonoscopy  2006  . Dexa - osteopenia  2005    dexa 2009 - osteoporosis/fairly stable   . Orif finger / thumb fracture  12/06  . Urethral stricutre      dilated  . Hosp tia  8/11  . Small meningioma    . 2d echo  8/11   nml   . Abdominal hysterectomy     History  Substance Use Topics  . Smoking status: Former Games developer  . Smokeless tobacco: Not on file  . Alcohol Use: Yes     Comment: rarely   Family History  Problem Relation Age of Onset  . Cancer Father     throat CA  . Diabetes Father   . Heart disease Mother     MI late 55s - CHF   . Hypertension Mother   . Stroke Brother   . Diabetes Brother   . Heart disease Sister     CHF  . Diabetes Sister   . Hypertension Brother   . Diabetes Brother    Allergies  Allergen Reactions  . Amlodipine Besylate     REACTION: itching  . Indomethacin     REACTION: swelling, rash  . Levofloxacin Other (See Comments)    Sees spots  . Lisinopril     REACTION: itching  .  Olmesartan Medoxomil     REACTION: ? itching also does not work well  . Penicillins     REACTION: urticaria (hives)  . Sulfonamide Derivatives     REACTION: tingling around mouth and hands felt tight   Current Outpatient Prescriptions on File Prior to Visit  Medication Sig Dispense Refill  . amLODipine (NORVASC) 10 MG tablet TAKE 1 TABLET (10 MG TOTAL) BY MOUTH DAILY.  90 tablet  1  . Calcium Carbonate-Vit D-Min 1200-1000 MG-UNIT CHEW Chew by mouth as directed. Unsure of dosage       . Cholecalciferol (VITAMIN D3) 1000 UNITS CAPS Take 1 capsule by mouth daily.        . cyclobenzaprine (FLEXERIL) 10 MG tablet Take 5-10 mg by mouth 3 (three) times daily as needed.        Marland Kitchen ECHINACEA PO Take 1 tablet by mouth 2 (two) times a week.       . Ferrous Sulfate (FE-CAPS) 250 MG CPCR Take 1 capsule by mouth daily.        . fish oil-omega-3 fatty acids 1000 MG capsule Take 2 g by mouth every other day. Alternate with flax oil       . Flaxseed, Linseed, (FLAX SEED OIL PO) Take by mouth as directed. Alternate with fish oil       . fluocinolone (DERMA-SMOOTHE/FS BODY) 0.01 % external oil Apply topically daily. Can leave on overnight if needed       . fluocinonide-emollient (LIDEX-E) 0.05 % cream  Apply topically 2 (two) times daily.  30 g  0  . fluticasone (FLONASE) 50 MCG/ACT nasal spray Place 2 sprays into the nose daily.  48 g  3  . levocetirizine (XYZAL) 5 MG tablet Take 1 tablet (5 mg total) by mouth daily as needed.  90 tablet  3  . metoprolol succinate (TOPROL-XL) 100 MG 24 hr tablet TAKE 1 TABLET (100 MG TOTAL) BY MOUTH DAILY.  90 tablet  1  . Multiple Vitamin (MULTIVITAMIN) tablet Take 1 tablet by mouth daily.        . NON FORMULARY similisan dry eye drops and ear drops. UAD PRN       . Olopatadine HCl (PATADAY) 0.2 % SOLN Apply 1 drop to eye daily.        . vitamin E 100 UNIT capsule Take 100 Units by mouth daily. Unsure of dosage        No current facility-administered medications on file prior to visit.    Review of Systems Review of Systems  Constitutional: Negative for fever, appetite change, fatigue and unexpected weight change.  Eyes: Negative for pain and visual disturbance.  Respiratory: Negative for cough and shortness of breath.   Cardiovascular: Negative for cp or palpitations    Gastrointestinal: Negative for nausea, diarrhea and constipation.  Genitourinary: Negative for urgency and frequency. neg for dysuria , neg for vaginal discharge or pelvic pain  Skin: Negative for pallor or rash   Neurological: Negative for weakness, light-headedness, numbness and headaches.  Hematological: Negative for adenopathy. Does not bruise/bleed easily.  Psychiatric/Behavioral: Negative for dysphoric mood. The patient is not nervous/anxious.         Objective:   Physical Exam  Constitutional: She appears well-developed and well-nourished. No distress.  HENT:  Head: Normocephalic and atraumatic.  Mouth/Throat: Oropharynx is clear and moist.  Eyes: Conjunctivae and EOM are normal. Pupils are equal, round, and reactive to light.  Neck: Normal range of motion. Neck supple.  Cardiovascular: Normal rate, regular  rhythm and normal heart sounds.   Pulmonary/Chest: Effort normal  and breath sounds normal. No respiratory distress. She has no wheezes.  Abdominal: Soft. Bowel sounds are normal. She exhibits no distension and no mass. There is no tenderness.  No suprapubic tenderness or fullness    Genitourinary: Vagina normal and uterus normal. There is no rash, tenderness, lesion or injury on the right labia. There is no rash, tenderness, lesion or injury on the left labia. Uterus is not enlarged and not tender. Cervix exhibits no motion tenderness, no discharge and no friability. No erythema, tenderness or bleeding around the vagina. No vaginal discharge found.  Mild to moderate hyperemia of urethra- without polyp or hypermobility  Lymphadenopathy:    She has no cervical adenopathy.  Skin: Skin is warm and dry. No rash noted.  Psychiatric: She has a normal mood and affect.          Assessment & Plan:

## 2012-06-07 NOTE — Assessment & Plan Note (Signed)
Suspect from a new soap/cleanser she is using at the gym inst to cleanse area with water only and to continue her vaginal estrogen for vaginal dryness Exam unremarkable except for hyperemia of the urethra Will update if not improved

## 2012-07-07 ENCOUNTER — Other Ambulatory Visit: Payer: Self-pay | Admitting: Family Medicine

## 2012-08-11 ENCOUNTER — Telehealth: Payer: Self-pay | Admitting: Family Medicine

## 2012-08-11 DIAGNOSIS — I10 Essential (primary) hypertension: Secondary | ICD-10-CM

## 2012-08-11 DIAGNOSIS — M949 Disorder of cartilage, unspecified: Secondary | ICD-10-CM

## 2012-08-11 DIAGNOSIS — Z0000 Encounter for general adult medical examination without abnormal findings: Secondary | ICD-10-CM

## 2012-08-11 NOTE — Telephone Encounter (Signed)
Message copied by Judy Pimple on Sun Aug 11, 2012  6:27 PM ------      Message from: Alvina Chou      Created: Mon Jul 29, 2012  4:06 PM      Regarding: Lab orders for Monday, 6.2.14       Patient is scheduled for CPX labs, please order future labs, Thanks , Susan Woods       ------

## 2012-08-12 ENCOUNTER — Other Ambulatory Visit (INDEPENDENT_AMBULATORY_CARE_PROVIDER_SITE_OTHER): Payer: Federal, State, Local not specified - PPO

## 2012-08-12 DIAGNOSIS — Z0000 Encounter for general adult medical examination without abnormal findings: Secondary | ICD-10-CM

## 2012-08-12 DIAGNOSIS — M899 Disorder of bone, unspecified: Secondary | ICD-10-CM

## 2012-08-12 LAB — LIPID PANEL
Cholesterol: 141 mg/dL (ref 0–200)
LDL Cholesterol: 70 mg/dL (ref 0–99)
Total CHOL/HDL Ratio: 3
Triglycerides: 92 mg/dL (ref 0.0–149.0)
VLDL: 18.4 mg/dL (ref 0.0–40.0)

## 2012-08-12 LAB — COMPREHENSIVE METABOLIC PANEL
AST: 20 U/L (ref 0–37)
Alkaline Phosphatase: 53 U/L (ref 39–117)
Glucose, Bld: 98 mg/dL (ref 70–99)
Sodium: 139 mEq/L (ref 135–145)
Total Bilirubin: 0.8 mg/dL (ref 0.3–1.2)
Total Protein: 6.7 g/dL (ref 6.0–8.3)

## 2012-08-12 LAB — CBC WITH DIFFERENTIAL/PLATELET
Eosinophils Absolute: 0.2 10*3/uL (ref 0.0–0.7)
Eosinophils Relative: 4.3 % (ref 0.0–5.0)
HCT: 35.8 % — ABNORMAL LOW (ref 36.0–46.0)
Lymphs Abs: 2.1 10*3/uL (ref 0.7–4.0)
MCHC: 34 g/dL (ref 30.0–36.0)
MCV: 87.1 fl (ref 78.0–100.0)
Monocytes Absolute: 0.4 10*3/uL (ref 0.1–1.0)
Neutrophils Relative %: 41.2 % — ABNORMAL LOW (ref 43.0–77.0)
Platelets: 202 10*3/uL (ref 150.0–400.0)
RDW: 15 % — ABNORMAL HIGH (ref 11.5–14.6)
WBC: 4.7 10*3/uL (ref 4.5–10.5)

## 2012-08-13 ENCOUNTER — Telehealth: Payer: Self-pay

## 2012-08-13 MED ORDER — METOPROLOL SUCCINATE ER 100 MG PO TB24: ORAL_TABLET | ORAL | Status: DC

## 2012-08-13 MED ORDER — FLUTICASONE PROPIONATE 50 MCG/ACT NA SUSP: 2.0000 | Freq: Every day | NASAL | Status: DC

## 2012-08-13 MED ORDER — AMLODIPINE BESYLATE 10 MG PO TABS: ORAL_TABLET | ORAL | Status: DC

## 2012-08-13 NOTE — Telephone Encounter (Signed)
Pt called back and does request refills for # 90 on each until come in for CPX.

## 2012-08-13 NOTE — Telephone Encounter (Signed)
Pt left v/m requesting refills on metoprolol, amlodipine and flonase. Left v/m for pt does pt need refills prior to CPX scheduled on 08/19/12. [facility].

## 2012-08-16 ENCOUNTER — Other Ambulatory Visit: Payer: Self-pay | Admitting: Family Medicine

## 2012-08-19 ENCOUNTER — Encounter: Payer: Federal, State, Local not specified - PPO | Admitting: Family Medicine

## 2012-09-03 ENCOUNTER — Telehealth: Payer: Self-pay

## 2012-09-03 NOTE — Telephone Encounter (Signed)
Pt left v/m; pt is Diplomatic Services operational officer; pt has med now so does not need med sent to pharmacy; pt is presently taking metoprolol succinate and wants to know could that be changed to metoprolol tartrate because it is less expensive on new ins. Plan.Please advise.

## 2012-09-10 MED ORDER — METOPROLOL TARTRATE 100 MG PO TABS: 50.0000 mg | ORAL_TABLET | Freq: Two times a day (BID) | ORAL | Status: DC

## 2012-09-10 NOTE — Telephone Encounter (Signed)
Please call in the metoprolol tartrate - let her know this will be taken twice daily (1/2 pill bid) - and f/u in 1-2 mo for bp visit to make sure it is working [employer]

## 2012-09-11 NOTE — Telephone Encounter (Signed)
Pt has enough Rx to last for a few months, and she has a CPE scheduled in Oct, pt just wanted to make sure this was an option because she is switching her insurance to Medicare soon. Pt declined a f/u appt since she isn't changing meds yet and she has a CPE in Oct. Pt will call us when she needs Rx refilled

## 2012-09-11 NOTE — Telephone Encounter (Signed)
In that case advise her to check bp at home (if she can) or at a pharmacy to make sure her bp is well controlled and pulse rate is ok - this medicine is similar but not exactly the same and I want to make sure it is working

## 2012-09-12 NOTE — Telephone Encounter (Signed)
Pt is still on her same med metoprolol succinate she will call once she wants it changed

## 2012-09-23 ENCOUNTER — Telehealth: Payer: Self-pay | Admitting: Family Medicine

## 2012-09-23 DIAGNOSIS — R102 Pelvic and perineal pain: Secondary | ICD-10-CM

## 2012-09-23 NOTE — Telephone Encounter (Signed)
Will refer to gyn- let her know she will get a call re: this

## 2012-09-23 NOTE — Telephone Encounter (Signed)
Pt.notified

## 2012-09-23 NOTE — Telephone Encounter (Signed)
Patient is still having problems with pain in vaginal area and would like to be referred to an OBGYN.

## 2012-10-11 ENCOUNTER — Encounter: Payer: Federal, State, Local not specified - PPO | Admitting: Family Medicine

## 2012-10-18 ENCOUNTER — Ambulatory Visit (INDEPENDENT_AMBULATORY_CARE_PROVIDER_SITE_OTHER): Payer: Medicare Other | Admitting: Obstetrics & Gynecology

## 2012-10-18 ENCOUNTER — Encounter: Payer: Self-pay | Admitting: Obstetrics & Gynecology

## 2012-10-18 VITALS — BP 118/79 | HR 65 | Ht 65.5 in | Wt 148.0 lb

## 2012-10-18 DIAGNOSIS — N952 Postmenopausal atrophic vaginitis: Secondary | ICD-10-CM

## 2012-10-18 NOTE — Progress Notes (Signed)
  Subjective:    Patient ID: Susan Woods, female    DOB: Dec 11, 1945, 67 y.o.   MRN: [id number]  HPI  67 yo S AA abstinent lady who is here because of vaginal irritation. She also described a one time one minute sharp vaginal pain prior to using the elliptical trainer. She is using an OTC vaginal cream that has not helped.  Review of Systems     Objective:   Physical Exam  Severe vaginal atrophy Negative speculum (except for severe atrophy) and bimanual exams     Assessment & Plan:  Symptomatic vaginal atrophy- compounded vaginal estrogen (for $ savings)- 1 gram per vaginal 2-3 times per week RTC 1 year/prn sooner

## 2012-10-18 NOTE — Progress Notes (Signed)
Vaginal pain and irritation.  Recently had to have urethra stretched, which she has had to have in the past.  She has not had a vaginal exam in many years due to hysterectomy and Dr. Milinda Antis just wanted [place] to check things out to be sure all was well.

## 2012-10-31 ENCOUNTER — Telehealth: Payer: Self-pay

## 2012-10-31 ENCOUNTER — Other Ambulatory Visit: Payer: Self-pay | Admitting: *Deleted

## 2012-10-31 MED ORDER — LEVOCETIRIZINE DIHYDROCHLORIDE 5 MG PO TABS: ORAL_TABLET | ORAL | Status: DC

## 2012-10-31 MED ORDER — FLUTICASONE PROPIONATE 50 MCG/ACT NA SUSP: 2.0000 | Freq: Every day | NASAL | Status: DC

## 2012-10-31 MED ORDER — AMLODIPINE BESYLATE 10 MG PO TABS: ORAL_TABLET | ORAL | Status: DC

## 2012-10-31 MED ORDER — METOPROLOL TARTRATE 100 MG PO TABS: 50.0000 mg | ORAL_TABLET | Freq: Two times a day (BID) | ORAL | Status: DC

## 2012-10-31 NOTE — Telephone Encounter (Signed)
Pt left v/m to add [facility] to her list of pharmacies; done. pt does not need refills at this time.

## 2012-12-31 ENCOUNTER — Telehealth: Payer: Self-pay

## 2012-12-31 ENCOUNTER — Encounter: Payer: Self-pay | Admitting: Family Medicine

## 2012-12-31 ENCOUNTER — Ambulatory Visit (INDEPENDENT_AMBULATORY_CARE_PROVIDER_SITE_OTHER): Payer: Medicare Other | Admitting: Family Medicine

## 2012-12-31 VITALS — BP 130/80 | HR 57 | Temp 98.0°F | Ht 64.0 in | Wt 146.5 lb

## 2012-12-31 DIAGNOSIS — Z23 Encounter for immunization: Secondary | ICD-10-CM

## 2012-12-31 DIAGNOSIS — M899 Disorder of bone, unspecified: Secondary | ICD-10-CM

## 2012-12-31 DIAGNOSIS — I10 Essential (primary) hypertension: Secondary | ICD-10-CM

## 2012-12-31 DIAGNOSIS — Z0000 Encounter for general adult medical examination without abnormal findings: Secondary | ICD-10-CM | POA: Insufficient documentation

## 2012-12-31 MED ORDER — AMLODIPINE BESYLATE 10 MG PO TABS: ORAL_TABLET | ORAL | Status: DC

## 2012-12-31 MED ORDER — METOPROLOL TARTRATE 100 MG PO TABS: 50.0000 mg | ORAL_TABLET | Freq: Two times a day (BID) | ORAL | Status: DC

## 2012-12-31 NOTE — Patient Instructions (Addendum)
Flu shot today Pneumonia vaccine today We will schedule bone density test at check out  Take an extra 1000 iu of vitamin D3 over the counter daily Keep taking good care of yourself

## 2012-12-31 NOTE — Telephone Encounter (Signed)
Pt left v/m; pt joined medicare in July and pt wants to know if her appt today is for a welcome to medicare wellness visit. Spoke with Susan Woods and pt is scheduled today for medicare wellness visit. l left v/m for pt with this info.

## 2012-12-31 NOTE — Progress Notes (Signed)
Subjective:    Patient ID: Susan Woods, female    DOB: Mar 10, 1946, 67 y.o.   MRN: [id number]  HPI I have personally reviewed the Medicare Annual Wellness questionnaire and have noted 1. The patient's medical and social history 2. Their use of alcohol, tobacco or illicit drugs 3. Their current medications and supplements 4. The patient's functional ability including ADL's, fall risks, home safety risks and hearing or visual             impairment. 5. Diet and physical activities 6. Evidence for depression or mood disorders  The patients weight, height, BMI have been recorded in the chart and visual acuity is per eye clinic.  I have made referrals, counseling and provided education to the patient based review of the above and I have provided the pt with a written personalized care plan for preventive services.  Has been doing well - and taking good care of herself  No complaints  Exercises well   See scanned forms.  Routine anticipatory guidance given to patient.  See health maintenance. Flu-had that today  Shingles PNA- will get vaccine today Tetanus vaccine 4/08 Colonoscopy 1/06 - 10 year recall  Breast cancer screening- had her mammogram today - glad to get that done  No breast lumps  Has had a hysterectomy -no gyn problems / in past pelvic pain came from her back  Advance directive- has a living will set  Cognitive function addressed- see scanned forms- and if abnormal then additional documentation follows. No major memory problems   PMH and SH reviewed  Meds, vitals, and allergies reviewed.   ROS: See HPI.  Otherwise negative.    Last dexa was 09 -stable OP  Will get that scheduled  She exercises D level is 39 - she takes a ca plus D supplement   bp is stable today  No cp or palpitations or headaches or edema  No side effects to medicines  BP Readings from Last 3 Encounters:  12/31/12 124/94  10/18/12 118/79  06/07/12 154/92    117/78 this am at  home     Patient Active Problem List   Diagnosis Date Noted  . Encounter for Medicare annual wellness exam 12/31/2012  . Vaginal pain 06/07/2012  . Urethritis 06/07/2012  . Routine general medical examination at a health care facility 02/20/2011  . PRURITUS 04/18/2010  . MENINGIOMA 11/17/2009  . TIA 11/17/2009  . OSTEOPENIA 03/19/2007  . HYPOGLYCEMIA 06/12/2006  . ANEMIA 06/12/2006  . HYPERTENSION 06/12/2006  . ALLERGIC RHINITIS 06/12/2006  . GERD 06/12/2006   Past Medical History  Diagnosis Date  . Nonallergic rhinitis   . GERD (gastroesophageal reflux disease)   . HTN (hypertension)     echo (8/11): EF 55%, mild MR, mild TR  . Osteoporosis   . Anemia   . Meningioma     small; advised no f/u unless symptomatic by Dr. Kemper Durie 8/11  . Tingling     in hands of ? etiol  . Nasal pruritis     full body w/o rash   . Psoriasis     mild intermittent    Past Surgical History  Procedure Laterality Date  . Cesarean section    . Vesicovaginal fistula closure w/ tah    . Oophorectomy    . Nasal sinus surgery  2002  . Abdominal surgery      adhesions  . Colonoscopy  2006  . Dexa - osteopenia  2005    dexa 2009 - osteoporosis/fairly stable   .  Orif finger / thumb fracture  12/06  . Urethral stricutre      dilated  . Hosp tia  8/11  . Small meningioma    . 2d echo  8/11    nml   . Abdominal hysterectomy     History  Substance Use Topics  . Smoking status: Former Games developer  . Smokeless tobacco: Not on file  . Alcohol Use: Yes     Comment: rarely   Family History  Problem Relation Age of Onset  . Cancer Father     throat CA  . Diabetes Father   . Heart disease Mother     MI late 14s - CHF   . Hypertension Mother   . Stroke Brother   . Diabetes Brother   . Heart disease Sister     CHF  . Diabetes Sister   . Hypertension Brother   . Diabetes Brother    Allergies  Allergen Reactions  . Amlodipine Besylate     REACTION: itching  . Indomethacin     REACTION:  swelling, rash  . Levofloxacin Other (See Comments)    Sees spots  . Lisinopril     REACTION: itching  . Olmesartan Medoxomil     REACTION: ? itching also does not work well  . Penicillins     REACTION: urticaria (hives)  . Sulfonamide Derivatives     REACTION: tingling around mouth and hands felt tight   Current Outpatient Prescriptions on File Prior to Visit  Medication Sig Dispense Refill  . amLODipine (NORVASC) 10 MG tablet TAKE 1 TABLET (10 MG TOTAL) BY MOUTH DAILY.  90 tablet  0  . Calcium Carbonate-Vit D-Min 1200-1000 MG-UNIT CHEW Chew by mouth as directed. Unsure of dosage       . Cholecalciferol (VITAMIN D3) 1000 UNITS CAPS Take 1 capsule by mouth daily.        . Ferrous Sulfate (FE-CAPS) 250 MG CPCR Take 1 capsule by mouth daily.        . fish oil-omega-3 fatty acids 1000 MG capsule Take 2 g by mouth every other day. Alternate with flax oil       . Flaxseed, Linseed, (FLAX SEED OIL PO) Take by mouth as directed. Alternate with fish oil       . fluocinonide-emollient (LIDEX-E) 0.05 % cream Apply topically 2 (two) times daily.  30 g  0  . fluticasone (FLONASE) 50 MCG/ACT nasal spray Place 2 sprays into the nose daily.  48 g  0  . levocetirizine (XYZAL) 5 MG tablet TAKE 1 TABLET (5 MG TOTAL) BY MOUTH DAILY AS NEEDED.  90 tablet  0  . metoprolol (LOPRESSOR) 100 MG tablet Take 0.5 tablets (50 mg total) by mouth 2 (two) times daily.  90 tablet  0  . Multiple Vitamin (MULTIVITAMIN) tablet Take 1 tablet by mouth daily.        . NON FORMULARY similisan dry eye drops and ear drops. UAD PRN       . Olopatadine HCl (PATADAY) 0.2 % SOLN Apply 1 drop to eye daily as needed.       . cyclobenzaprine (FLEXERIL) 10 MG tablet Take 5-10 mg by mouth 3 (three) times daily as needed.        . fluocinolone (DERMA-SMOOTHE/FS BODY) 0.01 % external oil Apply topically daily. Can leave on overnight if needed        No current facility-administered medications on file prior to visit.     Review of  Systems Review  of Systems  Constitutional: Negative for fever, appetite change, fatigue and unexpected weight change.  Eyes: Negative for pain and visual disturbance.  Respiratory: Negative for cough and shortness of breath.   Cardiovascular: Negative for cp or palpitations    Gastrointestinal: Negative for nausea, diarrhea and constipation.  Genitourinary: Negative for urgency and frequency.  Skin: Negative for pallor or rash   Neurological: Negative for weakness, light-headedness, numbness and headaches.  Hematological: Negative for adenopathy. Does not bruise/bleed easily.  Psychiatric/Behavioral: Negative for dysphoric mood. The patient is not nervous/anxious.         Objective:   Physical Exam  Constitutional: She appears well-developed and well-nourished. No distress.  HENT:  Head: Normocephalic and atraumatic.  Right Ear: External ear normal.  Left Ear: External ear normal.  Mouth/Throat: Oropharynx is clear and moist.  Eyes: Conjunctivae and EOM are normal. Pupils are equal, round, and reactive to light. No scleral icterus.  Neck: Normal range of motion. Neck supple. No JVD present. Carotid bruit is not present. No thyromegaly present.  Cardiovascular: Normal rate, regular rhythm, normal heart sounds and intact distal pulses.  Exam reveals no gallop.   Pulmonary/Chest: Effort normal and breath sounds normal. No respiratory distress. She has no wheezes. She exhibits no tenderness.  Abdominal: Soft. Bowel sounds are normal. She exhibits no distension, no abdominal bruit and no mass. There is no tenderness.  Genitourinary: No breast swelling, tenderness, discharge or bleeding.  Breast exam: No mass, nodules, thickening, tenderness, bulging, retraction, inflamation, nipple discharge or skin changes noted.  No axillary or clavicular LA.  Chaperoned exam.    Musculoskeletal: Normal range of motion. She exhibits no edema and no tenderness.  Lymphadenopathy:    She has no cervical  adenopathy.  Neurological: She is alert. She has normal reflexes. No cranial nerve deficit. She exhibits normal muscle tone. Coordination normal.  Skin: Skin is warm and dry. No rash noted. No erythema. No pallor.  Psychiatric: She has a normal mood and affect.          Assessment & Plan:

## 2013-01-01 ENCOUNTER — Encounter: Payer: Self-pay | Admitting: Family Medicine

## 2013-01-01 NOTE — Assessment & Plan Note (Signed)
Reviewed health habits including diet and exercise and skin cancer prevention Also reviewed health mt list, fam hx and immunizations  See HPI Flu vac and pneumovax today  Wellness labs rev

## 2013-01-01 NOTE — Assessment & Plan Note (Signed)
Ref for dexa  Disc ca and vit D Will continue exercise No falls or fx

## 2013-01-01 NOTE — Assessment & Plan Note (Signed)
bp in fair control at this time  No changes needed  Disc lifstyle change with low sodium diet and exercise   

## 2013-01-02 ENCOUNTER — Encounter: Payer: Self-pay | Admitting: *Deleted

## 2013-01-13 ENCOUNTER — Other Ambulatory Visit: Payer: Self-pay | Admitting: Family Medicine

## 2013-01-24 LAB — HM DEXA SCAN

## 2013-01-30 ENCOUNTER — Encounter: Payer: Self-pay | Admitting: Family Medicine

## 2013-01-30 ENCOUNTER — Ambulatory Visit (INDEPENDENT_AMBULATORY_CARE_PROVIDER_SITE_OTHER): Payer: Medicare Other | Admitting: Family Medicine

## 2013-01-30 VITALS — BP 130/84 | HR 67 | Temp 98.1°F | Ht 64.0 in | Wt 145.5 lb

## 2013-01-30 DIAGNOSIS — M79609 Pain in unspecified limb: Secondary | ICD-10-CM

## 2013-01-30 DIAGNOSIS — M79671 Pain in right foot: Secondary | ICD-10-CM | POA: Insufficient documentation

## 2013-01-30 MED ORDER — MELOXICAM 15 MG PO TABS: 15.0000 mg | ORAL_TABLET | Freq: Every day | ORAL | Status: DC

## 2013-01-30 NOTE — Patient Instructions (Signed)
Elevation of feet, gentle range of motion stretching. Can use meloxicam 15 mg daily for antiinflammatory. Wear good arch support. Follow up in 2 weeks with Dr. Patsy Lager sport medicine.

## 2013-01-30 NOTE — Progress Notes (Signed)
Pre-visit discussion using our clinic review tool. No additional management support is needed unless otherwise documented below in the visit note.  

## 2013-01-30 NOTE — Assessment & Plan Note (Signed)
No focal area of tenderness. No indication for X-ray/.   Wear arch support, can elevate feet and start NSAIDS. Follow up with sports med if not improving.

## 2013-01-30 NOTE — Progress Notes (Signed)
  Subjective:    Patient ID: Susan Woods, female    DOB: April 10, 1945, 67 y.o.   MRN: [id number]  HPI  67 year old female pt of Dr. Royden Purl presents with 3 weeks of new onset pain in B feet. Pain began in  B arches of feet, then spread up to B ankles. This is now burning in posterior ankle and up to calf... None on bottom of feet. Pain 3-4/10 Feels better when she is on feet, but worsens when she rests.   No known injury and fall. No swelling , no redness.  Has been elevating feet.  Tried tylenol, hot baths with epsom salt.   She walks a lot, does ellipitical.    She has hx of fallen arches.   Review of Systems  Constitutional: Negative for fever and fatigue.  HENT: Negative for ear pain.   Eyes: Negative for pain.  Respiratory: Negative for chest tightness and shortness of breath.   Cardiovascular: Negative for chest pain, palpitations and leg swelling.  Gastrointestinal: Negative for abdominal pain.  Genitourinary: Negative for dysuria.       Objective:   Physical Exam  Constitutional: Vital signs are normal. She appears well-developed and well-nourished. She is cooperative.  Non-toxic appearance. She does not appear ill. No distress.  HENT:  Right Ear: Hearing, tympanic membrane and ear canal normal. Tympanic membrane is not erythematous, not retracted and not bulging.  Left Ear: Hearing, tympanic membrane and ear canal normal. Tympanic membrane is not erythematous, not retracted and not bulging.  Nose: No mucosal edema or rhinorrhea. Right sinus exhibits no maxillary sinus tenderness and no frontal sinus tenderness. Left sinus exhibits no maxillary sinus tenderness and no frontal sinus tenderness.  Mouth/Throat: Uvula is midline and mucous membranes are normal.  Eyes: Lids are normal. Lids are everted and swept, no foreign bodies found.  Neck: Trachea normal. Carotid bruit is not present. No mass and no thyromegaly present.  Cardiovascular: Normal rate,  regular rhythm, S1 normal, S2 normal, normal heart sounds, intact distal pulses and normal pulses.  Exam reveals no gallop and no friction rub.   No murmur heard. Pulmonary/Chest: Effort normal and breath sounds normal. Not tachypneic. No respiratory distress. She has no decreased breath sounds. She has no wheezes. She has no rhonchi. She has no rales.  Abdominal: Soft. Normal appearance and bowel sounds are normal. There is no tenderness.  Musculoskeletal:       Right foot: She exhibits tenderness. She exhibits normal range of motion, no bony tenderness and no swelling.       Left foot: She exhibits tenderness. She exhibits normal range of motion, no bony tenderness and no swelling.  No ttp over achilles tendon, no lateral or medical malleolus pain, no ttp ove plantar fascia insertion... ttp diffusely over dorsal foot and points to burning area of pain over calf and achilles  Neurological: She is alert. She has normal strength. No sensory deficit.  Skin: Skin is warm, dry and intact. No rash noted.  Psychiatric: Her speech is normal and behavior is normal. Judgment and thought content normal. Her mood appears not anxious. Cognition and memory are normal. She does not exhibit a depressed mood.          Assessment & Plan:

## 2013-02-07 ENCOUNTER — Encounter: Payer: Self-pay | Admitting: *Deleted

## 2013-02-07 ENCOUNTER — Encounter: Payer: Self-pay | Admitting: Family Medicine

## 2013-03-03 ENCOUNTER — Encounter: Payer: Self-pay | Admitting: Family Medicine

## 2013-03-03 ENCOUNTER — Ambulatory Visit (INDEPENDENT_AMBULATORY_CARE_PROVIDER_SITE_OTHER): Payer: Medicare Other | Admitting: Family Medicine

## 2013-03-03 VITALS — BP 138/88 | HR 73 | Temp 97.9°F | Ht 64.0 in | Wt 146.5 lb

## 2013-03-03 DIAGNOSIS — M810 Age-related osteoporosis without current pathological fracture: Secondary | ICD-10-CM

## 2013-03-03 DIAGNOSIS — M79671 Pain in right foot: Secondary | ICD-10-CM

## 2013-03-03 DIAGNOSIS — M79609 Pain in unspecified limb: Secondary | ICD-10-CM

## 2013-03-03 MED ORDER — ALENDRONATE SODIUM 70 MG PO TABS: 70.0000 mg | ORAL_TABLET | ORAL | Status: DC

## 2013-03-03 NOTE — Progress Notes (Signed)
Subjective:    Patient ID: Susan Woods, female    DOB: 02-25-1946, 67 y.o.   MRN: [id number]  HPI Here for f/u of OP   dexa done recently at [facility] - lowest T score is -2.6 at femoral neck  Openia spine- that inc a bit 2.9%- thought to be due to deg scoliosis She exercises regularly  Fracture in the past- L wrist and L small toe  No spinal fracutres  She takes her ca and D D level is therapeutic -is very good about that   She is open to medicine    Is petite Former smoker- quit the 14s Sister has osteopenia  Hysterectomy in 30s - total (early menopause)    She is having problems with ankles "burning"- with arches falling also  She saw Dr Ermalene Searing- gave her meloxicam - short term  She got better - for a while and she is using arch supports in shoe   Symptoms are actually better when she is walking -worse when still  No swelling   Took ibuprofen a few days over the weekend    Patient Active Problem List   Diagnosis Date Noted  . Bilateral foot pain 01/30/2013  . Encounter for Medicare annual wellness exam 12/31/2012  . Vaginal pain 06/07/2012  . Urethritis 06/07/2012  . Routine general medical examination at a health care facility 02/20/2011  . PRURITUS 04/18/2010  . MENINGIOMA 11/17/2009  . TIA 11/17/2009  . Osteoporosis 03/19/2007  . HYPOGLYCEMIA 06/12/2006  . ANEMIA 06/12/2006  . HYPERTENSION 06/12/2006  . ALLERGIC RHINITIS 06/12/2006  . GERD 06/12/2006   Past Medical History  Diagnosis Date  . Nonallergic rhinitis   . GERD (gastroesophageal reflux disease)   . HTN (hypertension)     echo (8/11): EF 55%, mild MR, mild TR  . Osteoporosis   . Anemia   . Meningioma     small; advised no f/u unless symptomatic by Dr. Kemper Durie 8/11  . Tingling     in hands of ? etiol  . Nasal pruritis     full body w/o rash   . Psoriasis     mild intermittent    Past Surgical History  Procedure Laterality Date  . Cesarean section    . Vesicovaginal  fistula closure w/ tah    . Oophorectomy    . Nasal sinus surgery  2002  . Abdominal surgery      adhesions  . Colonoscopy  2006  . Dexa - osteopenia  2005    dexa 2009 - osteoporosis/fairly stable   . Orif finger / thumb fracture  12/06  . Urethral stricutre      dilated  . Hosp tia  8/11  . Small meningioma    . 2d echo  8/11    nml   . Abdominal hysterectomy     History  Substance Use Topics  . Smoking status: Former Games developer  . Smokeless tobacco: Never Used  . Alcohol Use: Yes     Comment: rarely   Family History  Problem Relation Age of Onset  . Cancer Father     throat CA  . Diabetes Father   . Heart disease Mother     MI late 80s - CHF   . Hypertension Mother   . Stroke Brother   . Diabetes Brother   . Heart disease Sister     CHF  . Diabetes Sister   . Hypertension Brother   . Diabetes Brother    Allergies  Allergen Reactions  . Amlodipine Besylate     REACTION: itching  . Indomethacin     REACTION: swelling, rash  . Levofloxacin Other (See Comments)    Sees spots  . Lisinopril     REACTION: itching  . Olmesartan Medoxomil     REACTION: ? itching also does not work well  . Penicillins     REACTION: urticaria (hives)  . Sulfonamide Derivatives     REACTION: tingling around mouth and hands felt tight   Current Outpatient Prescriptions on File Prior to Visit  Medication Sig Dispense Refill  . amLODipine (NORVASC) 10 MG tablet TAKE 1 TABLET (10 MG TOTAL) BY MOUTH DAILY.  90 tablet  3  . Calcium Carb-Cholecalciferol (CALCIUM 600 + D PO) Take 1 tablet by mouth 2 (two) times daily.      . Cholecalciferol (VITAMIN D3) 1000 UNITS CAPS Take 1 capsule by mouth daily.        . cyclobenzaprine (FLEXERIL) 10 MG tablet Take 5-10 mg by mouth 3 (three) times daily as needed.        . Ferrous Sulfate (FE-CAPS) 250 MG CPCR Take 1 capsule by mouth daily.        . fish oil-omega-3 fatty acids 1000 MG capsule Take 2 g by mouth every other day. Alternate with flax oil        . Flaxseed, Linseed, (FLAX SEED OIL PO) Take by mouth as directed. Alternate with fish oil       . fluocinolone (DERMA-SMOOTHE/FS BODY) 0.01 % external oil Apply topically daily. Can leave on overnight if needed       . fluocinonide-emollient (LIDEX-E) 0.05 % cream Apply topically 2 (two) times daily.  30 g  0  . fluticasone (FLONASE) 50 MCG/ACT nasal spray Place 2 sprays into the nose daily as needed.      Marland Kitchen levocetirizine (XYZAL) 5 MG tablet TAKE 1 TABLET (5 MG TOTAL) BY MOUTH DAILY AS NEEDED.  90 tablet  0  . metoprolol (LOPRESSOR) 100 MG tablet Take 0.5 tablets (50 mg total) by mouth 2 (two) times daily.  90 tablet  3  . Misc Natural Products (ECHINACEA COMPOUND) LIQD 10 drops daily in the morning during fall/winter      . Multiple Vitamin (MULTIVITAMIN) tablet Take 1 tablet by mouth daily.        . NON FORMULARY similisan dry eye drops and ear drops. UAD PRN       . meloxicam (MOBIC) 15 MG tablet Take 1 tablet (15 mg total) by mouth daily.  30 tablet  0   No current facility-administered medications on file prior to visit.    Review of Systems Review of Systems  Constitutional: Negative for fever, appetite change, fatigue and unexpected weight change.  Eyes: Negative for pain and visual disturbance.  Respiratory: Negative for cough and shortness of breath.   Cardiovascular: Negative for cp or palpitations    Gastrointestinal: Negative for nausea, diarrhea and constipation.  Genitourinary: Negative for urgency and frequency.  Skin: Negative for pallor or rash   MSK pos for foot and ankle pain  Neurological: Negative for weakness, light-headedness, numbness and headaches.  Hematological: Negative for adenopathy. Does not bruise/bleed easily.  Psychiatric/Behavioral: Negative for dysphoric mood. The patient is not nervous/anxious.         Objective:   Physical Exam  Constitutional: She appears well-developed and well-nourished. No distress.  Petite frame  HENT:  Head:  Normocephalic and atraumatic.  Eyes: Conjunctivae and EOM are normal.  Pupils are equal, round, and reactive to light.  Neck: Normal range of motion. Neck supple. No thyromegaly present.  Pulmonary/Chest: Effort normal and breath sounds normal.  Musculoskeletal: Normal range of motion. She exhibits no edema and no tenderness.  No kyphosis  No ankle or foot tenderness or swelling today   Neurological: She is alert. She has normal reflexes. No cranial nerve deficit. She exhibits normal muscle tone. Coordination normal.  Skin: Skin is dry. No rash noted. No erythema. No pallor.  Psychiatric: She has a normal mood and affect.          Assessment & Plan:

## 2013-03-03 NOTE — Progress Notes (Signed)
Pre-visit discussion using our clinic review tool. No additional management support is needed unless otherwise documented below in the visit note.  

## 2013-03-03 NOTE — Assessment & Plan Note (Signed)
Pt c/o of foot pain and arch problems -even with otc shoe inserts Will ref to Dr Patsy Lager for further eval in this athletic female

## 2013-03-03 NOTE — Patient Instructions (Signed)
Start once weekly generic fosamax -take it as directed- to prevent bone fractures Keep working on healthy diet/ exercise and continue calcium and vitamin D If you have any side effects- like reflux/ stomach pain or trouble swallowing- stop the medicine and let me know   Make appt with Dr Patsy Lager at check out for your foot and ankle problem

## 2013-03-03 NOTE — Assessment & Plan Note (Signed)
Rev dexa  Will try weekly fosamax- disc way to take it and poss side eff -will update if any GI symptoms  Disc need for calcium/ vitamin D/ wt bearing exercise and bone density test every 2 y to monitor Disc safety/ fracture risk in detail

## 2013-03-07 ENCOUNTER — Encounter: Payer: Self-pay | Admitting: Family Medicine

## 2013-03-07 ENCOUNTER — Ambulatory Visit (INDEPENDENT_AMBULATORY_CARE_PROVIDER_SITE_OTHER)
Admission: RE | Admit: 2013-03-07 | Discharge: 2013-03-07 | Disposition: A | Discharge status: Home / Self Care | Payer: Medicare Other | Source: Ambulatory Visit | Attending: Family Medicine | Admitting: Family Medicine

## 2013-03-07 ENCOUNTER — Ambulatory Visit (INDEPENDENT_AMBULATORY_CARE_PROVIDER_SITE_OTHER): Payer: Medicare Other | Admitting: Family Medicine

## 2013-03-07 VITALS — BP 128/80 | HR 65 | Temp 98.0°F | Ht 64.0 in | Wt 148.8 lb

## 2013-03-07 DIAGNOSIS — M79609 Pain in unspecified limb: Secondary | ICD-10-CM

## 2013-03-07 DIAGNOSIS — M25571 Pain in right ankle and joints of right foot: Secondary | ICD-10-CM

## 2013-03-07 DIAGNOSIS — M25579 Pain in unspecified ankle and joints of unspecified foot: Secondary | ICD-10-CM

## 2013-03-07 DIAGNOSIS — M8430XA Stress fracture, unspecified site, initial encounter for fracture: Secondary | ICD-10-CM

## 2013-03-07 DIAGNOSIS — M79672 Pain in left foot: Secondary | ICD-10-CM

## 2013-03-07 NOTE — Progress Notes (Signed)
Date:  03/07/2013   Name:  Susan Woods   DOB:  20-Jan-1946   MRN:  [id number] Gender: female Age: 67 y.o.  Primary Physician:  Roxy Manns, MD   Chief Complaint: Foot Pain and Ankle Pain   Subjective:   History of Present Illness:  Susan Woods is a 67 y.o. pleasant patient who presents with the following:  Dr. Milinda Antis wanted me to evaluate the patient for foot pain:  He is pleasant and active lady with a completely normal BMI at 26, who has historically been quite active throughout her lifetime who presents with a multiple month history of pain in the dorsum of the forefoot bilaterally, and more recently she has started to develop some pain in and around her distal tibias. Her pain in this region is at the medial aspect of the tibia distally.  Her history is significant for significant osteoporosis.  Orthopedic history is significant for an operative thumb fracture in 2006. She also has a history of a transient ischemic attack in 2011.  A couple of months and around the whole foot and feeling a burn in the foot and along shin. Does the elliptical and shins. Some in the wrists.  Feels good with swimming.    Patient Active Problem List   Diagnosis Date Noted  . Bilateral foot pain 01/30/2013  . Encounter for Medicare annual wellness exam 12/31/2012  . Vaginal pain 06/07/2012  . Urethritis 06/07/2012  . Routine general medical examination at a health care facility 02/20/2011  . PRURITUS 04/18/2010  . MENINGIOMA 11/17/2009  . TIA 11/17/2009  . Osteoporosis 03/19/2007  . HYPOGLYCEMIA 06/12/2006  . ANEMIA 06/12/2006  . HYPERTENSION 06/12/2006  . ALLERGIC RHINITIS 06/12/2006  . GERD 06/12/2006    Past Medical History  Diagnosis Date  . Nonallergic rhinitis   . GERD (gastroesophageal reflux disease)   . HTN (hypertension)     echo (8/11): EF 55%, mild MR, mild TR  . Osteoporosis   . Anemia   . Meningioma     small; advised no f/u unless  symptomatic by Dr. Kemper Durie 8/11  . Tingling     in hands of ? etiol  . Nasal pruritis     full body w/o rash   . Psoriasis     mild intermittent     Past Surgical History  Procedure Laterality Date  . Cesarean section    . Vesicovaginal fistula closure w/ tah    . Oophorectomy    . Nasal sinus surgery  2002  . Abdominal surgery      adhesions  . Colonoscopy  2006  . Dexa - osteopenia  2005    dexa 2009 - osteoporosis/fairly stable   . Orif finger / thumb fracture  12/06  . Urethral stricutre      dilated  . Hosp tia  8/11  . Small meningioma    . 2d echo  8/11    nml   . Abdominal hysterectomy      History   Social History  . Marital Status: Single    Spouse Name: N/A    Number of Children: N/A  . Years of Education: N/A   Occupational History  . Not on file.   Social History Main Topics  . Smoking status: Former Games developer  . Smokeless tobacco: Never Used  . Alcohol Use: Yes     Comment: rarely  . Drug Use: No  . Sexual Activity: Not on file   Other Topics Concern  .  Not on file   Social History Narrative   Lives in [place]; retired (worked for [employer]); Agricultural consultant for [employer].       Cell [phone]    Family History  Problem Relation Age of Onset  . Cancer Father     throat CA  . Diabetes Father   . Heart disease Mother     MI late 45s - CHF   . Hypertension Mother   . Stroke Brother   . Diabetes Brother   . Heart disease Sister     CHF  . Diabetes Sister   . Hypertension Brother   . Diabetes Brother     Allergies  Allergen Reactions  . Amlodipine Besylate     REACTION: itching  . Indomethacin     REACTION: swelling, rash  . Levofloxacin Other (See Comments)    Sees spots  . Lisinopril     REACTION: itching  . Olmesartan Medoxomil     REACTION: ? itching also does not work well  . Penicillins     REACTION: urticaria (hives)  . Sulfonamide Derivatives     REACTION: tingling around mouth and hands felt tight     Medication list has been reviewed and updated.  Review of Systems:  GEN: No fevers, chills. Nontoxic. Primarily MSK c/o today. MSK: Detailed in the HPI GI: tolerating PO intake without difficulty Neuro: No numbness, parasthesias, or tingling associated. Otherwise the pertinent positives of the ROS are noted above.   Objective:   Physical Examination: BP 128/80  Pulse 65  Temp(Src) 98 F (36.7 C) (Oral)  Ht 5\' 4"  (1.626 m)  Wt 148 lb 12 oz (67.473 kg)  BMI 25.52 kg/m2  SpO2 99%  Ideal Body Weight: Weight in (lb) to have BMI = 25: 145.3   GEN: WDWN, NAD, Non-toxic, Alert & Oriented x 3 HEENT: Atraumatic, Normocephalic.  Ears and Nose: No external deformity. EXTR: No clubbing/cyanosis/edema NEURO: mildly antalgic gait.  PSYCH: Normally interactive. Conversant. Not depressed or anxious appearing.  Calm demeanor.    Along the tibias there is tenderness bilaterally distally on the medial aspect of the tibia. There is some minimal amount of swelling in each side. Using a tuning fork there is no tenderness proximally, but in the area in question there is some tenderness when applying a tuning fork. There is also tenderness to percussion.  Bilateral ankle exam: Nontender at the malleoli. Anterior talofibular and calcaneofibular ligaments are stable. Deltoid ligaments are stable. Anterior drawer and subtalar tilt testing is negative.  Entire midfoot is nontender.  In the forefoot primarily around metatarsals 23 and 4 bilaterally there is tenderness to palpation.  DP and PT pulses are 2+ bilaterally.  Dg Foot Complete Left  03/07/2013   CLINICAL DATA:  Left foot pain  EXAM: LEFT FOOT - COMPLETE 3+ VIEW  COMPARISON:  None.  FINDINGS: Three views of the left foot submitted. No acute fracture or subluxation. No radiopaque foreign body.  IMPRESSION: Negative.   Electronically Signed   By: Natasha Mead M.D.   On: 03/07/2013 10:21   Dg Foot Complete Right  03/07/2013   CLINICAL  DATA:  67 year old female with foot pain. Initial encounter. Suspicion of stress fracture.  EXAM: RIGHT FOOT COMPLETE - 3+ VIEW  COMPARISON:  05/20/2007.  FINDINGS: Calcaneus appears stable and intact. Bone mineralization is within normal limits for age. Small accessory ossicles adjacent to the cuboid are stable. Tarsal bones appear intact. Metatarsal bones appear intact and within normal limits. Mild narrowing  of the 1st MTP joint space is stable along with mild subchondral sclerosis. Phalanges appear intact.  IMPRESSION: No fracture, dislocation, or acute osseous abnormality identified in the right foot.   Electronically Signed   By: Augusto Gamble M.D.   On: 03/07/2013 10:13   The radiological images were independently reviewed by myself in the office and results were reviewed with the patient. My independent interpretation of images:  On the RIGHT and LEFT foot, there is no evidence for occult fracture. No evidence for dislocation. Grossly agree with the above. Hannah Beat, MD 03/07/2013 10:50 AM    Assessment & Plan:    Stress reaction of bone, initial encounter  Pain in joint, ankle and foot, right - Plan: DG Foot Complete Right  Foot pain, left - Plan: DG Foot Complete Left  This is a concerning exam in a patient who is osteoporotic with what clinically is concerning for bilateral at least stress reaction at the distal tibia as well as potential stress reaction versus undetected stress fracture in some of the metatarsals of each foot.  I have her do no impact whatsoever for one month and reevaluate. I did tell her that she could swim for cardiovascular exercise, and she could do some upper body exercises as long as she was sitting.  If she is still symptomatic in one month, 3-phase bone scan would probably be my preferable imaging choice.  F/u 23mo  Orders Placed This Encounter  Procedures  . DG Foot Complete Left  . DG Foot Complete Right    New medications, updates to list, dose  adjustments: No orders of the defined types were placed in this encounter.    Signed,  Elpidio Galea. Tanner Vigna, MD, CAQ Sports Medicine  [facility] at [facility] [address] [place] [address] Phone: [phone] Fax: [phone]    Medication List       This list is accurate as of: 03/07/13 11:59 PM.  Always use your most recent med list.               alendronate 70 MG tablet  Commonly known as:  FOSAMAX  Take 1 tablet (70 mg total) by mouth every 7 (seven) days. Take with a full glass of water on an empty stomach.     amLODipine 10 MG tablet  Commonly known as:  NORVASC  TAKE 1 TABLET (10 MG TOTAL) BY MOUTH DAILY.     CALCIUM 600 + D PO  Take 1 tablet by mouth 2 (two) times daily.     cyclobenzaprine 10 MG tablet  Commonly known as:  FLEXERIL  Take 5-10 mg by mouth 3 (three) times daily as needed.     DERMA-SMOOTHE/FS BODY 0.01 % external oil  Generic drug:  fluocinolone  Apply topically daily. Can leave on overnight if needed     ECHINACEA COMPOUND Liqd  10 drops daily in the morning during fall/winter     FE-CAPS 250 MG Cpcr  Generic drug:  Ferrous Sulfate  Take 1 capsule by mouth daily.     fish oil-omega-3 fatty acids 1000 MG capsule  Take 2 g by mouth every other day. Alternate with flax oil     FLAX SEED OIL PO  Take by mouth as directed. Alternate with fish oil     fluocinonide-emollient 0.05 % cream  Commonly known as:  LIDEX-E  Apply topically 2 (two) times daily.     fluticasone 50 MCG/ACT nasal spray  Commonly known as:  FLONASE  Place 2 sprays into the nose daily as needed.     levocetirizine 5 MG tablet  Commonly known as:  XYZAL  TAKE 1 TABLET (5 MG TOTAL) BY MOUTH DAILY AS NEEDED.     meloxicam 15 MG tablet  Commonly known as:  MOBIC  Take 1 tablet (15 mg total) by mouth daily.     metoprolol 100 MG tablet  Commonly known as:  LOPRESSOR  Take 0.5 tablets (50 mg total) by mouth 2 (two) times daily.      multivitamin tablet  Take 1 tablet by mouth daily.     NON FORMULARY  similisan dry eye drops and ear drops. UAD PRN     Vitamin D3 1000 UNITS Caps  Take 1 capsule by mouth daily.

## 2013-03-07 NOTE — Progress Notes (Signed)
Pre-visit discussion using our clinic review tool. No additional management support is needed unless otherwise documented below in the visit note.  

## 2013-03-14 ENCOUNTER — Other Ambulatory Visit: Payer: Self-pay | Admitting: *Deleted

## 2013-03-14 MED ORDER — ALENDRONATE SODIUM 70 MG PO TABS: 70.0000 mg | ORAL_TABLET | ORAL | Status: DC

## 2013-03-14 NOTE — Telephone Encounter (Signed)
Need Rx sent to Mail order pharmacy, done

## 2013-04-07 ENCOUNTER — Ambulatory Visit (INDEPENDENT_AMBULATORY_CARE_PROVIDER_SITE_OTHER): Payer: Medicare Other | Admitting: Family Medicine

## 2013-04-07 ENCOUNTER — Encounter: Payer: Self-pay | Admitting: Family Medicine

## 2013-04-07 VITALS — BP 122/72 | HR 57 | Temp 97.9°F | Ht 64.0 in | Wt 147.5 lb

## 2013-04-07 DIAGNOSIS — M79672 Pain in left foot: Principal | ICD-10-CM

## 2013-04-07 DIAGNOSIS — M810 Age-related osteoporosis without current pathological fracture: Secondary | ICD-10-CM

## 2013-04-07 DIAGNOSIS — M79671 Pain in right foot: Secondary | ICD-10-CM

## 2013-04-07 DIAGNOSIS — M79609 Pain in unspecified limb: Secondary | ICD-10-CM

## 2013-04-07 NOTE — Progress Notes (Signed)
Date:  04/07/2013   Name:  Susan Woods   DOB:  [birth year]   MRN:  [id number] Gender: female Age: 68 y.o.  Primary Physician:  Loura Pardon, MD   Chief Complaint: Follow-up   Subjective:   History of Present Illness:  Susan Woods is a 68 y.o. pleasant patient who presents with the following:  She is here, she has been remarkably compliant, and she is doing very well. She is now virtually asymptomatic. She has been swimming and wearing some stiff boots most of the time.  03/07/2014: He is pleasant and active lady with a completely normal BMI at 26, who has historically been quite active throughout her lifetime who presents with a multiple month history of pain in the dorsum of the forefoot bilaterally, and more recently she has started to develop some pain in and around her distal tibias. Her pain in this region is at the medial aspect of the tibia distally.  Her history is significant for significant osteoporosis.  Orthopedic history is significant for an operative thumb fracture in 2006. She also has a history of a transient ischemic attack in 2011.  A couple of months and around the whole foot and feeling a burn in the foot and along shin. Does the elliptical and shins. Some in the wrists.  Feels good with swimming.    Patient Active Problem List   Diagnosis Date Noted  . Bilateral foot pain 01/30/2013  . Encounter for Medicare annual wellness exam 12/31/2012  . Vaginal pain 06/07/2012  . Urethritis 06/07/2012  . Routine general medical examination at a health care facility 02/20/2011  . PRURITUS 04/18/2010  . MENINGIOMA 11/17/2009  . TIA 11/17/2009  . Osteoporosis 03/19/2007  . HYPOGLYCEMIA 06/12/2006  . ANEMIA 06/12/2006  . HYPERTENSION 06/12/2006  . ALLERGIC RHINITIS 06/12/2006  . GERD 06/12/2006    Past Medical History  Diagnosis Date  . Nonallergic rhinitis   . GERD (gastroesophageal reflux disease)   . HTN (hypertension)    echo (8/11): EF 55%, mild MR, mild TR  . Osteoporosis   . Anemia   . Meningioma     small; advised no f/u unless symptomatic by Dr. Loletta Specter 8/11  . Tingling     in hands of ? etiol  . Nasal pruritis     full body w/o rash   . Psoriasis     mild intermittent     Past Surgical History  Procedure Laterality Date  . Cesarean section    . Vesicovaginal fistula closure w/ tah    . Oophorectomy    . Nasal sinus surgery  2002  . Abdominal surgery      adhesions  . Colonoscopy  2006  . Dexa - osteopenia  2005    dexa 2009 - osteoporosis/fairly stable   . Orif finger / thumb fracture  12/06  . Urethral stricutre      dilated  . Hosp tia  8/11  . Small meningioma    . 2d echo  8/11    nml   . Abdominal hysterectomy      History   Social History  . Marital Status: Single    Spouse Name: N/A    Number of Children: N/A  . Years of Education: N/A   Occupational History  . Not on file.   Social History Main Topics  . Smoking status: Former Smoker    Quit date: 04/08/1987  . Smokeless tobacco: Never Used  . Alcohol Use: Yes  Comment: rarely  . Drug Use: No  . Sexual Activity: Not on file   Other Topics Concern  . Not on file   Social History Narrative   Lives in [place]; retired (worked for [employer]); Psychologist, occupational for [employer].       Cell [phone]    Family History  Problem Relation Age of Onset  . Cancer Father     throat CA  . Diabetes Father   . Heart disease Mother     MI late 77s - CHF   . Hypertension Mother   . Stroke Brother   . Diabetes Brother   . Heart disease Sister     CHF  . Diabetes Sister   . Hypertension Brother   . Diabetes Brother     Allergies  Allergen Reactions  . Indomethacin     REACTION: swelling, rash  . Levofloxacin Other (See Comments)    Sees spots  . Penicillins     REACTION: urticaria (hives)  . Sulfonamide Derivatives     REACTION: tingling around mouth and hands felt tight    Medication list has been  reviewed and updated.  Review of Systems:  GEN: No fevers, chills. Nontoxic. Primarily MSK c/o today. MSK: Detailed in the HPI GI: tolerating PO intake without difficulty Neuro: No numbness, parasthesias, or tingling associated. Otherwise the pertinent positives of the ROS are noted above.   Objective:   Physical Examination: BP 122/72  Pulse 57  Temp(Src) 97.9 F (36.6 C) (Oral)  Ht 5\' 4"  (1.626 m)  Wt 147 lb 8 oz (66.906 kg)  BMI 25.31 kg/m2  SpO2 98%  Ideal Body Weight: Weight in (lb) to have BMI = 25: 145.3   GEN: WDWN, NAD, Non-toxic, Alert & Oriented x 3 HEENT: Atraumatic, Normocephalic.  Ears and Nose: No external deformity. EXTR: No clubbing/cyanosis/edema NEURO: normal gait.  PSYCH: Normally interactive. Conversant. Not depressed or anxious appearing.  Calm demeanor.    Nontender throughout the entire forefoot bilaterally. Mid foot is nontender. Ankle is nontender. Left distal tibia is mildly tender to palpation, but decreased compared to last office visit.  Dg Foot Complete Left  03/07/2013   CLINICAL DATA:  Left foot pain  EXAM: LEFT FOOT - COMPLETE 3+ VIEW  COMPARISON:  None.  FINDINGS: Three views of the left foot submitted. No acute fracture or subluxation. No radiopaque foreign body.  IMPRESSION: Negative.   Electronically Signed   By: Lahoma Crocker M.D.   On: 03/07/2013 10:21   Dg Foot Complete Right  03/07/2013   CLINICAL DATA:  69 year old female with foot pain. Initial encounter. Suspicion of stress fracture.  EXAM: RIGHT FOOT COMPLETE - 3+ VIEW  COMPARISON:  05/20/2007.  FINDINGS: Calcaneus appears stable and intact. Bone mineralization is within normal limits for age. Small accessory ossicles adjacent to the cuboid are stable. Tarsal bones appear intact. Metatarsal bones appear intact and within normal limits. Mild narrowing of the 1st MTP joint space is stable along with mild subchondral sclerosis. Phalanges appear intact.  IMPRESSION: No fracture,  dislocation, or acute osseous abnormality identified in the right foot.   Electronically Signed   By: Lars Pinks M.D.   On: 03/07/2013 10:13   The radiological images were independently reviewed by myself in the office and results were reviewed with the patient. My independent interpretation of images:  On the RIGHT and LEFT foot, there is no evidence for occult fracture. No evidence for dislocation. Grossly agree with the above. Owens Loffler, MD 04/07/2013  9:51 AM    Assessment & Plan:    Bilateral foot pain  Osteoporosis  The patient is doing very well. For the pain is gone. Still with some slight left distal tibia pain. Swimming only for the next 2-3 weeks, and progress as tolerated.  No orders of the defined types were placed in this encounter.    New medications, updates to list, dose adjustments: No orders of the defined types were placed in this encounter.    Signed,  Maud Deed. Treon Kehl, MD, [place] [facility] [place] [address] Phone: [phone] Fax: [phone]    Medication List       This list is accurate as of: 04/07/13  9:51 AM.  Always use your most recent med list.               alendronate 70 MG tablet  Commonly known as:  FOSAMAX  Take 1 tablet (70 mg total) by mouth every 7 (seven) days. Take with a full glass of water on an empty stomach.     amLODipine 10 MG tablet  Commonly known as:  NORVASC  TAKE 1 TABLET (10 MG TOTAL) BY MOUTH DAILY.     CALCIUM 600 + D PO  Take 1 tablet by mouth 2 (two) times daily.     ECHINACEA COMPOUND Liqd  10 drops daily in the morning during fall/winter     FE-CAPS 250 MG Cpcr  Generic drug:  Ferrous Sulfate  Take 1 capsule by mouth daily.     fish oil-omega-3 fatty acids 1000 MG capsule  Take 2 g by mouth every other day. Alternate with flax oil     FLAX SEED OIL PO  Take by mouth as directed. Alternate with fish oil     fluticasone 50 MCG/ACT  nasal spray  Commonly known as:  FLONASE  Place 2 sprays into the nose daily as needed.     levocetirizine 5 MG tablet  Commonly known as:  XYZAL  TAKE 1 TABLET (5 MG TOTAL) BY MOUTH DAILY AS NEEDED.     metoprolol 100 MG tablet  Commonly known as:  LOPRESSOR  Take 0.5 tablets (50 mg total) by mouth 2 (two) times daily.     multivitamin tablet  Take 1 tablet by mouth daily.     NON FORMULARY  similisan dry eye drops and ear drops. UAD PRN     Vitamin D3 1000 UNITS Caps  Take 1 capsule by mouth daily.

## 2013-04-07 NOTE — Progress Notes (Signed)
Pre-visit discussion using our clinic review tool. No additional management support is needed unless otherwise documented below in the visit note.  

## 2013-05-01 ENCOUNTER — Telehealth: Payer: Self-pay

## 2013-05-01 NOTE — Telephone Encounter (Signed)
Pt had question about billing issue for 12/31/13; pt will contact [facility] billing.

## 2013-11-06 ENCOUNTER — Telehealth: Payer: Self-pay

## 2013-11-06 MED ORDER — DERMA-SMOOTHE/FS SCALP 0.01 % EX OIL: TOPICAL_OIL | CUTANEOUS | Status: DC

## 2013-11-06 NOTE — Telephone Encounter (Signed)
Done

## 2013-11-06 NOTE — Telephone Encounter (Signed)
Pt left v/m requesting refill Derma Smooth for pts scalp to [facility]. Please advise.

## 2013-11-07 MED ORDER — MOMETASONE FUROATE 0.1 % EX OINT: TOPICAL_OINTMENT | Freq: Every day | CUTANEOUS | Status: DC

## 2013-11-07 NOTE — Telephone Encounter (Addendum)
Pt left v/m; pt appreciates refill on Derma Smooth but is too expensive; cost to pt is $ 100 +. Pt wants to know if  mometasone ointment for scalp could be sent [facility] and will be much more affordable. Pt request cb.

## 2013-11-07 NOTE — Telephone Encounter (Signed)
Left voicemail letting pt know Rx sent 

## 2013-11-07 NOTE — Addendum Note (Signed)
Addended by: Loura Pardon A on: 11/07/2013 02:11 PM   Modules accepted: Orders, Medications

## 2013-11-07 NOTE — Telephone Encounter (Signed)
Done

## 2013-12-16 ENCOUNTER — Encounter: Payer: Self-pay | Admitting: Family Medicine

## 2013-12-16 DIAGNOSIS — H35033 Hypertensive retinopathy, bilateral: Secondary | ICD-10-CM | POA: Insufficient documentation

## 2013-12-26 IMAGING — CR DG FOOT COMPLETE 3+V*L*
3 series · 3 of 3 positions shown · non-contrast
Comparison: None.

CLINICAL DATA: Left foot pain

EXAM:
LEFT FOOT - COMPLETE 3+ VIEW

[view not recorded (1 of 3)]
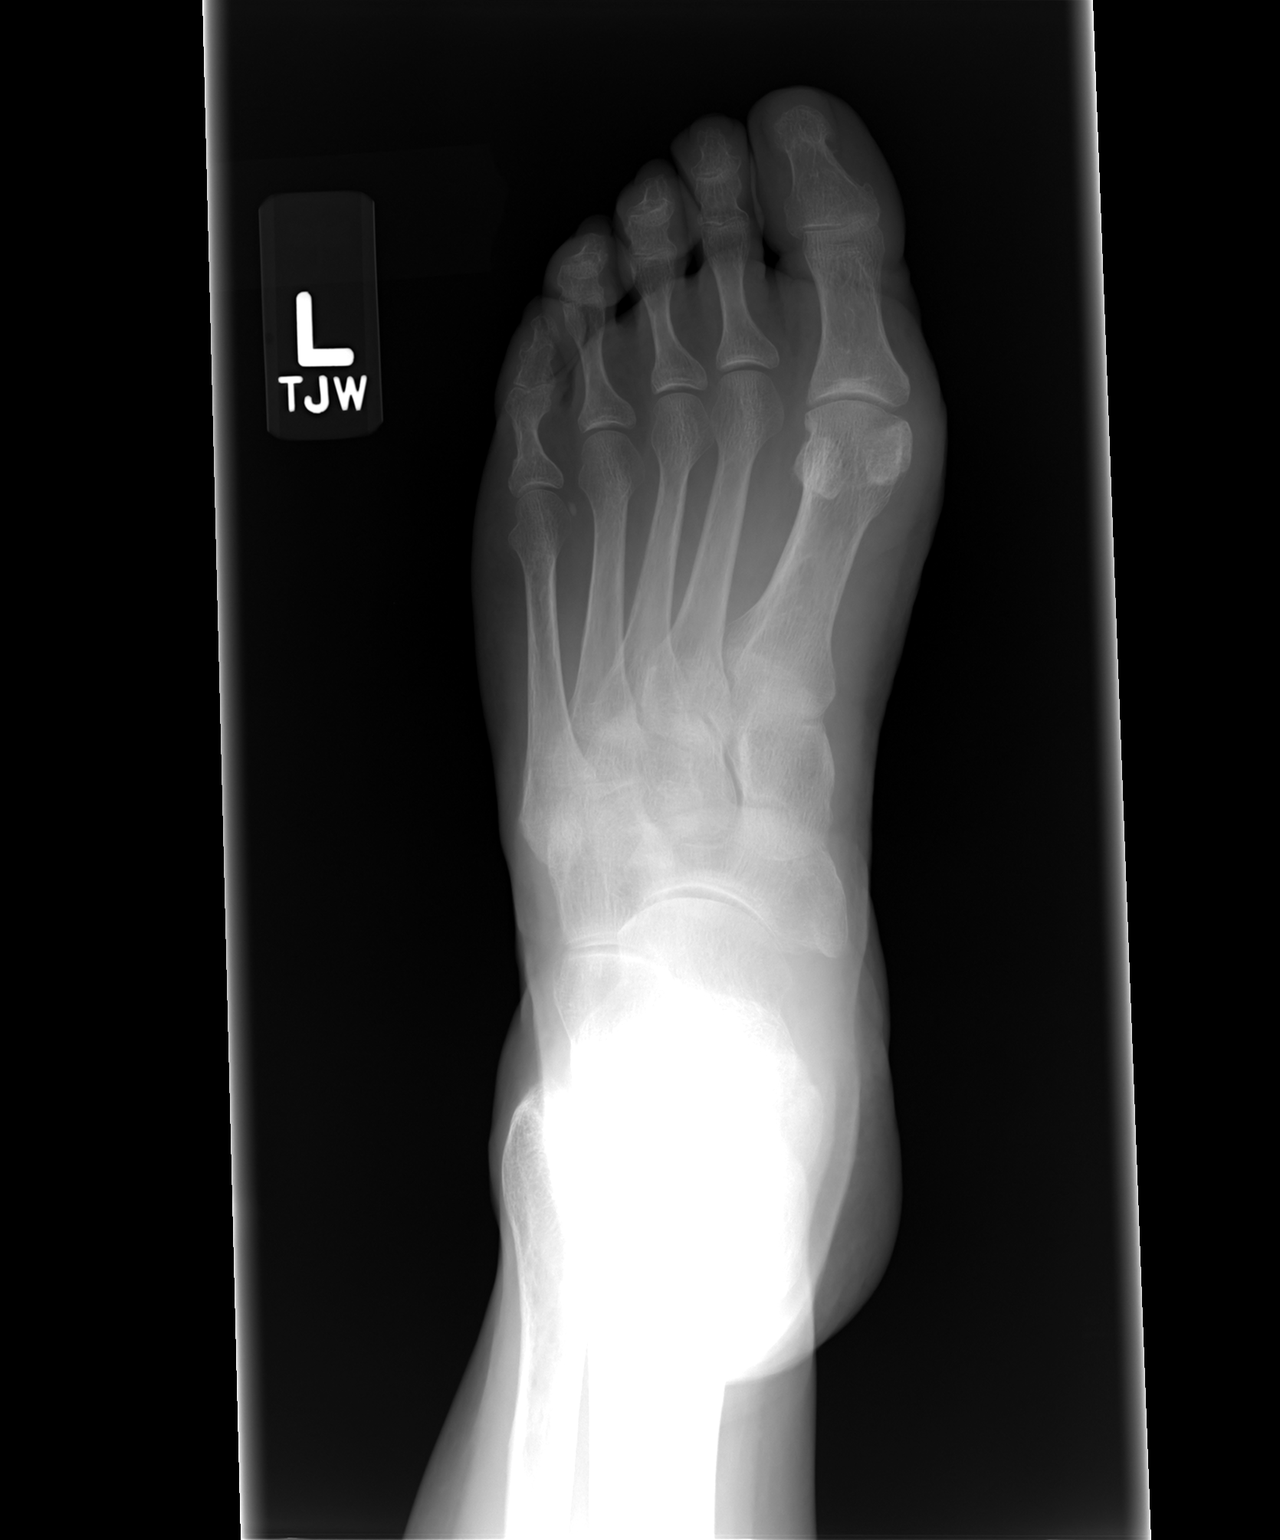

[view not recorded (2 of 3)]
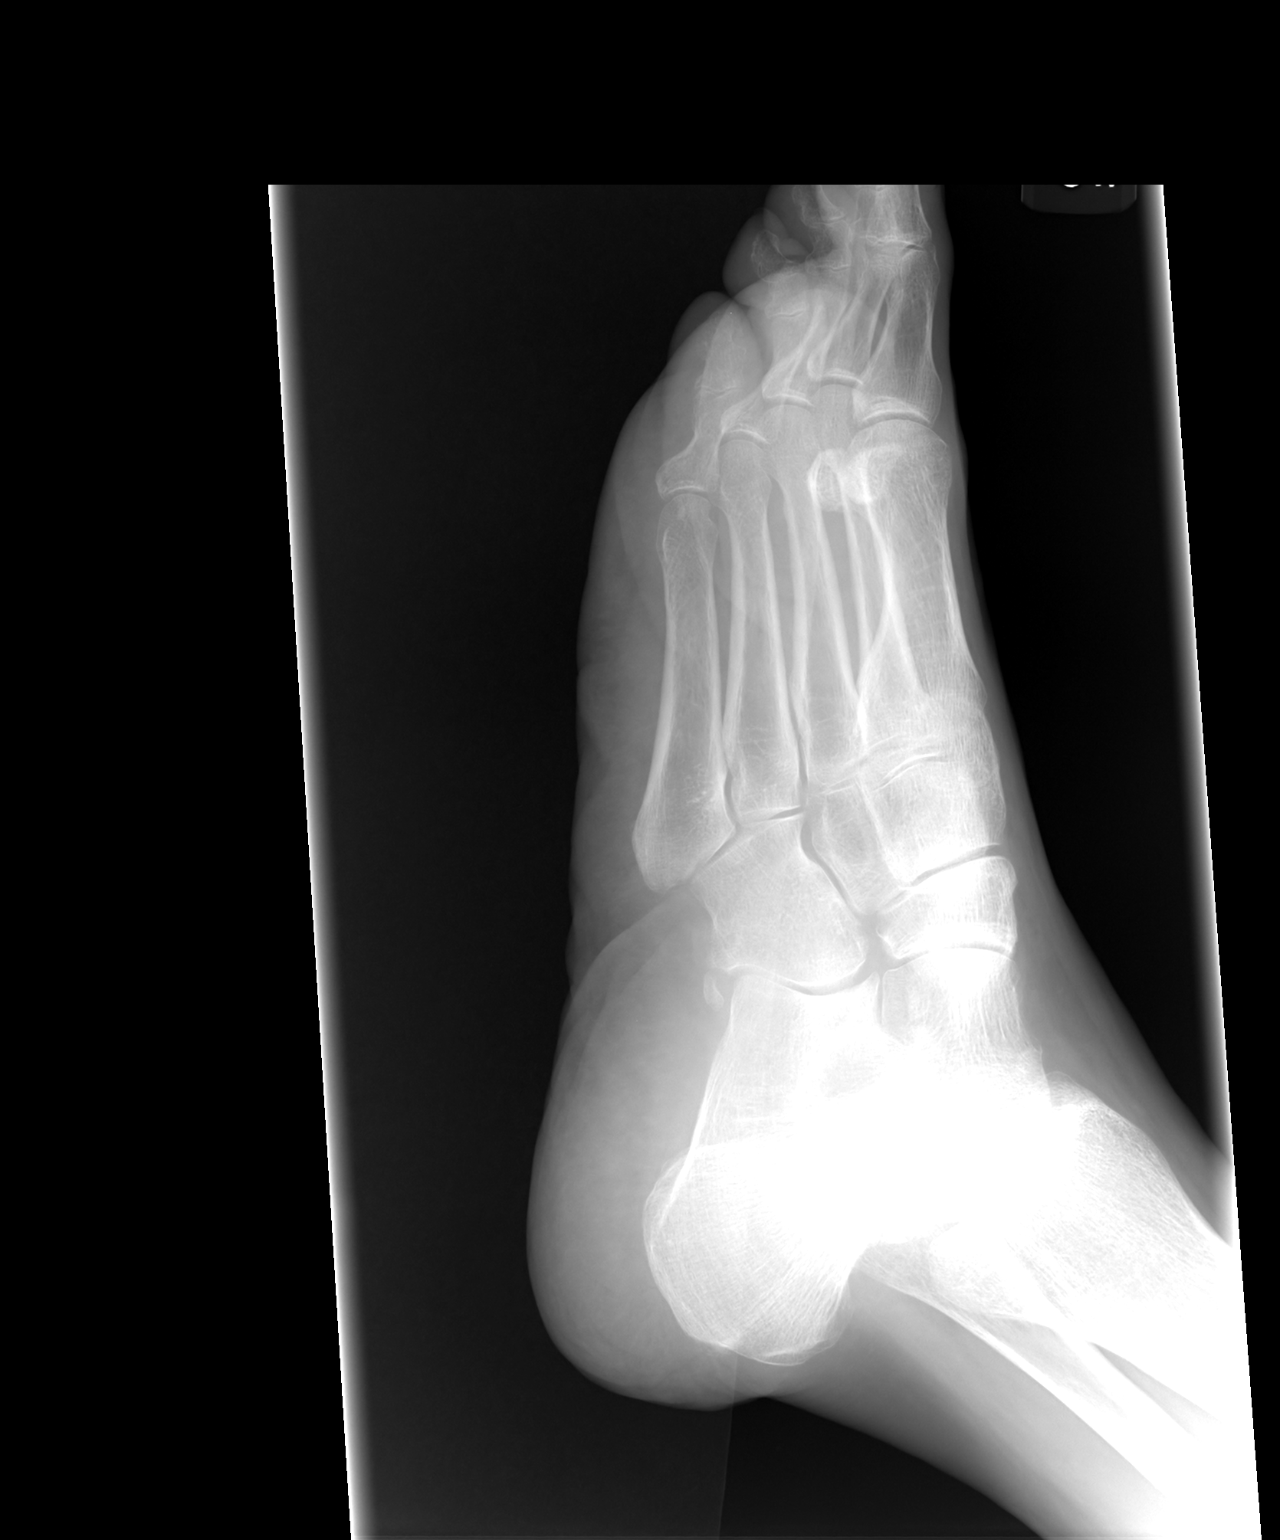

[view not recorded (3 of 3)]
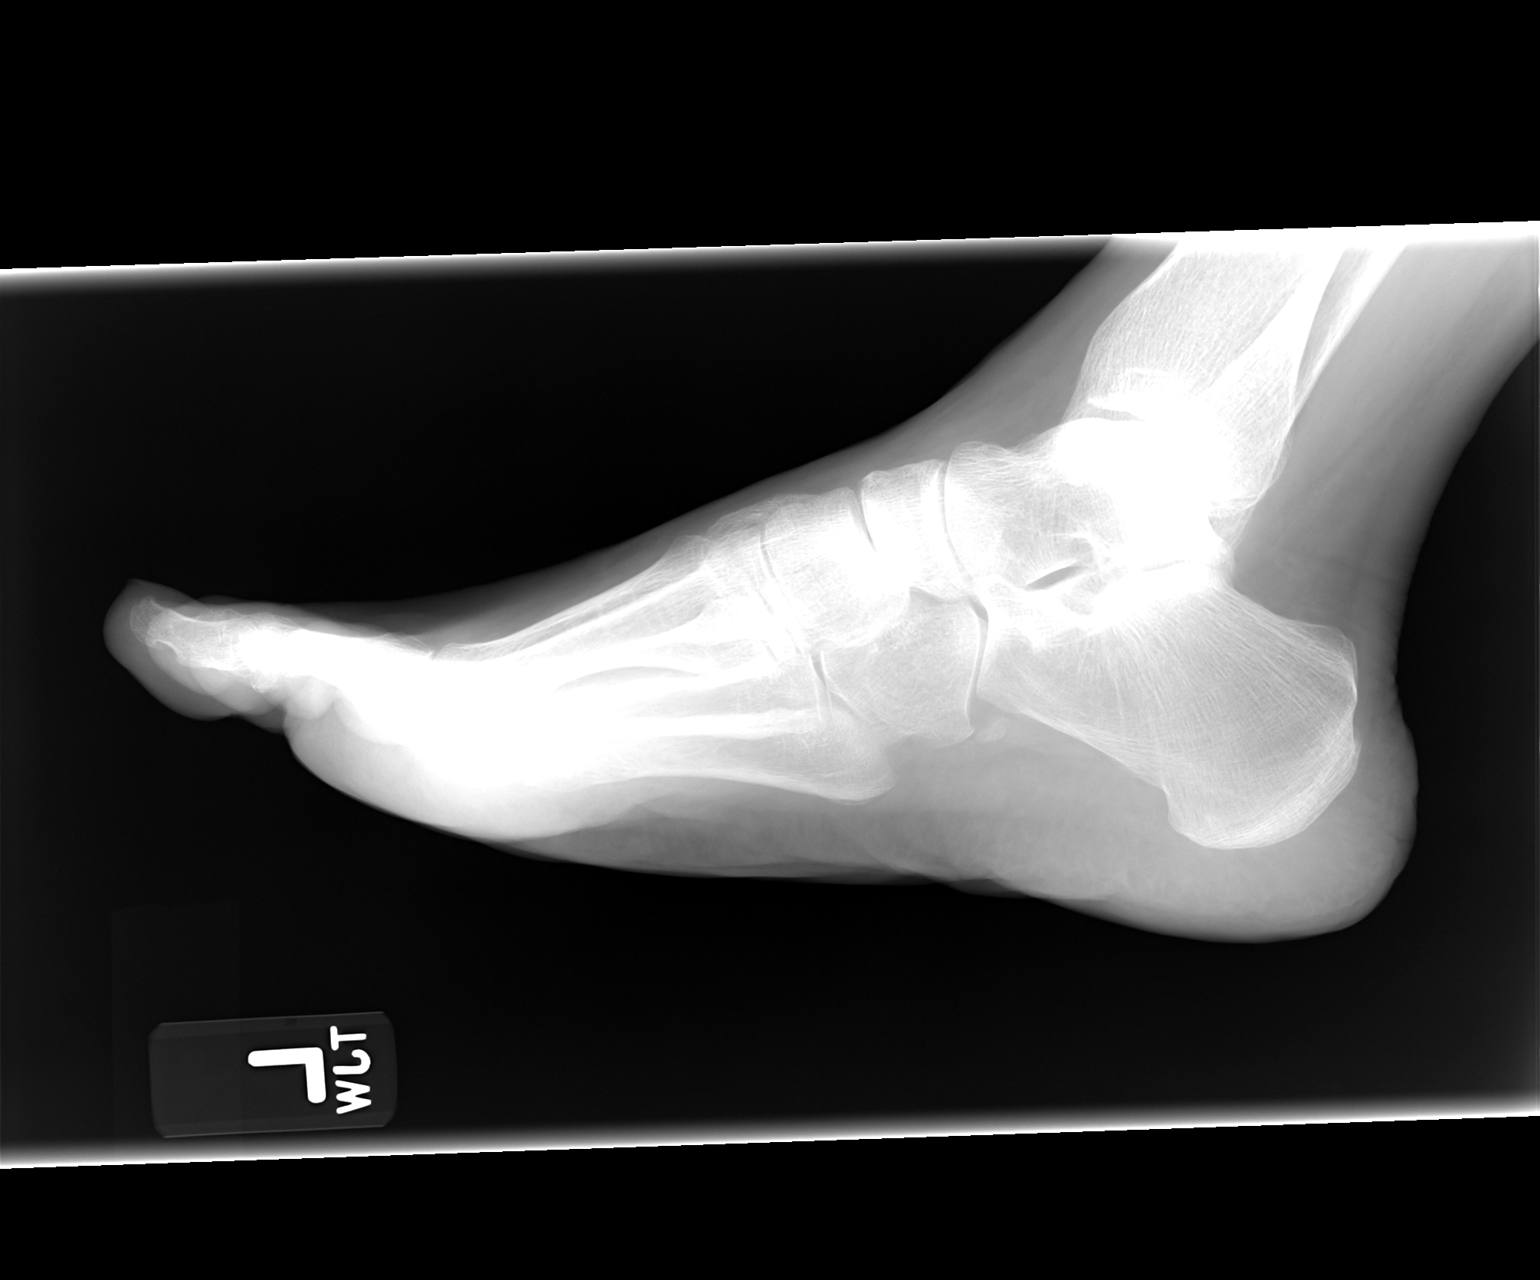

[3 of 3 positions shown; findings below may reference images not displayed]

FINDINGS: Three views of the left foot submitted. No acute fracture or
subluxation. No radiopaque foreign body.
IMPRESSION: Negative.

## 2014-01-07 ENCOUNTER — Encounter: Payer: Self-pay | Admitting: Family Medicine

## 2014-01-08 ENCOUNTER — Encounter: Payer: Self-pay | Admitting: *Deleted

## 2014-02-13 ENCOUNTER — Telehealth: Payer: Self-pay

## 2014-02-13 NOTE — Telephone Encounter (Signed)
pt left /vm requesting date of last tetanus shot; left v/m last Td was 07/03/2006.

## 2014-02-23 ENCOUNTER — Other Ambulatory Visit: Payer: Self-pay | Admitting: Family Medicine

## 2014-04-14 ENCOUNTER — Encounter: Payer: Federal, State, Local not specified - PPO | Admitting: Family Medicine

## 2014-05-01 ENCOUNTER — Telehealth: Payer: Self-pay | Admitting: Family Medicine

## 2014-05-01 ENCOUNTER — Encounter: Payer: Self-pay | Admitting: Family Medicine

## 2014-05-01 ENCOUNTER — Ambulatory Visit (INDEPENDENT_AMBULATORY_CARE_PROVIDER_SITE_OTHER): Payer: Medicare Other | Admitting: Family Medicine

## 2014-05-01 VITALS — BP 136/78 | HR 61 | Temp 98.0°F | Wt 156.0 lb

## 2014-05-01 DIAGNOSIS — J209 Acute bronchitis, unspecified: Secondary | ICD-10-CM | POA: Diagnosis not present

## 2014-05-01 DIAGNOSIS — Z23 Encounter for immunization: Secondary | ICD-10-CM | POA: Diagnosis not present

## 2014-05-01 DIAGNOSIS — R52 Pain, unspecified: Secondary | ICD-10-CM | POA: Diagnosis not present

## 2014-05-01 MED ORDER — AZITHROMYCIN 250 MG PO TABS: ORAL_TABLET | ORAL | Status: DC

## 2014-05-01 NOTE — Progress Notes (Signed)
Pre visit review using our clinic review tool, if applicable. No additional management support is needed unless otherwise documented below in the visit note. 

## 2014-05-01 NOTE — Telephone Encounter (Signed)
Pt has appt with Dr Glori Bickers today at 11:45 am.

## 2014-05-01 NOTE — Patient Instructions (Signed)
I think you have bronchitis  Since it has been several weeks -take zpak as directed Drink fluids  Try mucinex DM over the counter for cough  I think you are have some fever - causing aches and pains (tylenol or ibuprofen is ok) Rest   If the bronchitis gets better but pain does not - alert me so we can schedule a lab appt

## 2014-05-01 NOTE — Telephone Encounter (Signed)
Pt was seen

## 2014-05-01 NOTE — Progress Notes (Signed)
Subjective:    Patient ID: Susan Woods, female    DOB: [birth year], 69 y.o.   MRN: [id number]  HPI Here with some body aches   [facility] it rad to the R arm and tingly fingers Then little pains coming and going in back/waist and joints  Really achey in back and chest after a coughing spell last night when she laid down Also L wrist hurts (has fx it twice)-it swells and bothers her intermittently  Also has a cough  Tight in chest  Coughing up phlegm   Not a lot of nasal symptoms   Chills on the inside - ? 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 Hypertensive retinopathy of both eyes, grade 1 12/16/2013  . Bilateral foot pain 01/30/2013  . Encounter for Medicare annual wellness exam 12/31/2012  . Vaginal pain 06/07/2012  . Urethritis 06/07/2012  . Routine general medical examination at a health care facility 02/20/2011  . PRURITUS 04/18/2010  . MENINGIOMA 11/17/2009  . TIA 11/17/2009  . Osteoporosis 03/19/2007  . HYPOGLYCEMIA 06/12/2006  . ANEMIA 06/12/2006  . HYPERTENSION 06/12/2006  . ALLERGIC RHINITIS 06/12/2006  . GERD 06/12/2006   Past Medical History  Diagnosis Date  . Nonallergic rhinitis   . GERD (gastroesophageal reflux disease)   . HTN (hypertension)     echo (8/11): EF 55%, mild MR, mild TR  . Osteoporosis   . Anemia   . Meningioma     small; advised no f/u unless symptomatic by Dr. Loletta Specter 8/11  . Tingling     in hands of ? etiol  . Nasal pruritis     full body w/o rash   . Psoriasis     mild intermittent    Past Surgical History  Procedure Laterality Date  . Cesarean section    . Vesicovaginal fistula closure w/ tah    . Oophorectomy    . Nasal sinus surgery  2002  . Abdominal surgery      adhesions  . Colonoscopy  2006  . Dexa - osteopenia  2005      dexa 2009 - osteoporosis/fairly stable   . Orif finger / thumb fracture  12/06  . Urethral stricutre      dilated  . Hosp tia  8/11  . Small meningioma    . 2d echo  8/11    nml   . Abdominal hysterectomy     History  Substance Use Topics  . Smoking status: Former Smoker    Quit date: 04/08/1987  . Smokeless tobacco: Never Used  . Alcohol Use: Yes     Comment: rarely   Family History  Problem Relation Age of Onset  . Cancer Father     throat CA  . Diabetes Father   . Heart disease Mother     MI late 74s - CHF   . Hypertension Mother   . Stroke Brother   . Diabetes Brother   . Heart disease Sister     CHF  . Diabetes Sister   . Hypertension Brother   . Diabetes Brother    Allergies  Allergen Reactions  . Indomethacin     REACTION: swelling, rash  . Levofloxacin Other (See Comments)  Sees spots  . Penicillins     REACTION: urticaria (hives)  . Sulfonamide Derivatives     REACTION: tingling around mouth and hands felt tight   Current Outpatient Prescriptions on File Prior to Visit  Medication Sig Dispense Refill  . amLODipine (NORVASC) 10 MG tablet Take 1 tablet by mouth  daily 90 tablet 1  . Calcium Carb-Cholecalciferol (CALCIUM 600 + D PO) Take 1 tablet by mouth 2 (two) times daily.    . Cholecalciferol (VITAMIN D3) 1000 UNITS CAPS Take 1 capsule by mouth daily.      . Ferrous Sulfate (FE-CAPS) 250 MG CPCR Take 1 capsule by mouth daily.      . fish oil-omega-3 fatty acids 1000 MG capsule Take 2 g by mouth every other day. Alternate with flax oil     . Flaxseed, Linseed, (FLAX SEED OIL PO) Take by mouth as directed. Alternate with fish oil     . fluticasone (FLONASE) 50 MCG/ACT nasal spray Place 2 sprays into the nose daily as needed.    Marland Kitchen levocetirizine (XYZAL) 5 MG tablet TAKE 1 TABLET (5 MG TOTAL) BY MOUTH DAILY AS NEEDED. 90 tablet 0  . metoprolol (LOPRESSOR) 100 MG tablet Take one-half tablet by  mouth twice a day 90 tablet 1  . Misc Natural Products  (ECHINACEA COMPOUND) LIQD 10 drops daily in the morning during fall/winter    . mometasone (ELOCON) 0.1 % ointment Apply topically daily. To affected areas as needed 45 g 3  . Multiple Vitamin (MULTIVITAMIN) tablet Take 1 tablet by mouth daily.      . NON FORMULARY similisan dry eye drops and ear drops. UAD PRN      No current facility-administered medications on file prior to visit.     Review of Systems Review of Systems  Constitutional: Negative for fever, appetite change, fatigue and unexpected weight change. pos for body aches and malaise  ENT pos for cong and rhinorrhea , neg for sinus pain  Eyes: Negative for pain and visual disturbance.  Respiratory: Negative for wheeze and shortness of breath.   Cardiovascular: Negative for cp or palpitations    Gastrointestinal: Negative for nausea, diarrhea and constipation.  Genitourinary: Negative for urgency and frequency.  Skin: Negative for pallor or rash   Neurological: Negative for weakness, light-headedness, numbness and headaches.  Hematological: Negative for adenopathy. Does not bruise/bleed easily.  Psychiatric/Behavioral: Negative for dysphoric mood. The patient is not nervous/anxious.         Objective:   Physical Exam  Constitutional: She appears well-developed and well-nourished. No distress.  HENT:  Head: Normocephalic and atraumatic.  Right Ear: External ear normal.  Left Ear: External ear normal.  Mouth/Throat: Oropharynx is clear and moist. No oropharyngeal exudate.  Nares are injected and congested   No sinus tenderness   Eyes: Conjunctivae and EOM are normal. Pupils are equal, round, and reactive to light. Right eye exhibits no discharge. Left eye exhibits no discharge.  Neck: Normal range of motion. Neck supple.  Cardiovascular: Normal rate and regular rhythm.   Pulmonary/Chest: Effort normal and breath sounds normal. No respiratory distress. She has no wheezes. She has no rales. She exhibits no tenderness.    Harsh bs with scattered rhonchi  Lymphadenopathy:    She has no cervical adenopathy.  Neurological: She is alert.  Skin: Skin is warm and dry. No rash noted. No erythema. No pallor.  Psychiatric: She has a normal mood and affect.  Assessment & Plan:   Problem List Items Addressed This Visit      Respiratory   Acute bronchitis    Respiratory symptoms along with body aches and malaise  Cover with zpak in light of duration Disc symptomatic care - see instructions on AVS  Update if not starting to improve in a week or if worsening          Other   Body aches    Other Visit Diagnoses    Need for prophylactic vaccination and inoculation against influenza    -  Primary    Relevant Orders    Flu Vaccine QUAD 36+ mos PF IM (Fluarix Quad PF) (Completed)

## 2014-05-01 NOTE — Telephone Encounter (Signed)
[facility] Patient Name: Susan Woods DOB: 10-08-1945 Initial Comment caller states she had back and chest pain last night, none now Nurse Assessment Nurse: Markus Daft, RN, Sherre Poot Date/Time (Eastern Time): 05/01/2014 8:12:59 AM Confirm and document reason for call. If symptomatic, describe symptoms. ---Caller states she had upper back and chest pain, dull but strong, last night while laying down for bed for the night. Last about 1 min. When she sat up, she started to belch, and passed some gas. No other s/s with it. Denies pain now. - Sometimes achy muscles noted after exercise but this episode was different. Has the patient traveled out of the country within the last 30 days? ---Not Applicable Does the patient require triage? ---Yes Related visit to physician within the last 2 weeks? ---No Does the PT have any chronic conditions? (i.e. diabetes, asthma, etc.) ---Yes List chronic conditions. ---HTN Guidelines Guideline Title Affirmed Question Affirmed Notes Chest Pain [1] Chest pain lasting <= 5 minutes AND [2] NO chest pain or cardiac symptoms now (Exceptions: pains lasting a few seconds) Final Disposition User See Physician within 24 Hours [place], [place], [place] Comments Appt at 11:45 am with Dr. Glori Bickers already made by patient.

## 2014-05-02 ENCOUNTER — Encounter (HOSPITAL_COMMUNITY): Payer: Self-pay | Admitting: *Deleted

## 2014-05-02 ENCOUNTER — Emergency Department (HOSPITAL_COMMUNITY)
Admission: EM | Admit: 2014-05-02 | Discharge: 2014-05-03 | Disposition: A | Discharge status: Home / Self Care | Payer: Medicare Other | Attending: Emergency Medicine | Admitting: Emergency Medicine

## 2014-05-02 DIAGNOSIS — Z79899 Other long term (current) drug therapy: Secondary | ICD-10-CM | POA: Diagnosis not present

## 2014-05-02 DIAGNOSIS — Z8719 Personal history of other diseases of the digestive system: Secondary | ICD-10-CM | POA: Diagnosis not present

## 2014-05-02 DIAGNOSIS — R110 Nausea: Secondary | ICD-10-CM | POA: Diagnosis not present

## 2014-05-02 DIAGNOSIS — Z872 Personal history of diseases of the skin and subcutaneous tissue: Secondary | ICD-10-CM | POA: Diagnosis not present

## 2014-05-02 DIAGNOSIS — R0789 Other chest pain: Secondary | ICD-10-CM | POA: Diagnosis not present

## 2014-05-02 DIAGNOSIS — Z7951 Long term (current) use of inhaled steroids: Secondary | ICD-10-CM | POA: Diagnosis not present

## 2014-05-02 DIAGNOSIS — Z880 Allergy status to penicillin: Secondary | ICD-10-CM | POA: Diagnosis not present

## 2014-05-02 DIAGNOSIS — R251 Tremor, unspecified: Secondary | ICD-10-CM | POA: Diagnosis not present

## 2014-05-02 DIAGNOSIS — M810 Age-related osteoporosis without current pathological fracture: Secondary | ICD-10-CM | POA: Insufficient documentation

## 2014-05-02 DIAGNOSIS — R0600 Dyspnea, unspecified: Secondary | ICD-10-CM | POA: Diagnosis not present

## 2014-05-02 DIAGNOSIS — Z87891 Personal history of nicotine dependence: Secondary | ICD-10-CM | POA: Insufficient documentation

## 2014-05-02 DIAGNOSIS — Z8709 Personal history of other diseases of the respiratory system: Secondary | ICD-10-CM | POA: Diagnosis not present

## 2014-05-02 DIAGNOSIS — R079 Chest pain, unspecified: Secondary | ICD-10-CM | POA: Diagnosis present

## 2014-05-02 DIAGNOSIS — D649 Anemia, unspecified: Secondary | ICD-10-CM | POA: Insufficient documentation

## 2014-05-02 DIAGNOSIS — I10 Essential (primary) hypertension: Secondary | ICD-10-CM | POA: Diagnosis not present

## 2014-05-02 DIAGNOSIS — R0602 Shortness of breath: Secondary | ICD-10-CM | POA: Insufficient documentation

## 2014-05-02 NOTE — ED Notes (Signed)
The pt thinks she is having an allergic reaction  To zithromax.  She saw her doctor yesterday and was given a z-pak.  She had 2 tablets yesterday and one tonight.  When she lay down to sleep she started having chest tightness and shaking all over her body.  She is still nervous and jitttery here in the ed and  Feels like she cannot   Get her breath

## 2014-05-03 ENCOUNTER — Emergency Department (HOSPITAL_COMMUNITY): Payer: Medicare Other

## 2014-05-03 DIAGNOSIS — R0600 Dyspnea, unspecified: Secondary | ICD-10-CM | POA: Diagnosis not present

## 2014-05-03 LAB — CBC WITH DIFFERENTIAL/PLATELET
BASOS PCT: 0 % (ref 0–1)
Basophils Absolute: 0 10*3/uL (ref 0.0–0.1)
Eosinophils Absolute: 0.2 10*3/uL (ref 0.0–0.7)
Eosinophils Relative: 3 % (ref 0–5)
HEMATOCRIT: 34.7 % — AB (ref 36.0–46.0)
Hemoglobin: 11.9 g/dL — ABNORMAL LOW (ref 12.0–15.0)
Lymphocytes Relative: 31 % (ref 12–46)
Lymphs Abs: 1.9 10*3/uL (ref 0.7–4.0)
MCH: 29.4 pg (ref 26.0–34.0)
MCHC: 34.3 g/dL (ref 30.0–36.0)
MCV: 85.7 fL (ref 78.0–100.0)
MONOS PCT: 7 % (ref 3–12)
Monocytes Absolute: 0.5 10*3/uL (ref 0.1–1.0)
Neutro Abs: 3.6 10*3/uL (ref 1.7–7.7)
Neutrophils Relative %: 59 % (ref 43–77)
Platelets: 196 10*3/uL (ref 150–400)
RBC: 4.05 MIL/uL (ref 3.87–5.11)
RDW: 14 % (ref 11.5–15.5)
WBC: 6.1 10*3/uL (ref 4.0–10.5)

## 2014-05-03 LAB — BRAIN NATRIURETIC PEPTIDE: B Natriuretic Peptide: 19.1 pg/mL (ref 0.0–100.0)

## 2014-05-03 LAB — BASIC METABOLIC PANEL
Anion gap: 10 (ref 5–15)
BUN: 12 mg/dL (ref 6–23)
CALCIUM: 9.2 mg/dL (ref 8.4–10.5)
CHLORIDE: 108 mmol/L (ref 96–112)
CO2: 22 mmol/L (ref 19–32)
CREATININE: 0.78 mg/dL (ref 0.50–1.10)
GFR calc Af Amer: >90 mL/min (ref >90)
GFR, EST NON AFRICAN AMERICAN: 85 mL/min — AB (ref >90)
GLUCOSE: 113 mg/dL — AB (ref 70–99)
POTASSIUM: 3.5 mmol/L (ref 3.5–5.1)
Sodium: 140 mmol/L (ref 135–145)

## 2014-05-03 LAB — TROPONIN I

## 2014-05-03 LAB — URINALYSIS, ROUTINE W REFLEX MICROSCOPIC
Bilirubin Urine: NEGATIVE
GLUCOSE, UA: NEGATIVE mg/dL
HGB URINE DIPSTICK: NEGATIVE
Ketones, ur: NEGATIVE mg/dL
Leukocytes, UA: NEGATIVE
NITRITE: NEGATIVE
Protein, ur: NEGATIVE mg/dL
Specific Gravity, Urine: 1.006 (ref 1.005–1.030)
UROBILINOGEN UA: 0.2 mg/dL (ref 0.0–1.0)
pH: 6.5 (ref 5.0–8.0)

## 2014-05-03 NOTE — Assessment & Plan Note (Signed)
Respiratory symptoms along with body aches and malaise  Cover with zpak in light of duration Disc symptomatic care - see instructions on AVS  Update if not starting to improve in a week or if worsening

## 2014-05-03 NOTE — Discharge Instructions (Signed)
We saw you in the ER for the chest pain/shortness of breath. All of our cardiac workup is normal, including labs, EKG and chest X-RAY are normal. We are not sure what is causing your discomfort, but we feel comfortable sending you home at this time. The workup in the ER is not complete, and you should follow up with your primary care doctor for further evaluation.   Shortness of Breath Shortness of breath means you have trouble breathing. It could also mean that you have a medical problem. You should get immediate medical care for shortness of breath. CAUSES   Not enough oxygen in the air such as with high altitudes or a smoke-filled room.  Certain lung diseases, infections, or problems.  Heart disease or conditions, such as angina or heart failure.  Low red blood cells (anemia).  Poor physical fitness, which can cause shortness of breath when you exercise.  Chest or back injuries or stiffness.  Being overweight.  Smoking.  Anxiety, which can make you feel like you are not getting enough air. DIAGNOSIS  Serious medical problems can often be found during your physical exam. Tests may also be done to determine why you are having shortness of breath. Tests may include:  Chest X-rays.  Lung function tests.  Blood tests.  An electrocardiogram (ECG).  An ambulatory electrocardiogram. An ambulatory ECG records your heartbeat patterns over a 24-hour period.  Exercise testing.  A transthoracic echocardiogram (TTE). During echocardiography, sound waves are used to evaluate how blood flows through your heart.  A transesophageal echocardiogram (TEE).  Imaging scans. Your health care provider may not be able to find a cause for your shortness of breath after your exam. In this case, it is important to have a follow-up exam with your health care provider as directed.  TREATMENT  Treatment for shortness of breath depends on the cause of your symptoms and can vary greatly. HOME CARE  INSTRUCTIONS   Do not smoke. Smoking is a common cause of shortness of breath. If you smoke, ask for help to quit.  Avoid being around chemicals or things that may bother your breathing, such as paint fumes and dust.  Rest as needed. Slowly resume your usual activities.  If medicines were prescribed, take them as directed for the full length of time directed. This includes oxygen and any inhaled medicines.  Keep all follow-up appointments as directed by your health care provider. SEEK MEDICAL CARE IF:   Your condition does not improve in the time expected.  You have a hard time doing your normal activities even with rest.  You have any new symptoms. SEEK IMMEDIATE MEDICAL CARE IF:   Your shortness of breath gets worse.  You feel light-headed, faint, or develop a cough not controlled with medicines.  You start coughing up blood.  You have pain with breathing.  You have chest pain or pain in your arms, shoulders, or abdomen.  You have a fever.  You are unable to walk up stairs or exercise the way you normally do. MAKE SURE YOU:  Understand these instructions.  Will watch your condition.  Will get help right away if you are not doing well or get worse. Document Released: 11/22/2000 Document Revised: 03/04/2013 Document Reviewed: 05/15/2011 [facility] Patient Information 2015 [place], [place]. This information is not intended to replace advice given to you by your health care provider. Make sure you discuss any questions you have with your health care provider.

## 2014-05-03 NOTE — ED Notes (Signed)
Pt has returned from radiology.  

## 2014-05-03 NOTE — ED Provider Notes (Signed)
CSN: [id number]     Arrival date & time 05/02/14  2327 History  This chart was scribed for Varney Biles, MD by Chester Holstein, ED Scribe. This patient was seen in room A05C/A05C and the patient's care was started at 12:11 AM.    Chief Complaint  Patient presents with  . Chest Pain     Patient is a 69 y.o. female presenting with chest pain. The history is provided by the patient. No language interpreter was used.  Chest Pain Associated symptoms: nausea and shortness of breath   Associated symptoms: no abdominal pain, no diaphoresis and no fever    HPI Comments: Susan Woods is a 69 y.o. female with PMHx of GERD, HTN, osteoporosis, anemia, nasal puruitis, psoriasis who presents to the Emergency Department complaining of recurrent chest tighteness with first onset 3 days ago. Pt states pain returned this evening at 10:41 PM as she was laying down to sleep. Pt states sitting up alleviates the pain. Pt notes associated nausea, SOB and tremors, with pain radiation to her back which resolved after arrival to ED.  Pt states she was seen by PCP yesterday for chills, rhinorrhea, congestion, and cough. Pt was started on a Zpack for URI starting yesterday. Pt denies h/o CA, PE, and DVT, prolonged travel, recent surgeries diaphoresis, abdominal, chills,  fever. No hx of allergies. Pt thinks possibly her reaction was to azithromycin that she took today. She had no sx when she took the meds yday. There was no wheezing, tongue or lip swelling, or drooling.  Past Medical History  Diagnosis Date  . Nonallergic rhinitis   . GERD (gastroesophageal reflux disease)   . HTN (hypertension)     echo (8/11): EF 55%, mild MR, mild TR  . Osteoporosis   . Anemia   . Meningioma     small; advised no f/u unless symptomatic by Dr. Loletta Specter 8/11  . Tingling     in hands of ? etiol  . Nasal pruritis     full body w/o rash   . Psoriasis     mild intermittent    Past Surgical History  Procedure  Laterality Date  . Cesarean section    . Vesicovaginal fistula closure w/ tah    . Oophorectomy    . Nasal sinus surgery  2002  . Abdominal surgery      adhesions  . Colonoscopy  2006  . Dexa - osteopenia  2005    dexa 2009 - osteoporosis/fairly stable   . Orif finger / thumb fracture  12/06  . Urethral stricutre      dilated  . Hosp tia  8/11  . Small meningioma    . 2d echo  8/11    nml   . Abdominal hysterectomy     Family History  Problem Relation Age of Onset  . Cancer Father     throat CA  . Diabetes Father   . Heart disease Mother     MI late 52s - CHF   . Hypertension Mother   . Stroke Brother   . Diabetes Brother   . Heart disease Sister     CHF  . Diabetes Sister   . Hypertension Brother   . Diabetes Brother    History  Substance Use Topics  . Smoking status: Former Smoker    Quit date: 04/08/1987  . Smokeless tobacco: Never Used  . Alcohol Use: Yes     Comment: rarely   OB History    No data  available     Review of Systems  Constitutional: Negative for fever, chills and diaphoresis.  Respiratory: Positive for shortness of breath.   Cardiovascular: Positive for chest pain.  Gastrointestinal: Positive for nausea. Negative for abdominal pain.  Neurological: Positive for tremors.      Allergies  Indomethacin; Levofloxacin; Penicillins; and Sulfonamide derivatives  Home Medications   Prior to Admission medications   Medication Sig Start Date End Date Taking? Authorizing Provider  amLODipine (NORVASC) 10 MG tablet Take 1 tablet by mouth  daily 02/24/14   Abner Greenspan, MD  azithromycin (ZITHROMAX Z-PAK) 250 MG tablet Take 2 pills by mouth today and then 1 pill daily for 4 days 05/01/14   Abner Greenspan, MD  Calcium Carb-Cholecalciferol (CALCIUM 600 + D PO) Take 1 tablet by mouth 2 (two) times daily.    Historical Provider, MD  Cholecalciferol (VITAMIN D3) 1000 UNITS CAPS Take 1 capsule by mouth daily.      Historical Provider, MD  Ferrous Sulfate  (FE-CAPS) 250 MG CPCR Take 1 capsule by mouth daily.      Historical Provider, MD  fish oil-omega-3 fatty acids 1000 MG capsule Take 2 g by mouth every other day. Alternate with flax oil     Historical Provider, MD  Flaxseed, Linseed, (FLAX SEED OIL PO) Take by mouth as directed. Alternate with fish oil     Historical Provider, MD  fluticasone (FLONASE) 50 MCG/ACT nasal spray Place 2 sprays into the nose daily as needed. 10/31/12   Abner Greenspan, MD  levocetirizine (XYZAL) 5 MG tablet TAKE 1 TABLET (5 MG TOTAL) BY MOUTH DAILY AS NEEDED. 10/31/12   Abner Greenspan, MD  metoprolol (LOPRESSOR) 100 MG tablet Take one-half tablet by  mouth twice a day 02/24/14   Abner Greenspan, MD  Misc Natural Products [facility] COMPOUND) LIQD 10 drops daily in the morning during fall/winter    Historical Provider, MD  mometasone (ELOCON) 0.1 % ointment Apply topically daily. To affected areas as needed 11/07/13   Abner Greenspan, MD  Multiple Vitamin (MULTIVITAMIN) tablet Take 1 tablet by mouth daily.      Historical Provider, MD  NON FORMULARY similisan dry eye drops and ear drops. UAD PRN     Historical Provider, MD   BP 150/79 mmHg  Pulse 90  Temp(Src) 98.4 F (36.9 C)  Resp 22  SpO2 100% Physical Exam  Constitutional: She is oriented to person, place, and time. She appears well-developed and well-nourished.  HENT:  Head: Normocephalic.  Mouth/Throat: Oropharynx is clear and moist.  Eyes: Conjunctivae are normal.  Neck: Normal range of motion. Neck supple. No JVD present.  Cardiovascular: Normal rate, regular rhythm and normal heart sounds.  Exam reveals no friction rub.   No murmur heard. Pulmonary/Chest: Effort normal and breath sounds normal. No stridor. No respiratory distress. She has no wheezes. She has no rhonchi. She has no rales.  Abdominal: Soft. Bowel sounds are normal. She exhibits no distension. There is no tenderness.  Musculoskeletal: Normal range of motion. She exhibits no edema or tenderness.        Right lower leg: She exhibits no swelling and no edema.       Left lower leg: She exhibits no swelling and no edema.  Neurological: She is alert and oriented to person, place, and time.  Skin: Skin is warm and dry.  Psychiatric: She has a normal mood and affect. Her behavior is normal.  Nursing note and vitals reviewed.  ED Course  Procedures (including critical care time) DIAGNOSTIC STUDIES: Oxygen Saturation is 100% on room air , normal by my interpretation.    COORDINATION OF CARE: 12:23 AM Discussed treatment plan with patient at beside, the patient agrees with the plan and has no further questions at this time.   Labs Review Labs Reviewed  CBC WITH DIFFERENTIAL/PLATELET  BASIC METABOLIC PANEL  BRAIN NATRIURETIC PEPTIDE  TROPONIN I    Imaging Review No results found.   EKG Interpretation   Date/Time:  Saturday May 02 2014 23:33:00 EST Ventricular Rate:  91 PR Interval:  150 QRS Duration: 72 QT Interval:  362 QTC Calculation: 445 R Axis:   53 Text Interpretation:  Normal sinus rhythm No old ekg Non-specific  abnormality, ST segment, and/or T-wave Confirmed by Kathrynn Humble, MD, Thelma Comp  [phone]) on 05/03/2014 12:07:14 AM      MDM   Final diagnoses:  Dyspnea    I personally performed the services described in this documentation, which was scribed in my presence. The recorded information has been reviewed and is accurate.  Pt comes in w/ cc of dyspnea.  Sudden onset, when she was laying down. Her sx worse worse when supine compared to sitting up. Also started on zpack yday, and twday was day 2 of meds. Having some uri like sx. Current exam not indicative of any allergic type reaction. BNP ordered, as her dib was worse with laying, and is normal, and CXR is clear. Trops x 2 ordered, and they are normal too. Pt observed for extended period of time and there is nio return of sx. Stable for discharge    Varney Biles, MD 05/03/14 [phone]

## 2014-05-03 NOTE — ED Notes (Signed)
Pt states that she has had some urinary frequency since Thursday.

## 2014-05-03 NOTE — ED Notes (Signed)
Pt ambulated in hallway with steady gait. Pt satted between 98%-100% while ambulation.

## 2014-05-04 ENCOUNTER — Telehealth: Payer: Self-pay | Admitting: Family Medicine

## 2014-05-04 NOTE — Telephone Encounter (Signed)
Pt came to see dr tower on Friday she gave her a rx  zpak   Pt stated she took 2 Friday night and she had diarrhea and took one on sat.  Pt stated she started shaking and she went to [place] Saturday for tightness in chest.  They did ekg.  The er dr wasn't for sure if it was a reaction to zpak.  Chest xray was clear. She wanted to let you know she stopped zpak. She is feeling fine today.

## 2014-05-04 NOTE — Telephone Encounter (Signed)
Spoken to patient and she is feeling much better. zpak is noted as well.

## 2014-05-04 NOTE — Telephone Encounter (Signed)
I'm glad she is feeling better -please note zpak on med intol list if not done already

## 2014-05-07 ENCOUNTER — Other Ambulatory Visit: Payer: Self-pay | Admitting: Family Medicine

## 2014-05-18 ENCOUNTER — Ambulatory Visit (INDEPENDENT_AMBULATORY_CARE_PROVIDER_SITE_OTHER)
Admission: RE | Admit: 2014-05-18 | Discharge: 2014-05-18 | Disposition: A | Discharge status: Home / Self Care | Payer: Medicare Other | Source: Ambulatory Visit | Attending: Family Medicine | Admitting: Family Medicine

## 2014-05-18 ENCOUNTER — Encounter: Payer: Self-pay | Admitting: Family Medicine

## 2014-05-18 ENCOUNTER — Ambulatory Visit (INDEPENDENT_AMBULATORY_CARE_PROVIDER_SITE_OTHER): Payer: Medicare Other | Admitting: Family Medicine

## 2014-05-18 VITALS — BP 148/90 | HR 78 | Temp 97.9°F | Ht 65.25 in | Wt 155.8 lb

## 2014-05-18 DIAGNOSIS — M545 Low back pain, unspecified: Secondary | ICD-10-CM

## 2014-05-18 NOTE — Progress Notes (Signed)
Subjective:    Patient ID: Susan Woods, female    DOB: 1945-12-22, 69 y.o.   MRN: [id number]  HPI Here today for ongoing pain in her back  Pain in sides and low back pain (not in abdomen)  No more chest pain   Sometimes pain radiates down to vaginal area (in the past she experienced this and gyn told her it was from her back )  Pain was originally sharp-now dull and intermittent  Worse with sitting or lying down  Ok to to exercise walk   No pain shooting down her legs No numbness or weakness    Last visit - dx with bronchitis - given zpack for that  It made her shaky  Ended up in ED for cp -nl cxr and EKG and r/o for MI  Thinks it was a reaction to the zpak    Patient Active Problem List   Diagnosis Date Noted  . Low back pain 05/18/2014  . Acute bronchitis 05/01/2014  . Body aches 05/01/2014  . Hypertensive retinopathy of both eyes, grade 1 12/16/2013  . Bilateral foot pain 01/30/2013  . Encounter for Medicare annual wellness exam 12/31/2012  . Vaginal pain 06/07/2012  . Urethritis 06/07/2012  . Routine general medical examination at a health care facility 02/20/2011  . PRURITUS 04/18/2010  . MENINGIOMA 11/17/2009  . TIA 11/17/2009  . Osteoporosis 03/19/2007  . HYPOGLYCEMIA 06/12/2006  . ANEMIA 06/12/2006  . HYPERTENSION 06/12/2006  . ALLERGIC RHINITIS 06/12/2006  . GERD 06/12/2006   Past Medical History  Diagnosis Date  . Nonallergic rhinitis   . GERD (gastroesophageal reflux disease)   . HTN (hypertension)     echo (8/11): EF 55%, mild MR, mild TR  . Osteoporosis   . Anemia   . Meningioma     small; advised no f/u unless symptomatic by Dr. Loletta Specter 8/11  . Tingling     in hands of ? etiol  . Nasal pruritis     full body w/o rash   . Psoriasis     mild intermittent    Past Surgical History  Procedure Laterality Date  . Cesarean section    . Vesicovaginal fistula closure w/ tah    . Oophorectomy    . Nasal sinus surgery  2002  .  Abdominal surgery      adhesions  . Colonoscopy  2006  . Dexa - osteopenia  2005    dexa 2009 - osteoporosis/fairly stable   . Orif finger / thumb fracture  12/06  . Urethral stricutre      dilated  . Hosp tia  8/11  . Small meningioma    . 2d echo  8/11    nml   . Abdominal hysterectomy     History  Substance Use Topics  . Smoking status: Former Smoker    Quit date: 04/08/1987  . Smokeless tobacco: Never Used  . Alcohol Use: Yes     Comment: rarely   Family History  Problem Relation Age of Onset  . Cancer Father     throat CA  . Diabetes Father   . Heart disease Mother     MI late 66s - CHF   . Hypertension Mother   . Stroke Brother   . Diabetes Brother   . Heart disease Sister     CHF  . Diabetes Sister   . Hypertension Brother   . Diabetes Brother    Allergies  Allergen Reactions  . Indomethacin  REACTION: swelling, rash  . Levofloxacin Other (See Comments)    Sees spots  . Penicillins     REACTION: urticaria (hives)  . Sulfonamide Derivatives     REACTION: tingling around mouth and hands felt tight  . Zithromax [Azithromycin]    Current Outpatient Prescriptions on File Prior to Visit  Medication Sig Dispense Refill  . amLODipine (NORVASC) 10 MG tablet Take 1 tablet by mouth  daily 90 tablet 1  . Calcium Carb-Cholecalciferol (CALCIUM 600 + D PO) Take 1 tablet by mouth daily.     . Cholecalciferol (VITAMIN D3) 1000 UNITS CAPS Take 1 capsule by mouth daily.      . Ferrous Sulfate (FE-CAPS) 250 MG CPCR Take 1 capsule by mouth daily.      . fish oil-omega-3 fatty acids 1000 MG capsule Take 2 g by mouth 2 (two) times daily. Alternate with flax oil    . Flaxseed, Linseed, (FLAX SEED OIL PO) Take by mouth as directed. Alternate with fish oil     . fluticasone (FLONASE) 50 MCG/ACT nasal spray Place 2 sprays into the nose daily as needed for allergies.     Marland Kitchen levocetirizine (XYZAL) 5 MG tablet TAKE 1 TABLET (5 MG TOTAL) BY MOUTH DAILY AS NEEDED. 90 tablet 0  .  metoprolol (LOPRESSOR) 100 MG tablet Take one-half tablet by  mouth twice a day 90 tablet 1  . Misc Natural Products (ECHINACEA COMPOUND) LIQD 10 drops daily in the morning during fall/winter    . mometasone (ELOCON) 0.1 % ointment Apply topically daily. To affected areas as needed 45 g 3  . Multiple Vitamin (MULTIVITAMIN) tablet Take 1 tablet by mouth daily.      . NON FORMULARY similisan dry eye drops and ear drops. UAD PRN      No current facility-administered medications on file prior to visit.    Review of Systems    Review of Systems  Constitutional: Negative for fever, appetite change, fatigue and unexpected weight change.  Eyes: Negative for pain and visual disturbance.  Respiratory: Negative for cough and shortness of breath.   Cardiovascular: Negative for cp or palpitations    Gastrointestinal: Negative for nausea, diarrhea and constipation.  Genitourinary: Negative for urgency and frequency.  Skin: Negative for pallor or rash   MSK pos for back pain /spasm Neurological: Negative for weakness, light-headedness, numbness and headaches.  Hematological: Negative for adenopathy. Does not bruise/bleed easily.  Psychiatric/Behavioral: Negative for dysphoric mood. The patient is not nervous/anxious.      Objective:   Physical Exam  Constitutional: She appears well-developed and well-nourished. No distress.  HENT:  Head: Normocephalic and atraumatic.  Eyes: Conjunctivae and EOM are normal. Pupils are equal, round, and reactive to light.  Neck: Normal range of motion. Neck supple.  Cardiovascular: Normal rate and regular rhythm.   Musculoskeletal: She exhibits tenderness. She exhibits no edema.       Lumbar back: She exhibits decreased range of motion and tenderness. She exhibits no bony tenderness, no edema and normal pulse.  Tender in peri lumbar musculature bilat (worse on R) Neg SLR bilat  Nl rom hips Pain to flex spine over 90 deg No scoliosis or lordosis (in fact there is  some loss of lordosis)  Nl gait No neuro changes   Neurological: She is alert. She has normal reflexes.  Skin: Skin is warm and dry. No rash noted. No erythema. No pallor.  Psychiatric: She has a normal mood and affect.  Assessment & Plan:   Problem List Items Addressed This Visit      Other   Low back pain - Primary    Recurrent without radiculopathy  LS xray today  Pt does not want medication  Pend result- consider PT ref (she also considers chiropractor) Disc use of heat and stretches       Relevant Orders   DG Lumbar Spine Complete (Completed)

## 2014-05-18 NOTE — Progress Notes (Signed)
Pre visit review using our clinic review tool, if applicable. No additional management support is needed unless otherwise documented below in the visit note. 

## 2014-05-18 NOTE — Patient Instructions (Signed)
Xray of low back today Use a warm compress / heating pad when needed to help tight muscles  Keep stretching  We will make a plan after the xray is read

## 2014-05-18 NOTE — Assessment & Plan Note (Signed)
Recurrent without radiculopathy  LS xray today  Pt does not want medication  Pend result- consider PT ref (she also considers chiropractor) Disc use of heat and stretches

## 2014-05-19 ENCOUNTER — Telehealth: Payer: Self-pay

## 2014-05-19 NOTE — Telephone Encounter (Signed)
-----   Message from Abner Greenspan, MD sent at 05/18/2014  6:13 PM EST ----- Spine film looks ok  Next step would be a physical therapy referral or she can seek out a chiropractor first if she prefers Let me know what she wants to do  Thanks

## 2014-05-19 NOTE — Telephone Encounter (Signed)
Patient aware of xray results.  She is going to think about next step and will call back.

## 2014-05-21 NOTE — Telephone Encounter (Signed)
Pt called and would like to wait at this time for any additional referrals to PT or chiropractor.  States that her discomfort comes and goes.  She will call us back if needed.  Best number to call pt is [phone]  / lt

## 2014-05-21 NOTE — Telephone Encounter (Signed)
[place]

## 2014-08-03 ENCOUNTER — Telehealth: Payer: Self-pay

## 2014-08-03 MED ORDER — CYCLOBENZAPRINE HCL 10 MG PO TABS: 5.0000 mg | ORAL_TABLET | Freq: Three times a day (TID) | ORAL | Status: DC | PRN

## 2014-08-03 NOTE — Telephone Encounter (Signed)
Pt left v/m; pt was last seen 05/18/2014 with back and side pain; pt has had flair up of pain and pt request muscle relaxant to [facility]; pt has been taking ibuprofen. Please advise.

## 2014-08-03 NOTE — Telephone Encounter (Signed)
Flexeril sent  Caution of sedation  F/u if worse or no imp

## 2014-08-03 NOTE — Telephone Encounter (Signed)
Left voicemail letting pt know Rx sent to pharmacy and advise of Dr. Marliss Coots recommendations

## 2014-08-26 ENCOUNTER — Encounter: Payer: Self-pay | Admitting: Family Medicine

## 2014-08-26 ENCOUNTER — Ambulatory Visit (INDEPENDENT_AMBULATORY_CARE_PROVIDER_SITE_OTHER): Payer: Medicare Other | Admitting: Family Medicine

## 2014-08-26 VITALS — BP 140/86 | HR 65 | Temp 98.3°F | Ht 65.25 in | Wt 154.2 lb

## 2014-08-26 DIAGNOSIS — M2550 Pain in unspecified joint: Secondary | ICD-10-CM | POA: Diagnosis not present

## 2014-08-26 DIAGNOSIS — M791 Myalgia, unspecified site: Secondary | ICD-10-CM | POA: Insufficient documentation

## 2014-08-26 LAB — CBC WITH DIFFERENTIAL/PLATELET
BASOS ABS: 0 10*3/uL (ref 0.0–0.1)
Basophils Relative: 0.4 % (ref 0.0–3.0)
Eosinophils Absolute: 0.2 10*3/uL (ref 0.0–0.7)
Eosinophils Relative: 3.2 % (ref 0.0–5.0)
HCT: 37 % (ref 36.0–46.0)
HEMOGLOBIN: 12.3 g/dL (ref 12.0–15.0)
LYMPHS PCT: 42.9 % (ref 12.0–46.0)
Lymphs Abs: 2.4 10*3/uL (ref 0.7–4.0)
MCHC: 33.2 g/dL (ref 30.0–36.0)
MCV: 88.1 fl (ref 78.0–100.0)
MONOS PCT: 6 % (ref 3.0–12.0)
Monocytes Absolute: 0.3 10*3/uL (ref 0.1–1.0)
NEUTROS PCT: 47.5 % (ref 43.0–77.0)
Neutro Abs: 2.7 10*3/uL (ref 1.4–7.7)
PLATELETS: 224 10*3/uL (ref 150.0–400.0)
RBC: 4.2 Mil/uL (ref 3.87–5.11)
RDW: 14.4 % (ref 11.5–15.5)
WBC: 5.7 10*3/uL (ref 4.0–10.5)

## 2014-08-26 LAB — SEDIMENTATION RATE: Sed Rate: 29 mm/hr — ABNORMAL HIGH (ref 0–22)

## 2014-08-26 LAB — CK: Total CK: 74 U/L (ref 7–177)

## 2014-08-26 NOTE — Progress Notes (Signed)
Pre visit review using our clinic review tool, if applicable. No additional management support is needed unless otherwise documented below in the visit note. 

## 2014-08-26 NOTE — Patient Instructions (Signed)
Labs for muscle and joint pain today  Stay limber - stretch before and after exercise Continue heat and epsom salts Stay active   Will update with results

## 2014-08-26 NOTE — Assessment & Plan Note (Signed)
achey- after exercise or inactivity without swelling/redness/rash or hx of tick bite Lab today-for autoimmune dz and pmr  Disc dynamic and static stretching in pt who is athletic

## 2014-08-26 NOTE — Assessment & Plan Note (Signed)
Vague-upper and lower body After exercise and also some stiffness  Nl exam Lab today incl ESR for poss PMR and CK

## 2014-08-26 NOTE — Progress Notes (Signed)
Subjective:    Patient ID: Susan Woods, female    DOB: [birth year], 69 y.o.   MRN: [id number]  HPI Here with muscle pain  - primarily in the back   Xrays of LS were normal  Also flexeril - very helpful   Some low grade muscle irritability and soreness Back and both legs and feet  Calf- both sides  Shoulder girdle  Better when active and worse after inactivity (sometimes stiff)   She works out regularly  Yahoo with epsom salts helps   Getting ready to travel   No statins  No tick bites  No joint swelling (but they do ache)  No rash   Patient Active Problem List   Diagnosis Date Noted  . Myalgia 08/26/2014  . Joint pain 08/26/2014  . Low back pain 05/18/2014  . Acute bronchitis 05/01/2014  . Body aches 05/01/2014  . Hypertensive retinopathy of both eyes, grade 1 12/16/2013  . Bilateral foot pain 01/30/2013  . Encounter for Medicare annual wellness exam 12/31/2012  . Vaginal pain 06/07/2012  . Urethritis 06/07/2012  . Routine general medical examination at a health care facility 02/20/2011  . PRURITUS 04/18/2010  . MENINGIOMA 11/17/2009  . TIA 11/17/2009  . Osteoporosis 03/19/2007  . HYPOGLYCEMIA 06/12/2006  . ANEMIA 06/12/2006  . HYPERTENSION 06/12/2006  . ALLERGIC RHINITIS 06/12/2006  . GERD 06/12/2006   Past Medical History  Diagnosis Date  . Nonallergic rhinitis   . GERD (gastroesophageal reflux disease)   . HTN (hypertension)     echo (8/11): EF 55%, mild MR, mild TR  . Osteoporosis   . Anemia   . Meningioma     small; advised no f/u unless symptomatic by Dr. Loletta Specter 8/11  . Tingling     in hands of ? etiol  . Nasal pruritis     full body w/o rash   . Psoriasis     mild intermittent    Past Surgical History  Procedure Laterality Date  . Cesarean section    . Vesicovaginal fistula closure w/ tah    . Oophorectomy    . Nasal sinus surgery  2002  . Abdominal surgery      adhesions  . Colonoscopy  2006  . Dexa - osteopenia   2005    dexa 2009 - osteoporosis/fairly stable   . Orif finger / thumb fracture  12/06  . Urethral stricutre      dilated  . Hosp tia  8/11  . Small meningioma    . 2d echo  8/11    nml   . Abdominal hysterectomy     History  Substance Use Topics  . Smoking status: Former Smoker    Quit date: 04/08/1987  . Smokeless tobacco: Never Used  . Alcohol Use: Yes     Comment: rarely   Family History  Problem Relation Age of Onset  . Cancer Father     throat CA  . Diabetes Father   . Heart disease Mother     MI late 98s - CHF   . Hypertension Mother   . Stroke Brother   . Diabetes Brother   . Heart disease Sister     CHF  . Diabetes Sister   . Hypertension Brother   . Diabetes Brother    Allergies  Allergen Reactions  . Indomethacin     REACTION: swelling, rash  . Levofloxacin Other (See Comments)    Sees spots  . Penicillins     REACTION: urticaria (  hives)  . Sulfonamide Derivatives     REACTION: tingling around mouth and hands felt tight  . Zithromax [Azithromycin]    Current Outpatient Prescriptions on File Prior to Visit  Medication Sig Dispense Refill  . amLODipine (NORVASC) 10 MG tablet Take 1 tablet by mouth  daily 90 tablet 1  . Calcium Carb-Cholecalciferol (CALCIUM 600 + D PO) Take 1 tablet by mouth daily.     . Cholecalciferol (VITAMIN D3) 1000 UNITS CAPS Take 1 capsule by mouth daily.      . cyclobenzaprine (FLEXERIL) 10 MG tablet Take 0.5-1 tablets (5-10 mg total) by mouth 3 (three) times daily as needed for muscle spasms (watch for sedation). 30 tablet 0  . Ferrous Sulfate (FE-CAPS) 250 MG CPCR Take 1 capsule by mouth daily.      . fish oil-omega-3 fatty acids 1000 MG capsule Take 2 g by mouth 2 (two) times daily. Alternate with flax oil    . Flaxseed, Linseed, (FLAX SEED OIL PO) Take by mouth as directed. Alternate with fish oil     . fluticasone (FLONASE) 50 MCG/ACT nasal spray Place 2 sprays into the nose daily as needed for allergies.     Marland Kitchen  levocetirizine (XYZAL) 5 MG tablet TAKE 1 TABLET (5 MG TOTAL) BY MOUTH DAILY AS NEEDED. 90 tablet 0  . metoprolol (LOPRESSOR) 100 MG tablet Take one-half tablet by  mouth twice a day 90 tablet 1  . Misc Natural Products (ECHINACEA COMPOUND) LIQD 10 drops daily in the morning during fall/winter    . mometasone (ELOCON) 0.1 % ointment Apply topically daily. To affected areas as needed 45 g 3  . Multiple Vitamin (MULTIVITAMIN) tablet Take 1 tablet by mouth daily.      . NON FORMULARY similisan dry eye drops and ear drops. UAD PRN      No current facility-administered medications on file prior to visit.    Review of Systems Review of Systems  Constitutional: Negative for fever, appetite change, fatigue and unexpected weight change.  Eyes: Negative for pain and visual disturbance.  Respiratory: Negative for cough and shortness of breath.   Cardiovascular: Negative for cp or palpitations    Gastrointestinal: Negative for nausea, diarrhea and constipation.  Genitourinary: Negative for urgency and frequency.  Skin: Negative for pallor or rash   MSK pos for achiness of joints and muscles without weakness , neg for joint redness or swelling  Neurological: Negative for weakness, light-headedness, numbness and headaches.  Hematological: Negative for adenopathy. Does not bruise/bleed easily.  Psychiatric/Behavioral: Negative for dysphoric mood. The patient is not nervous/anxious.         Objective:   Physical Exam  Constitutional: She appears well-developed and well-nourished. No distress.  HENT:  Head: Normocephalic and atraumatic.  Mouth/Throat: Oropharynx is clear and moist.  Eyes: Conjunctivae and EOM are normal. Pupils are equal, round, and reactive to light. No scleral icterus.  Neck: Normal range of motion. Neck supple.  Cardiovascular: Normal rate and regular rhythm.   Pulmonary/Chest: Effort normal and breath sounds normal.  Musculoskeletal: Normal range of motion. She exhibits  tenderness. She exhibits no edema.  Some tenderness over trapezius/neck musculature and quad muscles  Nl rom all joints No crepitus     Lymphadenopathy:    She has no cervical adenopathy.  Neurological: She is alert. She has normal strength. She displays no atrophy. No sensory deficit. She exhibits normal muscle tone. Coordination and gait normal.  Skin: Skin is warm and dry. No rash noted. No erythema.  No pallor.  Psychiatric: She has a normal mood and affect.          Assessment & Plan:   Problem List Items Addressed This Visit    Joint pain    achey- after exercise or inactivity without swelling/redness/rash or hx of tick bite Lab today-for autoimmune dz and pmr  Disc dynamic and static stretching in pt who is athletic       Relevant Orders   CBC with Differential/Platelet (Completed)   Sedimentation Rate (Completed)   ANA (Completed)   Rheumatoid factor (Completed)   Myalgia - Primary    Vague-upper and lower body After exercise and also some stiffness  Nl exam Lab today incl ESR for poss PMR and CK        Relevant Orders   Sedimentation Rate (Completed)   CK (Completed)

## 2014-08-27 LAB — RHEUMATOID FACTOR: Rhuematoid fact SerPl-aCnc: <10 IU/mL (ref <=14)

## 2014-08-27 LAB — ANA: ANA: NEGATIVE

## 2014-08-28 ENCOUNTER — Telehealth: Payer: Self-pay | Admitting: Family Medicine

## 2014-08-28 NOTE — Telephone Encounter (Signed)
Addressed through result notes  

## 2014-08-28 NOTE — Telephone Encounter (Signed)
Patient called and asked to be called back about her lab results.  Please call patient at [phone].

## 2014-08-28 NOTE — Telephone Encounter (Signed)
Pt returning your call, likely for labs she stated.  Please call back on cell number, thanks

## 2014-10-19 ENCOUNTER — Encounter: Payer: Federal, State, Local not specified - PPO | Admitting: Family Medicine

## 2014-12-09 LAB — HM DIABETES EYE EXAM

## 2014-12-10 DIAGNOSIS — H524 Presbyopia: Secondary | ICD-10-CM | POA: Diagnosis not present

## 2014-12-10 DIAGNOSIS — H2513 Age-related nuclear cataract, bilateral: Secondary | ICD-10-CM | POA: Diagnosis not present

## 2014-12-10 DIAGNOSIS — H33192 Other retinoschisis and retinal cysts, left eye: Secondary | ICD-10-CM | POA: Diagnosis not present

## 2014-12-10 DIAGNOSIS — H35033 Hypertensive retinopathy, bilateral: Secondary | ICD-10-CM | POA: Diagnosis not present

## 2014-12-22 ENCOUNTER — Ambulatory Visit (INDEPENDENT_AMBULATORY_CARE_PROVIDER_SITE_OTHER): Payer: Medicare Other | Admitting: Family Medicine

## 2014-12-22 ENCOUNTER — Encounter: Payer: Self-pay | Admitting: Family Medicine

## 2014-12-22 VITALS — BP 126/71 | HR 68 | Temp 98.0°F | Ht 65.0 in | Wt 152.2 lb

## 2014-12-22 DIAGNOSIS — I10 Essential (primary) hypertension: Secondary | ICD-10-CM | POA: Diagnosis not present

## 2014-12-22 DIAGNOSIS — Z23 Encounter for immunization: Secondary | ICD-10-CM | POA: Diagnosis not present

## 2014-12-22 DIAGNOSIS — Z1211 Encounter for screening for malignant neoplasm of colon: Secondary | ICD-10-CM

## 2014-12-22 DIAGNOSIS — Z0000 Encounter for general adult medical examination without abnormal findings: Secondary | ICD-10-CM

## 2014-12-22 DIAGNOSIS — M810 Age-related osteoporosis without current pathological fracture: Secondary | ICD-10-CM | POA: Diagnosis not present

## 2014-12-22 LAB — COMPREHENSIVE METABOLIC PANEL
ALT: 12 U/L (ref 0–35)
AST: 22 U/L (ref 0–37)
Albumin: 4.3 g/dL (ref 3.5–5.2)
Alkaline Phosphatase: 64 U/L (ref 39–117)
BUN: 9 mg/dL (ref 6–23)
CALCIUM: 9.5 mg/dL (ref 8.4–10.5)
CHLORIDE: 104 meq/L (ref 96–112)
CO2: 27 mEq/L (ref 19–32)
Creatinine, Ser: 0.64 mg/dL (ref 0.40–1.20)
GFR: 118.96 mL/min (ref >60.00)
Glucose, Bld: 86 mg/dL (ref 70–99)
POTASSIUM: 4 meq/L (ref 3.5–5.1)
Sodium: 140 mEq/L (ref 135–145)
Total Bilirubin: 0.7 mg/dL (ref 0.2–1.2)
Total Protein: 7.7 g/dL (ref 6.0–8.3)

## 2014-12-22 LAB — LIPID PANEL
CHOLESTEROL: 149 mg/dL (ref 0–200)
HDL: 56.8 mg/dL (ref >39.00)
LDL CALC: 81 mg/dL (ref 0–99)
NonHDL: 92.65
TRIGLYCERIDES: 58 mg/dL (ref 0.0–149.0)
Total CHOL/HDL Ratio: 3
VLDL: 11.6 mg/dL (ref 0.0–40.0)

## 2014-12-22 LAB — CBC WITH DIFFERENTIAL/PLATELET
BASOS PCT: 0.4 % (ref 0.0–3.0)
Basophils Absolute: 0 10*3/uL (ref 0.0–0.1)
Eosinophils Absolute: 0.1 10*3/uL (ref 0.0–0.7)
Eosinophils Relative: 2.5 % (ref 0.0–5.0)
HEMATOCRIT: 38.3 % (ref 36.0–46.0)
Hemoglobin: 12.7 g/dL (ref 12.0–15.0)
LYMPHS ABS: 2.4 10*3/uL (ref 0.7–4.0)
LYMPHS PCT: 46.1 % — AB (ref 12.0–46.0)
MCHC: 33.1 g/dL (ref 30.0–36.0)
MCV: 89 fl (ref 78.0–100.0)
MONOS PCT: 6.7 % (ref 3.0–12.0)
Monocytes Absolute: 0.3 10*3/uL (ref 0.1–1.0)
Neutro Abs: 2.3 10*3/uL (ref 1.4–7.7)
Neutrophils Relative %: 44.3 % (ref 43.0–77.0)
Platelets: 224 10*3/uL (ref 150.0–400.0)
RBC: 4.3 Mil/uL (ref 3.87–5.11)
RDW: 15.1 % (ref 11.5–15.5)
WBC: 5.2 10*3/uL (ref 4.0–10.5)

## 2014-12-22 LAB — TSH: TSH: 0.48 u[IU]/mL (ref 0.35–4.50)

## 2014-12-22 MED ORDER — METOPROLOL SUCCINATE ER 100 MG PO TB24: 100.0000 mg | ORAL_TABLET | Freq: Every day | ORAL | Status: DC

## 2014-12-22 NOTE — Progress Notes (Signed)
Subjective:    Patient ID: Susan Woods, female    DOB: Aug 11, 1945, 69 y.o.   MRN: [id number]  HPI Here for annual medicare wellness visit as well as chronic/acute medical problems and also annual preventative exam  I have personally reviewed the Medicare Annual Wellness questionnaire and have noted 1. The patient's medical and social history 2. Their use of alcohol, tobacco or illicit drugs 3. Their current medications and supplements 4. The patient's functional ability including ADL's, fall risks, home safety risks and hearing or visual             impairment. 5. Diet and physical activities 6. Evidence for depression or mood disorders  The patients weight, height, BMI have been recorded in the chart and visual acuity is per eye clinic.  I have made referrals, counseling and provided education to the patient based review of the above and I have provided the pt with a written personalized care plan for preventive services. Reviewed and updated provider list, see scanned forms  Doing well and takes good care of herself   See scanned forms.  Routine anticipatory guidance given to patient.  See health maintenance. Colon cancer screening 1/06 normal - will schedule her 10 year recall  Breast cancer screening mammogram 10/15 - and she scheduled for 10/24 Self breast exam no lumps or changes  Flu vaccine-will get today Tetanus vaccine 4/08 Pneumovax -due for prevnar  Zoster vaccine 11/13  dexa 11/14 with OP - and she was on fosamax (she decided to stop it due to risk of jaw tumor-her dentist scared her)  Declines further dexa since she would never consider medication for fx prevention  Exercises regularly  Takes her ca and D3  Advance directive-has a living will and power of attorney  Cognitive function addressed- see scanned forms- and if abnormal then additional documentation follows.  No significant memory issues   PMH and SH reviewed  Meds, vitals, and allergies  reviewed.   ROS: See HPI.  Otherwise negative.    bp is up because she did not take med fasting for visit today  No cp or palpitations or headaches or edema  No side effects to medicines  BP Readings from Last 3 Encounters:  12/22/14 150/92  08/26/14 140/86  05/18/14 148/90    She checks her bp at home 126/71 - with an accurate cuff  whitecoat component   due for labs   Eats well  Watches sugar and fats  meds have not changed    Patient Active Problem List   Diagnosis Date Noted  . Myalgia 08/26/2014  . Joint pain 08/26/2014  . Low back pain 05/18/2014  . Acute bronchitis 05/01/2014  . Body aches 05/01/2014  . Hypertensive retinopathy of both eyes, grade 1 12/16/2013  . Bilateral foot pain 01/30/2013  . Encounter for Medicare annual wellness exam 12/31/2012  . Vaginal pain 06/07/2012  . Urethritis 06/07/2012  . Routine general medical examination at a health care facility 02/20/2011  . PRURITUS 04/18/2010  . MENINGIOMA 11/17/2009  . TIA 11/17/2009  . Osteoporosis 03/19/2007  . HYPOGLYCEMIA 06/12/2006  . ANEMIA 06/12/2006  . Essential hypertension 06/12/2006  . ALLERGIC RHINITIS 06/12/2006  . GERD 06/12/2006   Past Medical History  Diagnosis Date  . Nonallergic rhinitis   . GERD (gastroesophageal reflux disease)   . HTN (hypertension)     echo (8/11): EF 55%, mild MR, mild TR  . Osteoporosis   . Anemia   . Meningioma ([place])  small; advised no f/u unless symptomatic by Dr. Loletta Specter 8/11  . Tingling     in hands of ? etiol  . Nasal pruritis     full body w/o rash   . Psoriasis     mild intermittent    Past Surgical History  Procedure Laterality Date  . Cesarean section    . Vesicovaginal fistula closure w/ tah    . Oophorectomy    . Nasal sinus surgery  2002  . Abdominal surgery      adhesions  . Colonoscopy  2006  . Dexa - osteopenia  2005    dexa 2009 - osteoporosis/fairly stable   . Orif finger / thumb fracture  12/06  . Urethral stricutre       dilated  . Hosp tia  8/11  . Small meningioma    . 2d echo  8/11    nml   . Abdominal hysterectomy     Social History  Substance Use Topics  . Smoking status: Former Smoker    Quit date: 04/08/1987  . Smokeless tobacco: Never Used  . Alcohol Use: 0.0 oz/week    0 Standard drinks or equivalent per week     Comment: rarely   Family History  Problem Relation Age of Onset  . Cancer Father     throat CA  . Diabetes Father   . Heart disease Mother     MI late 31s - CHF   . Hypertension Mother   . Stroke Brother   . Diabetes Brother   . Heart disease Sister     CHF  . Diabetes Sister   . Hypertension Brother   . Diabetes Brother    Allergies  Allergen Reactions  . Indomethacin     REACTION: swelling, rash  . Levofloxacin Other (See Comments)    Sees spots  . Penicillins     REACTION: urticaria (hives)  . Sulfonamide Derivatives     REACTION: tingling around mouth and hands felt tight  . Zithromax [Azithromycin]    Current Outpatient Prescriptions on File Prior to Visit  Medication Sig Dispense Refill  . amLODipine (NORVASC) 10 MG tablet Take 1 tablet by mouth  daily 90 tablet 1  . Calcium Carb-Cholecalciferol (CALCIUM 600 + D PO) Take 1 tablet by mouth daily.     . Cholecalciferol (VITAMIN D3) 1000 UNITS CAPS Take 1 capsule by mouth daily.      . cyclobenzaprine (FLEXERIL) 10 MG tablet Take 0.5-1 tablets (5-10 mg total) by mouth 3 (three) times daily as needed for muscle spasms (watch for sedation). 30 tablet 0  . Ferrous Sulfate (FE-CAPS) 250 MG CPCR Take 1 capsule by mouth daily.      . fish oil-omega-3 fatty acids 1000 MG capsule Take 2 g by mouth 2 (two) times daily. Alternate with flax oil    . levocetirizine (XYZAL) 5 MG tablet TAKE 1 TABLET (5 MG TOTAL) BY MOUTH DAILY AS NEEDED. 90 tablet 0  . metoprolol (LOPRESSOR) 100 MG tablet Take one-half tablet by  mouth twice a day 90 tablet 1  . mometasone (ELOCON) 0.1 % ointment Apply topically daily. To affected areas  as needed 45 g 3  . Multiple Vitamin (MULTIVITAMIN) tablet Take 1 tablet by mouth daily.      . NON FORMULARY similisan dry eye drops and ear drops. UAD PRN      No current facility-administered medications on file prior to visit.    Review of Systems Review of Systems  Constitutional:  Negative for fever, appetite change, fatigue and unexpected weight change.  Eyes: Negative for pain and visual disturbance.  Respiratory: Negative for cough and shortness of breath.   Cardiovascular: Negative for cp or palpitations    Gastrointestinal: Negative for nausea, diarrhea and constipation.  Genitourinary: Negative for urgency and frequency.  Skin: Negative for pallor or rash   Neurological: Negative for weakness, light-headedness, numbness and headaches.  Hematological: Negative for adenopathy. Does not bruise/bleed easily.  Psychiatric/Behavioral: Negative for dysphoric mood. The patient is not nervous/anxious.         Objective:   Physical Exam  Constitutional: She appears well-developed and well-nourished. No distress.  Well appearing   HENT:  Head: Normocephalic and atraumatic.  Right Ear: External ear normal.  Left Ear: External ear normal.  Mouth/Throat: Oropharynx is clear and moist.  Eyes: Conjunctivae and EOM are normal. Pupils are equal, round, and reactive to light. No scleral icterus.  Neck: Normal range of motion. Neck supple. No JVD present. Carotid bruit is not present. No thyromegaly present.  Cardiovascular: Normal rate, regular rhythm, normal heart sounds and intact distal pulses.  Exam reveals no gallop.   Pulmonary/Chest: Effort normal and breath sounds normal. No respiratory distress. She has no wheezes. She exhibits no tenderness.  Abdominal: Soft. Bowel sounds are normal. She exhibits no distension, no abdominal bruit and no mass. There is no tenderness.  Genitourinary: No breast swelling, tenderness, discharge or bleeding.  Breast exam: No mass, nodules, thickening,  tenderness, bulging, retraction, inflamation, nipple discharge or skin changes noted.  No axillary or clavicular LA.      Musculoskeletal: Normal range of motion. She exhibits no edema or tenderness.  Lymphadenopathy:    She has no cervical adenopathy.  Neurological: She is alert. She has normal reflexes. No cranial nerve deficit. She exhibits normal muscle tone. Coordination normal.  Skin: Skin is warm and dry. No rash noted. No erythema. No pallor.  Psychiatric: She has a normal mood and affect.          Assessment & Plan:   Problem List Items Addressed This Visit      Cardiovascular and Mediastinum   Essential hypertension    bp in fair control at this time  BP Readings from Last 1 Encounters:  12/22/14 126/71   No changes needed Disc lifstyle change with low sodium diet and exercise  Labs today      Relevant Medications   metoprolol succinate (TOPROL-XL) 100 MG 24 hr tablet   Other Relevant Orders   CBC with Differential/Platelet (Completed)   Comprehensive metabolic panel (Completed)   TSH (Completed)   Lipid panel (Completed)     Musculoskeletal and Integument   Osteoporosis    Pt decided not to take fosamax any longer due to what her dentist told her  She declines any further testing or treatment Has had no falls or fractures   Disc need for calcium/ vitamin D/ wt bearing exercise and bone density test every 2 y to monitor Disc safety/ fracture risk in detail           Other   Colon cancer screening    Schedule 10 year recall for colonoscopy       Relevant Orders   Ambulatory referral to Gastroenterology   Encounter for Medicare annual wellness exam - Primary    Reviewed health habits including diet and exercise and skin cancer prevention Reviewed appropriate screening tests for age  Also reviewed health mt list, fam hx and immunization status ,  as well as social and family history   See HPI Labs today Flu shot and prevnar vaccine today  Labs today   Stop at check out for ref for colonoscopy Make sure to get your mammogram as planned       Routine general medical examination at a health care facility    Reviewed health habits including diet and exercise and skin cancer prevention Reviewed appropriate screening tests for age  Also reviewed health mt list, fam hx and immunization status , as well as social and family history   See HPI Labs today Flu shot and prevnar vaccine today  Labs today  Stop at check out for ref for colonoscopy Make sure to get your mammogram as planned  Will change metoprolol to the ER  - printed for you        Other Visit Diagnoses    Need for influenza vaccination        Relevant Orders    Flu Vaccine QUAD 36+ mos PF IM (Fluarix & Fluzone Quad PF) (Completed)    Need for vaccination with 13-polyvalent pneumococcal conjugate vaccine        Relevant Orders    Pneumococcal conjugate vaccine 13-valent (Completed)

## 2014-12-22 NOTE — Patient Instructions (Addendum)
Flu shot and prevnar vaccine today  Labs today  Stop at check out for ref for colonoscopy Make sure to get your mammogram as planned  Will change metoprolol to the ER  - printed for you

## 2014-12-22 NOTE — Progress Notes (Signed)
Pre visit review using our clinic review tool, if applicable. No additional management support is needed unless otherwise documented below in the visit note. 

## 2014-12-23 ENCOUNTER — Encounter: Payer: Self-pay | Admitting: *Deleted

## 2014-12-23 NOTE — Assessment & Plan Note (Signed)
bp in fair control at this time  BP Readings from Last 1 Encounters:  12/22/14 126/71   No changes needed Disc lifstyle change with low sodium diet and exercise  Labs today

## 2014-12-23 NOTE — Assessment & Plan Note (Signed)
Reviewed health habits including diet and exercise and skin cancer prevention Reviewed appropriate screening tests for age  Also reviewed health mt list, fam hx and immunization status , as well as social and family history   See HPI Labs today Flu shot and prevnar vaccine today  Labs today  Stop at check out for ref for colonoscopy Make sure to get your mammogram as planned  Will change metoprolol to the ER  - printed for you

## 2014-12-23 NOTE — Assessment & Plan Note (Signed)
Pt decided not to take fosamax any longer due to what her dentist told her  She declines any further testing or treatment Has had no falls or fractures   Disc need for calcium/ vitamin D/ wt bearing exercise and bone density test every 2 y to monitor Disc safety/ fracture risk in detail

## 2014-12-23 NOTE — Assessment & Plan Note (Signed)
Reviewed health habits including diet and exercise and skin cancer prevention Reviewed appropriate screening tests for age  Also reviewed health mt list, fam hx and immunization status , as well as social and family history   See HPI Labs today Flu shot and prevnar vaccine today  Labs today  Stop at check out for ref for colonoscopy Make sure to get your mammogram as planned

## 2014-12-23 NOTE — Assessment & Plan Note (Signed)
Schedule 10 year recall for colonoscopy

## 2014-12-28 ENCOUNTER — Encounter: Payer: Self-pay | Admitting: Family Medicine

## 2014-12-28 DIAGNOSIS — I999 Unspecified disorder of circulatory system: Secondary | ICD-10-CM | POA: Insufficient documentation

## 2015-01-04 DIAGNOSIS — Z1231 Encounter for screening mammogram for malignant neoplasm of breast: Secondary | ICD-10-CM | POA: Diagnosis not present

## 2015-01-04 LAB — HM MAMMOGRAPHY: HM Mammogram: NORMAL

## 2015-01-05 ENCOUNTER — Encounter: Payer: Self-pay | Admitting: Family Medicine

## 2015-01-06 ENCOUNTER — Encounter: Payer: Self-pay | Admitting: *Deleted

## 2015-01-06 ENCOUNTER — Encounter: Payer: Self-pay | Admitting: Family Medicine

## 2015-01-15 ENCOUNTER — Other Ambulatory Visit: Payer: Self-pay | Admitting: Family Medicine

## 2015-01-26 ENCOUNTER — Telehealth: Payer: Self-pay | Admitting: Family Medicine

## 2015-01-26 NOTE — Telephone Encounter (Signed)
Pt has appt 01/27/15 at 3:15 with Dr Glori Bickers.

## 2015-01-26 NOTE — Telephone Encounter (Signed)
[facility] Patient Name: Susan Woods DOB: 23-Jul-1945 Initial Comment Caller States she is having pressure behind her eyes, sniffles, cough, sneezing. had her flu and phenomena shot on 12/22/14 Nurse Assessment Nurse: Mechele Dawley, RN, Amy Date/Time Eilene Ghazi Time): 01/26/2015 9:40:25 AM Confirm and document reason for call. If symptomatic, describe symptoms. ---CALLER STATES THAT SHE IS HAVING SOME PRESSURE BEHIND HER EYES. SHE IS HAVING SOME COLD SYMPTOMS, SNEEZING, NASAL CONGESTION. SHE HAS JUST SEEN HER EYE DOCTOR AND NO COMPLICATIONS THERE. SHE HAS THE FLU AND PNEUMONIA VACCINATIONS. Has the patient traveled out of the country within the last 30 days? ---Not Applicable Does the patient have any new or worsening symptoms? ---Yes Will a triage be completed? ---Yes Related visit to physician within the last 2 weeks? ---No Does the PT have any chronic conditions? (i.e. diabetes, asthma, etc.) ---Yes List chronic conditions. ---hypertension Guidelines Guideline Title Affirmed Question Affirmed Notes Common Cold Earache Final Disposition User See Physician within [place], Therapist, sports, Amy Referrals REFERRED TO PCP OFFICE Disagree/Comply: Comply

## 2015-01-26 NOTE — Telephone Encounter (Signed)
I will see her then  

## 2015-01-27 ENCOUNTER — Ambulatory Visit: Payer: Self-pay | Admitting: Family Medicine

## 2015-02-08 ENCOUNTER — Ambulatory Visit (INDEPENDENT_AMBULATORY_CARE_PROVIDER_SITE_OTHER): Payer: Medicare Other | Admitting: Family Medicine

## 2015-02-08 ENCOUNTER — Telehealth: Payer: Self-pay

## 2015-02-08 ENCOUNTER — Encounter: Payer: Self-pay | Admitting: Family Medicine

## 2015-02-08 VITALS — BP 140/90 | HR 66 | Temp 98.2°F | Ht 65.0 in | Wt 154.5 lb

## 2015-02-08 DIAGNOSIS — I10 Essential (primary) hypertension: Secondary | ICD-10-CM

## 2015-02-08 DIAGNOSIS — J019 Acute sinusitis, unspecified: Secondary | ICD-10-CM | POA: Insufficient documentation

## 2015-02-08 DIAGNOSIS — J0100 Acute maxillary sinusitis, unspecified: Secondary | ICD-10-CM

## 2015-02-08 MED ORDER — LEVOFLOXACIN 500 MG PO TABS: 500.0000 mg | ORAL_TABLET | Freq: Every day | ORAL | Status: DC

## 2015-02-08 MED ORDER — DOXYCYCLINE HYCLATE 100 MG PO CAPS: 100.0000 mg | ORAL_CAPSULE | Freq: Two times a day (BID) | ORAL | Status: DC

## 2015-02-08 NOTE — Telephone Encounter (Signed)
Patient advised.

## 2015-02-08 NOTE — Progress Notes (Signed)
Subjective:    Patient ID: Susan Woods, female    DOB: April 25, 1945, 69 y.o.   MRN: [id number]  HPI  Here for uri symptoms   Started in late oct - full head/ congestion and eyes hurt Took chlorcedin and it helped for a while  Also saw her eye doctor   Watery eyes that feel pressure behind them  Had chills on and off (has not checked bp)  Colored nasal d/c  A little cough -usually dry/ a little mucous this am  Post nasal drainage - makes her cough also      Also noted: bp is up and down  Her mail order co made mistake with her beta blocker- waiting for toprol xl (now on the tartarate) Still waiting on it (takes 1/2 bid)    BP Readings from Last 3 Encounters:  02/08/15 142/96  12/22/14 126/71  08/26/14 140/86     Patient Active Problem List   Diagnosis Date Noted  . Acute sinusitis 02/08/2015  . 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 Colon cancer screening 12/22/2014  . Myalgia 08/26/2014  . Joint pain 08/26/2014  . Low back pain 05/18/2014  . Acute bronchitis 05/01/2014  . Body aches 05/01/2014  . Hypertensive retinopathy of both eyes, grade 1 12/16/2013  . Bilateral foot pain 01/30/2013  . Encounter for Medicare annual wellness exam 12/31/2012  . Vaginal pain 06/07/2012  . Urethritis 06/07/2012  . Routine general medical examination at a health care facility 02/20/2011  . PRURITUS 04/18/2010  . MENINGIOMA 11/17/2009  . TIA 11/17/2009  . Osteoporosis 03/19/2007  . HYPOGLYCEMIA 06/12/2006  . ANEMIA 06/12/2006  . Essential hypertension 06/12/2006  . ALLERGIC RHINITIS 06/12/2006  . GERD 06/12/2006   Past Medical History  Diagnosis Date  . Nonallergic rhinitis   . GERD (gastroesophageal reflux disease)   . HTN (hypertension)     echo (8/11): EF 55%, mild MR, mild TR  . Osteoporosis   . Anemia   . Meningioma ([place])     small; advised no f/u unless symptomatic by Dr. Loletta Specter 8/11  . Tingling     in hands of ? etiol  . Nasal pruritis     full body w/o  rash   . Psoriasis     mild intermittent    Past Surgical History  Procedure Laterality Date  . Cesarean section    . Vesicovaginal fistula closure w/ tah    . Oophorectomy    . Nasal sinus surgery  2002  . Abdominal surgery      adhesions  . Colonoscopy  2006  . Dexa - osteopenia  2005    dexa 2009 - osteoporosis/fairly stable   . Orif finger / thumb fracture  12/06  . Urethral stricutre      dilated  . Hosp tia  8/11  . Small meningioma    . 2d echo  8/11    nml   . Abdominal hysterectomy     Social History  Substance Use Topics  . Smoking status: Former Smoker    Quit date: 04/08/1987  . Smokeless tobacco: Never Used  . Alcohol Use: 0.0 oz/week    0 Standard drinks or equivalent per week     Comment: rarely   Family History  Problem Relation Age of Onset  . Cancer Father     throat CA  . Diabetes Father   . Heart disease Mother     MI late 73s - CHF   . Hypertension Mother   .  Stroke Brother   . Diabetes Brother   . Heart disease Sister     CHF  . Diabetes Sister   . Hypertension Brother   . Diabetes Brother    Allergies  Allergen Reactions  . Indomethacin     REACTION: swelling, rash  . Levaquin [Levofloxacin In D5w] Other (See Comments)    Muscle pain   . Penicillins     REACTION: urticaria (hives)  . Sulfonamide Derivatives     REACTION: tingling around mouth and hands felt tight  . Zithromax [Azithromycin]    Current Outpatient Prescriptions on File Prior to Visit  Medication Sig Dispense Refill  . amLODipine (NORVASC) 10 MG tablet Take 1 tablet by mouth  daily 90 tablet 3  . Calcium Carb-Cholecalciferol (CALCIUM 600 + D PO) Take 1 tablet by mouth daily.     . Cholecalciferol (VITAMIN D3) 1000 UNITS CAPS Take 1 capsule by mouth daily.      . cyclobenzaprine (FLEXERIL) 10 MG tablet Take 0.5-1 tablets (5-10 mg total) by mouth 3 (three) times daily as needed for muscle spasms (watch for sedation). 30 tablet 0  . Ferrous Sulfate (FE-CAPS) 250 MG  CPCR Take 1 capsule by mouth daily.      . fish oil-omega-3 fatty acids 1000 MG capsule Take 2 g by mouth 2 (two) times daily. Alternate with flax oil    . levocetirizine (XYZAL) 5 MG tablet TAKE 1 TABLET (5 MG TOTAL) BY MOUTH DAILY AS NEEDED. 90 tablet 0  . mometasone (ELOCON) 0.1 % ointment Apply topically daily. To affected areas as needed 45 g 3  . Multiple Vitamin (MULTIVITAMIN) tablet Take 1 tablet by mouth daily.      . NON FORMULARY similisan dry eye drops and ear drops. UAD PRN     . metoprolol succinate (TOPROL-XL) 100 MG 24 hr tablet Take 1 tablet (100 mg total) by mouth daily. Take with or immediately following a meal. (Patient not taking: Reported on 02/08/2015) 90 tablet 3   No current facility-administered medications on file prior to visit.    Review of Systems Review of Systems  Constitutional: Negative for fever, appetite change,  and unexpected weight change. pos for fatigue and malaise  ENT pos for cong/rhinorrhea and sinus pain/pressure Eyes: Negative for pain and visual disturbance.  Respiratory: Negative for wheeze and shortness of breath.   Cardiovascular: Negative for cp or palpitations    Gastrointestinal: Negative for nausea, diarrhea and constipation.  Genitourinary: Negative for urgency and frequency.  Skin: Negative for pallor or rash   Neurological: Negative for weakness, light-headedness, numbness and headaches.  Hematological: Negative for adenopathy. Does not bruise/bleed easily.  Psychiatric/Behavioral: Negative for dysphoric mood. The patient is not nervous/anxious.         Objective:   Physical Exam  Constitutional: She appears well-developed and well-nourished. No distress.  HENT:  Head: Normocephalic and atraumatic.  Right Ear: External ear normal.  Left Ear: External ear normal.  Mouth/Throat: Oropharynx is clear and moist. No oropharyngeal exudate.  Nares are injected and congested  Bilateral maxillary sinus tenderness  Post nasal drip     Eyes: Conjunctivae and EOM are normal. Pupils are equal, round, and reactive to light. Right eye exhibits no discharge. Left eye exhibits no discharge.  Neck: Normal range of motion. Neck supple.  Cardiovascular: Normal rate and regular rhythm.   Pulmonary/Chest: Effort normal and breath sounds normal. No respiratory distress. She has no wheezes. She has no rales.  Musculoskeletal: She exhibits no edema.  Lymphadenopathy:    She has no cervical adenopathy.  Neurological: She is alert. No cranial nerve deficit.  Skin: Skin is warm and dry. No rash noted.  Psychiatric: She has a normal mood and affect.          Assessment & Plan:   Problem List Items Addressed This Visit      Cardiovascular and Mediastinum   Essential hypertension    bp is up today This may be due to malaise/also waiting on her metoprolol xl in the mail - should rec it soon If no imp with change to this from the short acting-will f/u Disc imp of DASH diet and exercise         Respiratory   Acute sinusitis - Primary    S/p uri  Disc symptomatic care - see instructions on AVS  levaquin 500 mg daily for 7 d (pt has multiple med intolerances)  Fluids/rest  Saline /steam    Addendum-pt called back and thinks that levaquin gave her muscle pain in the past  This was changed to doxycycline   (mult med intolerances)  Update if not starting to improve in a week or if worsening

## 2015-02-08 NOTE — Telephone Encounter (Signed)
Pt left v/m; pt was seen earlier today and pt was prescribed levofloxacin; pt thinks she could not take levofloxacin previously because it caused muscle and tendon problems. Pt request different medication and pt request cb.

## 2015-02-08 NOTE — Patient Instructions (Signed)
Drink lots of fluids Get extra rest  Take the levaquin for sinus infection  mucinex can help congestion  Breathe steam / use warm compresses on sinuses / nasal saline spray is helpful   Update if not starting to improve in a week or if worsening

## 2015-02-08 NOTE — Assessment & Plan Note (Signed)
bp is up today This may be due to malaise/also waiting on her metoprolol xl in the mail - should rec it soon If no imp with change to this from the short acting-will f/u Disc imp of DASH diet and exercise

## 2015-02-08 NOTE — Assessment & Plan Note (Signed)
S/p uri  Disc symptomatic care - see instructions on AVS  levaquin 500 mg daily for 7 d (pt has multiple med intolerances)  Fluids/rest  Saline /steam    Addendum-pt called back and thinks that levaquin gave her muscle pain in the past  This was changed to doxycycline   (mult med intolerances)  Update if not starting to improve in a week or if worsening

## 2015-02-08 NOTE — Telephone Encounter (Signed)
Pt left v/m requesting cb today to see if a different med was going to be sent to [facility].Please advise.

## 2015-02-08 NOTE — Telephone Encounter (Signed)
I sent doxycycline in instead of the levaquin

## 2015-02-08 NOTE — Progress Notes (Signed)
Pre visit review using our clinic review tool, if applicable. No additional management support is needed unless otherwise documented below in the visit note. 

## 2015-03-17 ENCOUNTER — Telehealth: Payer: Self-pay

## 2015-03-17 NOTE — Telephone Encounter (Signed)
Pt wanted to know if needed colonoscopy this year;12/2014 visit pt was referred for colonoscopy; note on pts chart pt wanted to wait until 2017. Rosaria Ferries pt care coordinator said pt can call [facility] at [phone] and schedule colonoscopy. Pt voiced understanding.

## 2015-04-26 DIAGNOSIS — Z1211 Encounter for screening for malignant neoplasm of colon: Secondary | ICD-10-CM | POA: Diagnosis not present

## 2015-04-26 DIAGNOSIS — K648 Other hemorrhoids: Secondary | ICD-10-CM | POA: Diagnosis not present

## 2015-05-28 ENCOUNTER — Other Ambulatory Visit: Payer: Self-pay | Admitting: Family Medicine

## 2015-06-04 ENCOUNTER — Other Ambulatory Visit: Payer: Self-pay | Admitting: *Deleted

## 2015-06-04 MED ORDER — FLUTICASONE PROPIONATE 50 MCG/ACT NA SUSP: 2.0000 | Freq: Every day | NASAL | Status: DC

## 2015-06-14 LAB — FECAL OCCULT BLOOD, GUAIAC: FECAL OCCULT BLD: NEGATIVE

## 2015-06-22 ENCOUNTER — Encounter: Payer: Self-pay | Admitting: Family Medicine

## 2015-06-22 ENCOUNTER — Ambulatory Visit (INDEPENDENT_AMBULATORY_CARE_PROVIDER_SITE_OTHER): Payer: Medicare Other | Admitting: Family Medicine

## 2015-06-22 VITALS — BP 128/80 | HR 60 | Temp 97.6°F | Ht 65.0 in | Wt 152.5 lb

## 2015-06-22 DIAGNOSIS — T7840XA Allergy, unspecified, initial encounter: Secondary | ICD-10-CM

## 2015-06-22 DIAGNOSIS — J3489 Other specified disorders of nose and nasal sinuses: Secondary | ICD-10-CM | POA: Diagnosis not present

## 2015-06-22 MED ORDER — DOXYCYCLINE HYCLATE 100 MG PO TABS: 100.0000 mg | ORAL_TABLET | Freq: Two times a day (BID) | ORAL | Status: DC

## 2015-06-22 MED ORDER — PREDNISONE 10 MG PO TABS
ORAL_TABLET | ORAL | Status: DC
Start: 2015-06-22 — End: 2015-08-20

## 2015-06-22 NOTE — Assessment & Plan Note (Signed)
No clear bacterial infection. Steroid taper will help, continue nasal steroid, xyzal and start nasal saline irrigation.

## 2015-06-22 NOTE — Progress Notes (Signed)
Pre visit review using our clinic review tool, if applicable. No additional management support is needed unless otherwise documented below in the visit note. 

## 2015-06-22 NOTE — Patient Instructions (Signed)
Start and complete predisone taper.  Continue levoceterizine and flonase daily.  Nasal saline spray  2-3 times daily.  If not improving in 4-5 days, fill prescription for antibiotics

## 2015-06-22 NOTE — Progress Notes (Signed)
   Subjective:    Patient ID: Susan Woods, female    DOB: 09-12-1945, 70 y.o.   MRN: [id number]  HPI  70 year old female pt of Dr. Glori Bickers presents with new onset  Sinus pressure in last week. Eyes water, facial pressure, nasal congestion.  She has also noted twitch under her nose. Itchy on skin in face.  Has had chills in last few days. Flonase started. Helped nose twitching.  She took a xyzal last night. Feels funny around mouth. Tingly almost.  No fever.  No known new exposures. Was around lots of perfume in last few days. No new medicines. No lip or tounge swelling.   Social History /Family History/Past Medical History reviewed and updated if needed.  Review of Systems  Constitutional: Negative for fever and fatigue.  HENT: Negative for ear pain.   Eyes: Negative for pain.  Respiratory: Negative for chest tightness and shortness of breath.   Cardiovascular: Negative for chest pain, palpitations and leg swelling.  Gastrointestinal: Negative for abdominal pain.  Genitourinary: Negative for dysuria.  Neurological: Positive for light-headedness. Negative for tremors, syncope and weakness.       Objective:   Physical Exam  Constitutional: Vital signs are normal. She appears well-developed and well-nourished. She is cooperative.  Non-toxic appearance. She does not appear ill. No distress.  HENT:  Head: Normocephalic.  Right Ear: Hearing, tympanic membrane, external ear and ear canal normal. Tympanic membrane is not erythematous, not retracted and not bulging. No middle ear effusion.  Left Ear: Hearing, tympanic membrane, external ear and ear canal normal. Tympanic membrane is not erythematous, not retracted and not bulging.  No middle ear effusion.  Nose: Mucosal edema present. No rhinorrhea. Right sinus exhibits maxillary sinus tenderness and frontal sinus tenderness. Left sinus exhibits maxillary sinus tenderness and frontal sinus tenderness.  Mouth/Throat: Uvula  is midline, oropharynx is clear and moist and mucous membranes are normal.  Eyes: Conjunctivae, EOM and lids are normal. Pupils are equal, round, and reactive to light. Lids are everted and swept, no foreign bodies found.  Neck: Trachea normal and normal range of motion. Neck supple. Carotid bruit is not present. No thyroid mass and no thyromegaly present.  Cardiovascular: Normal rate, regular rhythm, S1 normal, S2 normal, normal heart sounds, intact distal pulses and normal pulses.  Exam reveals no gallop and no friction rub.   No murmur heard. Pulmonary/Chest: Effort normal and breath sounds normal. No tachypnea. No respiratory distress. She has no decreased breath sounds. She has no wheezes. She has no rhonchi. She has no rales.  Abdominal: Soft. Normal appearance and bowel sounds are normal. There is no tenderness.  Neurological: She is alert.  Skin: Skin is warm, dry and intact. No rash noted.  Psychiatric: Her speech is normal and behavior is normal. Judgment and thought content normal. Her mood appears not anxious. Cognition and memory are normal. She does not exhibit a depressed mood.          Assessment & Plan:

## 2015-06-22 NOTE — Assessment & Plan Note (Signed)
Treat with prednisone given possible allergy reaction to unknown trigger with symptoms around mouth.

## 2015-06-29 ENCOUNTER — Ambulatory Visit: Payer: Medicare Other | Admitting: Family Medicine

## 2015-07-12 ENCOUNTER — Other Ambulatory Visit: Payer: Self-pay | Admitting: Family Medicine

## 2015-08-20 ENCOUNTER — Encounter: Payer: Self-pay | Admitting: Family Medicine

## 2015-08-20 ENCOUNTER — Telehealth: Payer: Self-pay

## 2015-08-20 ENCOUNTER — Ambulatory Visit (INDEPENDENT_AMBULATORY_CARE_PROVIDER_SITE_OTHER): Payer: Medicare Other | Admitting: Family Medicine

## 2015-08-20 VITALS — BP 126/70 | HR 63 | Temp 97.6°F | Wt 151.0 lb

## 2015-08-20 DIAGNOSIS — J302 Other seasonal allergic rhinitis: Secondary | ICD-10-CM

## 2015-08-20 MED ORDER — AZELASTINE HCL 0.15 % NA SOLN: 2.0000 | Freq: Two times a day (BID) | NASAL | Status: DC

## 2015-08-20 MED ORDER — LEVOCETIRIZINE DIHYDROCHLORIDE 5 MG PO TABS: ORAL_TABLET | ORAL | Status: DC

## 2015-08-20 NOTE — Telephone Encounter (Signed)
Spoke to pt and advised per Dr Glori Bickers. Pt will contact insurance and notify office of response

## 2015-08-20 NOTE — Progress Notes (Signed)
Pre visit review using our clinic review tool, if applicable. No additional management support is needed unless otherwise documented below in the visit note. 

## 2015-08-20 NOTE — Patient Instructions (Signed)
For allergies and vasomotor rhinitis - continue xysal and flonase Add astelin nasal spray 1-2 sprays each nostril up to twice daily   Also allergen avoidance  Take care of yourself  Update [place] if no improved

## 2015-08-20 NOTE — Progress Notes (Signed)
Subjective:    Patient ID: Susan Woods, female    DOB: October 11, 1945, 70 y.o.   MRN: [id number]  HPI  Here for allergy problems   Last visit in April she saw Dr Diona Browner  She had tingling in her nose  Also feels like lip could get swollen but it doesn't  Px prednisone and abx for poss sinusitis also  This did help  Symptoms are not totally gone   On xysal 5 mg - was off of it  Also flonase   Allergy w/u in the past - non allergic rhinitis  Avoids nuts (less itching)  Also bothered by perfume and colonge  Has carpets   Patient Active Problem List   Diagnosis Date Noted  . Sinus pressure 06/22/2015  . Allergic reaction 06/22/2015  . 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 Colon cancer screening 12/22/2014  . Myalgia 08/26/2014  . Joint pain 08/26/2014  . Low back pain 05/18/2014  . Body aches 05/01/2014  . Hypertensive retinopathy of both eyes, grade 1 12/16/2013  . Bilateral foot pain 01/30/2013  . Encounter for Medicare annual wellness exam 12/31/2012  . Vaginal pain 06/07/2012  . Urethritis 06/07/2012  . Routine general medical examination at a health care facility 02/20/2011  . PRURITUS 04/18/2010  . MENINGIOMA 11/17/2009  . TIA 11/17/2009  . Osteoporosis 03/19/2007  . HYPOGLYCEMIA 06/12/2006  . ANEMIA 06/12/2006  . Essential hypertension 06/12/2006  . Allergic rhinitis 06/12/2006  . GERD 06/12/2006   Past Medical History  Diagnosis Date  . Nonallergic rhinitis   . GERD (gastroesophageal reflux disease)   . HTN (hypertension)     echo (8/11): EF 55%, mild MR, mild TR  . Osteoporosis   . Anemia   . Meningioma ([place])     small; advised no f/u unless symptomatic by Dr. Loletta Specter 8/11  . Tingling     in hands of ? etiol  . Nasal pruritis     full body w/o rash   . Psoriasis     mild intermittent    Past Surgical History  Procedure Laterality Date  . Cesarean section    . Vesicovaginal fistula closure w/ tah    . Oophorectomy    . Nasal sinus surgery   2002  . Abdominal surgery      adhesions  . Colonoscopy  2006  . Dexa - osteopenia  2005    dexa 2009 - osteoporosis/fairly stable   . Orif finger / thumb fracture  12/06  . Urethral stricutre      dilated  . Hosp tia  8/11  . Small meningioma    . 2d echo  8/11    nml   . Abdominal hysterectomy     Social History  Substance Use Topics  . Smoking status: Former Smoker    Quit date: 04/08/1987  . Smokeless tobacco: Never Used  . Alcohol Use: 0.0 oz/week    0 Standard drinks or equivalent per week     Comment: rarely   Family History  Problem Relation Age of Onset  . Cancer Father     throat CA  . Diabetes Father   . Heart disease Mother     MI late 33s - CHF   . Hypertension Mother   . Stroke Brother   . Diabetes Brother   . Heart disease Sister     CHF  . Diabetes Sister   . Hypertension Brother   . Diabetes Brother    Allergies  Allergen Reactions  .  Indomethacin     REACTION: swelling, rash  . Levaquin [Levofloxacin In D5w] Other (See Comments)    Muscle pain   . Penicillins     REACTION: urticaria (hives)  . Sulfonamide Derivatives     REACTION: tingling around mouth and hands felt tight  . Zithromax [Azithromycin]    Current Outpatient Prescriptions on File Prior to Visit  Medication Sig Dispense Refill  . alendronate (FOSAMAX) 70 MG tablet Take 1 tablet by mouth  every 7 (seven) days. Take  with a full glass of water  on an empty stomach. 12 tablet 1  . amLODipine (NORVASC) 10 MG tablet Take 1 tablet by mouth  daily 90 tablet 3  . Calcium Carb-Cholecalciferol (CALCIUM 600 + D PO) Take 1 tablet by mouth daily.     . Cholecalciferol (VITAMIN D3) 1000 UNITS CAPS Take 1 capsule by mouth daily.      . cyclobenzaprine (FLEXERIL) 10 MG tablet Take 0.5-1 tablets (5-10 mg total) by mouth 3 (three) times daily as needed for muscle spasms (watch for sedation). 30 tablet 0  . Ferrous Sulfate (FE-CAPS) 250 MG CPCR Take 1 capsule by mouth daily.      . fish  oil-omega-3 fatty acids 1000 MG capsule Take 2 g by mouth 2 (two) times daily. Alternate with flax oil    . fluticasone (FLONASE) 50 MCG/ACT nasal spray Place 2 sprays into both nostrils daily. 48 g 1  . metoprolol succinate (TOPROL-XL) 100 MG 24 hr tablet Take 1 tablet (100 mg total) by mouth daily. Take with or immediately following a meal. 90 tablet 3  . Multiple Vitamin (MULTIVITAMIN) tablet Take 1 tablet by mouth daily.      . NON FORMULARY similisan dry eye drops and ear drops. UAD PRN      No current facility-administered medications on file prior to visit.      Review of Systems Review of Systems  Constitutional: Negative for fever, appetite change, fatigue and unexpected weight change.  ENT pos for cong and rhinorrhea and tingly lips/sneezing, neg for sinus pain  Eyes: Negative for pain and visual disturbance.  Respiratory: Negative for cough and shortness of breath.   Cardiovascular: Negative for cp or palpitations    Gastrointestinal: Negative for nausea, diarrhea and constipation.  Genitourinary: Negative for urgency and frequency.  Skin: Negative for pallor or rash   Neurological: Negative for weakness, light-headedness, numbness and headaches.  Hematological: Negative for adenopathy. Does not bruise/bleed easily.  Psychiatric/Behavioral: Negative for dysphoric mood. The patient is not nervous/anxious.         Objective:   Physical Exam  Constitutional: She appears well-developed and well-nourished. No distress.  HENT:  Head: Normocephalic and atraumatic.  Right Ear: External ear normal.  Left Ear: External ear normal.  Mouth/Throat: Oropharynx is clear and moist.  Nares are boggy and swollen and pale Clear rhinorrhea and pnd  No sinus tenderness   Eyes: Conjunctivae and EOM are normal. Pupils are equal, round, and reactive to light. Right eye exhibits no discharge. Left eye exhibits no discharge.  Neck: Normal range of motion. Neck supple. No JVD present.    Cardiovascular: Normal rate and regular rhythm.   Pulmonary/Chest: Effort normal and breath sounds normal. No respiratory distress. She has no wheezes. She has no rales.  Musculoskeletal: She exhibits no edema.  Lymphadenopathy:    She has no cervical adenopathy.  Neurological: She is alert.  Skin: Skin is warm and dry. No rash noted. No erythema.  Psychiatric: She  has a normal mood and affect.          Assessment & Plan:   Problem List Items Addressed This Visit      Respiratory   Allergic rhinitis - Primary    Worse lately Will get her refilled on xysal which she is out of  Continue flonase  Avoid allergens when able  Add astelin ns bid prn to help further Update if not starting to improve in a week or if worsening

## 2015-08-20 NOTE — Telephone Encounter (Signed)
Thanks for the heads up - if she wants to see if her insurance covers other allergy meds- have her call and let me know Usually that med is generic and fairly cheap but some ins co do what they want to

## 2015-08-20 NOTE — Telephone Encounter (Signed)
Pt was seen earlier today; azelastine nasal spray is too expensive and pt is not going to pick up nasal spray; pt will continue the flonase and levocetirizine; pt will cb if needed. FYI to Dr Glori Bickers.

## 2015-08-22 NOTE — Assessment & Plan Note (Signed)
Worse lately Will get her refilled on xysal which she is out of  Continue flonase  Avoid allergens when able  Add astelin ns bid prn to help further Update if not starting to improve in a week or if worsening

## 2015-08-30 ENCOUNTER — Other Ambulatory Visit: Payer: Self-pay | Admitting: Family Medicine

## 2015-10-06 ENCOUNTER — Telehealth: Payer: Self-pay

## 2015-10-06 NOTE — Telephone Encounter (Signed)
Please remove from med list-thanks

## 2015-10-06 NOTE — Telephone Encounter (Signed)
Pt left v/m; pt received alendronate from mail order pharmacy and pt is no longer taking the alendronate. Pt request removed from med list. Per annual exam on 12/22/14 noted pt said she stopped fosamax due to what the dentist told pt. Do you want removed from med list.Please advise.

## 2015-10-07 NOTE — Telephone Encounter (Signed)
Med removed from med list.  

## 2015-11-05 ENCOUNTER — Other Ambulatory Visit: Payer: Self-pay | Admitting: Family Medicine

## 2015-11-09 ENCOUNTER — Other Ambulatory Visit: Payer: Self-pay | Admitting: *Deleted

## 2015-11-09 MED ORDER — METOPROLOL SUCCINATE ER 100 MG PO TB24: ORAL_TABLET | ORAL | 1 refills | Status: DC

## 2015-11-26 ENCOUNTER — Telehealth: Payer: Self-pay | Admitting: Family Medicine

## 2015-11-26 NOTE — Telephone Encounter (Signed)
She is due for a flu shot now unless she has already had one somewhere else-she can get that here  Last tetanus shot here was Td -(not Tdap) - so she will need a Tdap - medicare does not usually pay for tetanus shots either way so I advise her to call her ins to see if it is covered at a pharmacy (and if not check the price at the health dept)- since it would be more expensive and probably out of pocket here

## 2015-11-26 NOTE — Telephone Encounter (Signed)
Pt called because she is doing volunteer work at [facility] and needs proof of her flu shot and TDAP.  Can you please call her to see if she has had this or if she needs to come in to get them.

## 2015-11-29 NOTE — Telephone Encounter (Signed)
Pt is scheduled to get flu vaccine at [facility] and she will check with insurance to see about coverage on Tdap vaccine at pharmacy or health dpt

## 2015-12-13 DIAGNOSIS — I10 Essential (primary) hypertension: Secondary | ICD-10-CM | POA: Diagnosis not present

## 2015-12-13 DIAGNOSIS — H2513 Age-related nuclear cataract, bilateral: Secondary | ICD-10-CM | POA: Diagnosis not present

## 2015-12-13 DIAGNOSIS — H35033 Hypertensive retinopathy, bilateral: Secondary | ICD-10-CM | POA: Diagnosis not present

## 2015-12-13 DIAGNOSIS — H524 Presbyopia: Secondary | ICD-10-CM | POA: Diagnosis not present

## 2015-12-13 DIAGNOSIS — H33102 Unspecified retinoschisis, left eye: Secondary | ICD-10-CM | POA: Diagnosis not present

## 2015-12-13 DIAGNOSIS — H35372 Puckering of macula, left eye: Secondary | ICD-10-CM | POA: Diagnosis not present

## 2015-12-23 ENCOUNTER — Ambulatory Visit (INDEPENDENT_AMBULATORY_CARE_PROVIDER_SITE_OTHER): Payer: Medicare Other

## 2015-12-23 VITALS — BP 124/72 | HR 77 | Temp 98.4°F | Ht 64.0 in | Wt 149.2 lb

## 2015-12-23 DIAGNOSIS — Z0000 Encounter for general adult medical examination without abnormal findings: Secondary | ICD-10-CM

## 2015-12-23 NOTE — Progress Notes (Signed)
Pre visit review using our clinic review tool, if applicable. No additional management support is needed unless otherwise documented below in the visit note. 

## 2015-12-23 NOTE — Progress Notes (Signed)
PCP notes:   Health maintenance:  Hep C screening - will complete with future labs Flu vaccine - per pt, completed 12/16/15 TDap - per pt, completed 12/22/15  Abnormal screenings:   Fall risk - hx of fall with injury  Patient concerns:   None  Nurse concerns:  None  Next PCP appt:   01/10/16 @ 0900

## 2015-12-23 NOTE — Progress Notes (Signed)
Medical screening examination/treatment/procedure(s) were performed by registered nurse and as supervising non-physician practitioner, I was immediately available for consultation/collaboration.   Laquiesha Piacente, NP  

## 2015-12-23 NOTE — Progress Notes (Signed)
Subjective:   Susan Woods is a 70 y.o. female who presents for Medicare Annual (Subsequent) preventive examination.  Review of Systems:  N/A Cardiac Risk Factors include: advanced age (>33men, >45 women)     Objective:     Vitals: BP 124/72 (BP Location: Right Arm, Patient Position: Sitting, Cuff Size: Normal)   Pulse 77   Temp 98.4 F (36.9 C) (Oral)   Ht 5\' 4"  (1.626 m) Comment: no shoes  Wt 149 lb 4 oz (67.7 kg)   SpO2 99%   BMI 25.62 kg/m   Body mass index is 25.62 kg/m.   Tobacco History  Smoking Status  . Former Smoker  . Quit date: 04/08/1987  Smokeless Tobacco  . Never Used     Counseling given: No   Past Medical History:  Diagnosis Date  . Anemia   . GERD (gastroesophageal reflux disease)   . HTN (hypertension)    echo (8/11): EF 55%, mild MR, mild TR  . Meningioma ([place])    small; advised no f/u unless symptomatic by Dr. Loletta Specter 8/11  . Nasal pruritis    full body w/o rash   . Nonallergic rhinitis   . Osteoporosis   . Psoriasis    mild intermittent   . Tingling    in hands of ? etiol   Past Surgical History:  Procedure Laterality Date  . 2D echo  8/11   nml   . ABDOMINAL HYSTERECTOMY    . ABDOMINAL SURGERY     adhesions  . CESAREAN SECTION    . COLONOSCOPY  2006  . dexa - osteopenia  2005   dexa 2009 - osteoporosis/fairly stable   . hosp TIA  8/11  . NASAL SINUS SURGERY  2002  . OOPHORECTOMY    . ORIF FINGER / THUMB FRACTURE  12/06  . small meningioma    . urethral stricutre     dilated  . VESICOVAGINAL FISTULA CLOSURE W/ TAH     Family History  Problem Relation Age of Onset  . Cancer Father     throat CA  . Diabetes Father   . Heart disease Mother     MI late 38s - CHF   . Hypertension Mother   . Stroke Brother   . Diabetes Brother   . Heart disease Sister     CHF  . Diabetes Sister   . Hypertension Brother   . Diabetes Brother    History  Sexual Activity  . Sexual activity: No    Outpatient  Encounter Prescriptions as of 12/23/2015  Medication Sig  . amLODipine (NORVASC) 10 MG tablet Take 1 tablet by mouth  daily  . Azelastine HCl 0.15 % SOLN Place 2 sprays into the nose 2 (two) times daily.  . Calcium Carb-Cholecalciferol (CALCIUM 600 + D PO) Take 1 tablet by mouth daily.   . Cholecalciferol (VITAMIN D3) 1000 UNITS CAPS Take 1 capsule by mouth daily.    . cyclobenzaprine (FLEXERIL) 10 MG tablet Take 0.5-1 tablets (5-10 mg total) by mouth 3 (three) times daily as needed for muscle spasms (watch for sedation).  . Ferrous Sulfate (FE-CAPS) 250 MG CPCR Take 1 capsule by mouth daily.    . fish oil-omega-3 fatty acids 1000 MG capsule Take 2 g by mouth 2 (two) times daily. Alternate with flax oil  . fluticasone (FLONASE) 50 MCG/ACT nasal spray Use 2 sprays in each  nostril daily  . levocetirizine (XYZAL) 5 MG tablet TAKE 1 TABLET (5 MG TOTAL) BY MOUTH  DAILY AS NEEDED.  . metoprolol succinate (TOPROL-XL) 100 MG 24 hr tablet Take 1 tablet by mouth  daily with or immediately  following a meal  . Multiple Vitamin (MULTIVITAMIN) tablet Take 1 tablet by mouth daily.    . NON FORMULARY similisan dry eye drops and ear drops. UAD PRN    No facility-administered encounter medications on file as of 12/23/2015.     Activities of Daily Living In your present state of health, do you have any difficulty performing the following activities: 12/23/2015  Hearing? N  Vision? N  Difficulty concentrating or making decisions? N  Walking or climbing stairs? N  Dressing or bathing? N  Doing errands, shopping? N  Preparing Food and eating ? N  Using the Toilet? N  In the past six months, have you accidently leaked urine? N  Do you have problems with loss of bowel control? N  Managing your Medications? N  Managing your Finances? N  Housekeeping or managing your Housekeeping? N  Some recent data might be hidden    Patient Care Team: Abner Greenspan, MD as PCP - General Thelma Comp, OD as  Consulting Physician (Optometry)    Assessment:     Hearing Screening   125Hz  250Hz  500Hz  1000Hz  2000Hz  3000Hz  4000Hz  6000Hz  8000Hz   Right ear:   40 40 40  40    Left ear:   40 40 40  40    Vision Screening Comments: Last vision exam on 12/10/15   Exercise Activities and Dietary recommendations Current Exercise Habits: Home exercise routine, Type of exercise: strength training/weights;treadmill;walking;Other - see comments (elliptical), Time (Minutes): > 60, Frequency (Times/Week): 4, Weekly Exercise (Minutes/Week): 0, Intensity: Moderate, Exercise limited by: None identified  Goals    . Increase physical activity          Staring 12/23/2015, I will continue to exercise for at least 60 min 3-4 days per week.       Fall Risk Fall Risk  12/23/2015 12/22/2014 12/31/2012  Falls in the past year? Yes No No   Depression Screen PHQ 2/9 Scores 12/23/2015 12/22/2014 12/31/2012  PHQ - 2 Score 0 0 0     Cognitive Testing MMSE - Mini Mental State Exam 12/23/2015  Orientation to time 5  Orientation to Place 5  Registration 3  Attention/ Calculation 0  Recall 3  Language- name 2 objects 0  Language- repeat 1  Language- follow 3 step command 3  Language- read & follow direction 0  Write a sentence 0  Copy design 0  Total score 20   PLEASE NOTE: A Mini-Cog screen was completed. Maximum score is 20. A value of 0 denotes this part of Folstein MMSE was not completed or the patient failed this part of the Mini-Cog screening.   Mini-Cog Screening Orientation to Time - Max 5 pts Orientation to Place - Max 5 pts Registration - Max 3 pts Recall - Max 3 pts Language Repeat - Max 1 pts Language Follow 3 Step Command - Max 3 pts  Immunization History  Administered Date(s) Administered  . Influenza Split 02/20/2011  . Influenza Whole 12/20/2005  . Influenza, Seasonal, Injecte, Preservative Fre 02/19/2012  . Influenza,inj,Quad PF,36+ Mos 12/31/2012, 05/01/2014, 12/22/2014  .  Pneumococcal Conjugate-13 12/22/2014  . Pneumococcal Polysaccharide-23 12/31/2012  . Td 03/14/1999, 07/03/2006  . Zoster 03/24/2011   Screening Tests Health Maintenance  Topic Date Due  . Hepatitis C Screening  01/10/2016 (Originally 10-05-1945)  . MAMMOGRAM  01/04/2016  . COLON CANCER SCREENING  ANNUAL FOBT  06/13/2016  . COLONOSCOPY  04/25/2025  . TETANUS/TDAP  12/21/2025  . INFLUENZA VACCINE  Addressed  . DEXA SCAN  Completed  . ZOSTAVAX  Completed  . PNA vac Low Risk Adult  Completed      Plan:     I have personally reviewed and addressed the Medicare Annual Wellness questionnaire and have noted the following in the patient's chart:  A. Medical and social history B. Use of alcohol, tobacco or illicit drugs  C. Current medications and supplements D. Functional ability and status E.  Nutritional status F.  Physical activity G. Advance directives H. List of other physicians I.  Hospitalizations, surgeries, and ER visits in previous 12 months J.  [place] to include hearing, vision, cognitive, depression L. Referrals and appointments - none  In addition, I have reviewed and discussed with patient certain preventive protocols, quality metrics, and best practice recommendations. A written personalized care plan for preventive services as well as general preventive health recommendations were provided to patient.  See attached scanned questionnaire for additional information.   Signed,   Lindell Noe, MHA, BS, LPN Health Coach

## 2015-12-23 NOTE — Patient Instructions (Signed)
Ms. Tourangeau , Thank you for taking time to come for your Medicare Wellness Visit. I appreciate your ongoing commitment to your health goals. Please review the following plan we discussed and let me know if I can assist you in the future.   These are the goals we discussed: Goals    . Increase physical activity          Staring 12/23/2015, I will continue to exercise for at least 60 min 3-4 days per week.        This is a list of the screening recommended for you and due dates:  Health Maintenance  Topic Date Due  .  Hepatitis C: One time screening is recommended by Center for Disease Control  (CDC) for  adults born from [birth year] through 1963.   01/10/2016*  . Mammogram  01/04/2016  . Stool Blood Test  06/13/2016  . Colon Cancer Screening  04/25/2025  . Tetanus Vaccine  12/21/2025  . Flu Shot  Addressed  . DEXA scan (bone density measurement)  Completed  . Shingles Vaccine  Completed  . Pneumonia vaccines  Completed  *Topic was postponed. The date shown is not the original due date.   Preventive Care for Adults  A healthy lifestyle and preventive care can promote health and wellness. Preventive health guidelines for adults include the following key practices.  . A routine yearly physical is a good way to check with your health care provider about your health and preventive screening. It is a chance to share any concerns and updates on your health and to receive a thorough exam.  . Visit your dentist for a routine exam and preventive care every 6 months. Brush your teeth twice a day and floss once a day. Good oral hygiene prevents tooth decay and gum disease.  . The frequency of eye exams is based on your age, health, family medical history, use  of contact lenses, and other factors. Follow your health care provider's ecommendations for frequency of eye exams.  . Eat a healthy diet. Foods like vegetables, fruits, whole grains, low-fat dairy products, and lean protein foods contain  the nutrients you need without too many calories. Decrease your intake of foods high in solid fats, added sugars, and salt. Eat the right amount of calories for you. Get information about a proper diet from your health care provider, if necessary.  . Regular physical exercise is one of the most important things you can do for your health. Most adults should get at least 150 minutes of moderate-intensity exercise (any activity that increases your heart rate and causes you to sweat) each week. In addition, most adults need muscle-strengthening exercises on 2 or more days a week.  Silver Sneakers may be a benefit available to you. To determine eligibility, you may visit the website: www.silversneakers.com or contact program at [phone] Mon-Fri between 8AM-8PM.   . Maintain a healthy weight. The body mass index (BMI) is a screening tool to identify possible weight problems. It provides an estimate of body fat based on height and weight. Your health care provider can find your BMI and can help you achieve or maintain a healthy weight.   For adults 20 years and older: ? A BMI below 18.5 is considered underweight. ? A BMI of 18.5 to 24.9 is normal. ? A BMI of 25 to 29.9 is considered overweight. ? A BMI of 30 and above is considered obese.   . Maintain normal blood lipids and cholesterol levels by exercising and  minimizing your intake of saturated fat. Eat a balanced diet with plenty of fruit and vegetables. Blood tests for lipids and cholesterol should begin at age 63 and be repeated every 5 years. If your lipid or cholesterol levels are high, you are over 50, or you are at high risk for heart disease, you may need your cholesterol levels checked more frequently. Ongoing high lipid and cholesterol levels should be treated with medicines if diet and exercise are not working.  . If you smoke, find out from your health care provider how to quit. If you do not use tobacco, please do not start.  . If  you choose to drink alcohol, please do not consume more than 2 drinks per day. One drink is considered to be 12 ounces (355 mL) of beer, 5 ounces (148 mL) of wine, or 1.5 ounces (44 mL) of liquor.  . If you are 8-65 years old, ask your health care provider if you should take aspirin to prevent strokes.  . Use sunscreen. Apply sunscreen liberally and repeatedly throughout the day. You should seek shade when your shadow is shorter than you. Protect yourself by wearing long sleeves, pants, a wide-brimmed hat, and sunglasses year round, whenever you are outdoors.  . Once a month, do a whole body skin exam, using a mirror to look at the skin on your back. Tell your health care provider of new moles, moles that have irregular borders, moles that are larger than a pencil eraser, or moles that have changed in shape or color.

## 2015-12-24 ENCOUNTER — Other Ambulatory Visit: Payer: Self-pay | Admitting: Family Medicine

## 2016-01-05 ENCOUNTER — Encounter: Payer: Self-pay | Admitting: Family Medicine

## 2016-01-05 DIAGNOSIS — Z1231 Encounter for screening mammogram for malignant neoplasm of breast: Secondary | ICD-10-CM | POA: Diagnosis not present

## 2016-01-10 ENCOUNTER — Ambulatory Visit (INDEPENDENT_AMBULATORY_CARE_PROVIDER_SITE_OTHER): Payer: Medicare Other | Admitting: Family Medicine

## 2016-01-10 ENCOUNTER — Encounter: Payer: Self-pay | Admitting: Family Medicine

## 2016-01-10 VITALS — BP 128/80 | HR 64 | Temp 97.5°F | Ht 64.0 in | Wt 150.8 lb

## 2016-01-10 DIAGNOSIS — Z0000 Encounter for general adult medical examination without abnormal findings: Secondary | ICD-10-CM | POA: Diagnosis not present

## 2016-01-10 DIAGNOSIS — I10 Essential (primary) hypertension: Secondary | ICD-10-CM | POA: Diagnosis not present

## 2016-01-10 DIAGNOSIS — M810 Age-related osteoporosis without current pathological fracture: Secondary | ICD-10-CM

## 2016-01-10 LAB — CBC WITH DIFFERENTIAL/PLATELET
Basophils Absolute: 0 10*3/uL (ref 0.0–0.1)
Basophils Relative: 0.5 % (ref 0.0–3.0)
EOS ABS: 0.2 10*3/uL (ref 0.0–0.7)
Eosinophils Relative: 3.4 % (ref 0.0–5.0)
HEMATOCRIT: 36 % (ref 36.0–46.0)
Hemoglobin: 12.1 g/dL (ref 12.0–15.0)
LYMPHS PCT: 40.7 % (ref 12.0–46.0)
Lymphs Abs: 1.9 10*3/uL (ref 0.7–4.0)
MCHC: 33.6 g/dL (ref 30.0–36.0)
MCV: 87.3 fl (ref 78.0–100.0)
MONOS PCT: 6.5 % (ref 3.0–12.0)
Monocytes Absolute: 0.3 10*3/uL (ref 0.1–1.0)
NEUTROS ABS: 2.2 10*3/uL (ref 1.4–7.7)
Neutrophils Relative %: 48.9 % (ref 43.0–77.0)
PLATELETS: 241 10*3/uL (ref 150.0–400.0)
RBC: 4.13 Mil/uL (ref 3.87–5.11)
RDW: 14.4 % (ref 11.5–15.5)
WBC: 4.6 10*3/uL (ref 4.0–10.5)

## 2016-01-10 LAB — COMPREHENSIVE METABOLIC PANEL
ALT: 11 U/L (ref 0–35)
AST: 20 U/L (ref 0–37)
Albumin: 4.2 g/dL (ref 3.5–5.2)
Alkaline Phosphatase: 61 U/L (ref 39–117)
BUN: 9 mg/dL (ref 6–23)
CALCIUM: 9.5 mg/dL (ref 8.4–10.5)
CHLORIDE: 104 meq/L (ref 96–112)
CO2: 30 meq/L (ref 19–32)
CREATININE: 0.72 mg/dL (ref 0.40–1.20)
GFR: 103.52 mL/min (ref >60.00)
GLUCOSE: 90 mg/dL (ref 70–99)
Potassium: 3.8 mEq/L (ref 3.5–5.1)
SODIUM: 140 meq/L (ref 135–145)
Total Bilirubin: 0.5 mg/dL (ref 0.2–1.2)
Total Protein: 7.3 g/dL (ref 6.0–8.3)

## 2016-01-10 LAB — TSH: TSH: 0.34 u[IU]/mL — ABNORMAL LOW (ref 0.35–4.50)

## 2016-01-10 LAB — LIPID PANEL
CHOL/HDL RATIO: 3
CHOLESTEROL: 147 mg/dL (ref 0–200)
HDL: 53 mg/dL (ref >39.00)
LDL CALC: 80 mg/dL (ref 0–99)
NonHDL: 94.34
Triglycerides: 72 mg/dL (ref 0.0–149.0)
VLDL: 14.4 mg/dL (ref 0.0–40.0)

## 2016-01-10 MED ORDER — METOPROLOL SUCCINATE ER 100 MG PO TB24: ORAL_TABLET | ORAL | 3 refills | Status: DC

## 2016-01-10 MED ORDER — AMLODIPINE BESYLATE 10 MG PO TABS: 10.0000 mg | ORAL_TABLET | Freq: Every day | ORAL | 3 refills | Status: DC

## 2016-01-10 MED ORDER — FLUTICASONE PROPIONATE 50 MCG/ACT NA SUSP: 2.0000 | Freq: Every day | NASAL | 3 refills | Status: DC

## 2016-01-10 NOTE — Patient Instructions (Signed)
Keep up the good work with self care  You are keeping a good balance Labs today

## 2016-01-10 NOTE — Progress Notes (Signed)
Pre visit review using our clinic review tool, if applicable. No additional management support is needed unless otherwise documented below in the visit note. 

## 2016-01-10 NOTE — Assessment & Plan Note (Signed)
bp is better on 2nd check today  bp in fair control at this time  BP Readings from Last 1 Encounters:  01/10/16 128/80   No changes needed Disc lifstyle change with low sodium diet and exercise

## 2016-01-10 NOTE — Progress Notes (Signed)
Subjective:    Patient ID: Susan Woods, female    DOB: 07-16-1945, 70 y.o.   MRN: [id number]  HPI Here for health maintenance exam and to review chronic medical problems    Some sinus symptoms this time of year   Wt Readings from Last 3 Encounters:  01/10/16 150 lb 12 oz (68.4 kg)  12/23/15 149 lb 4 oz (67.7 kg)  08/20/15 151 lb (68.5 kg)   bmi is 25.8 Feels good  Mindful of what she eats  [employer] for exercise and walks/machines   Saw Lesia for AMW visit earlier this mo Discussed fall history (talked about fall prevention and [facility]  She fell wearing flip flops on the stairs  No c/o  Updated vaccines   Colon cancer screen neg FOBT 4/17 neg  Mammogram 10/17-normal Self breast exam - no lumps or changes   Colonoscopy 2/17-normal/ 10 year recall   dexa 11/14- osteopenia / worse at the hip She does not want to take medicine - did not tolerate alendronate  One fall without a fracture  Declines another dexa at this time   bp is higher on first check today - better on 2nd check 128/80 No cp or palpitations or headaches or edema  No side effects to medicines  BP Readings from Last 3 Encounters:  01/10/16 136/90  12/23/15 124/72  08/20/15 126/70    She has hx of TIA and also hypertensive retinopathy = she has rather eye exams  No changes this month      Chemistry      Component Value Date/Time   NA 140 12/22/2014 1051   K 4.0 12/22/2014 1051   CL 104 12/22/2014 1051   CO2 27 12/22/2014 1051   BUN 9 12/22/2014 1051   CREATININE 0.64 12/22/2014 1051      Component Value Date/Time   CALCIUM 9.5 12/22/2014 1051   ALKPHOS 64 12/22/2014 1051   AST 22 12/22/2014 1051   ALT 12 12/22/2014 1051   BILITOT 0.7 12/22/2014 1051      Lab Results  Component Value Date   WBC 5.2 12/22/2014   HGB 12.7 12/22/2014   HCT 38.3 12/22/2014   MCV 89.0 12/22/2014   PLT 224.0 12/22/2014    Lab Results  Component  Value Date   TSH 0.48 12/22/2014    Lab Results  Component Value Date   CHOL 149 12/22/2014   CHOL 141 08/12/2012   CHOL 152 02/20/2011   Lab Results  Component Value Date   HDL 56.80 12/22/2014   HDL 52.30 08/12/2012   HDL 53.60 02/20/2011   Lab Results  Component Value Date   LDLCALC 81 12/22/2014   LDLCALC 70 08/12/2012   LDLCALC 86 02/20/2011   Lab Results  Component Value Date   TRIG 58.0 12/22/2014   TRIG 92.0 08/12/2012   TRIG 64.0 02/20/2011   Lab Results  Component Value Date   CHOLHDL 3 12/22/2014   CHOLHDL 3 08/12/2012   CHOLHDL 3 02/20/2011   No results found for: LDLDIRECT   Blood glucose was 86   Good labs last time-will have today  Had hep C titer at [facility] and it was normal   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 Sinus pressure 06/22/2015  . Allergic reaction 06/22/2015  . 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 Colon cancer screening 12/22/2014  . Myalgia  08/26/2014  . Joint pain 08/26/2014  . Low back pain 05/18/2014  . Body aches 05/01/2014  . Hypertensive retinopathy of both eyes, grade 1 12/16/2013  . Bilateral foot pain 01/30/2013  . Encounter for Medicare annual wellness exam 12/31/2012  . Vaginal pain 06/07/2012  . Urethritis 06/07/2012  . Routine general medical examination at a health care facility 02/20/2011  . PRURITUS 04/18/2010  . MENINGIOMA 11/17/2009  . TIA 11/17/2009  . Osteoporosis 03/19/2007  . HYPOGLYCEMIA 06/12/2006  . ANEMIA 06/12/2006  . Essential hypertension 06/12/2006  . Allergic rhinitis 06/12/2006  . GERD 06/12/2006   Past Medical History:  Diagnosis Date  . Anemia   . GERD (gastroesophageal reflux disease)   . HTN (hypertension)    echo (8/11): EF 55%, mild MR, mild TR  . Meningioma ([place])    small; advised no f/u unless symptomatic by Dr. Loletta Specter 8/11  . Nasal pruritis    full body w/o rash   . Nonallergic rhinitis   . Osteoporosis   . Psoriasis      mild intermittent   . Tingling    in hands of ? etiol   Past Surgical History:  Procedure Laterality Date  . 2D echo  8/11   nml   . ABDOMINAL HYSTERECTOMY    . ABDOMINAL SURGERY     adhesions  . CESAREAN SECTION    . COLONOSCOPY  2006  . dexa - osteopenia  2005   dexa 2009 - osteoporosis/fairly stable   . hosp TIA  8/11  . NASAL SINUS SURGERY  2002  . OOPHORECTOMY    . ORIF FINGER / THUMB FRACTURE  12/06  . small meningioma    . urethral stricutre     dilated  . VESICOVAGINAL FISTULA CLOSURE W/ TAH     Social History  Substance Use Topics  . Smoking status: Former Smoker    Quit date: 04/08/1987  . Smokeless tobacco: Never Used  . Alcohol use 0.0 oz/week     Comment: rarely   Family History  Problem Relation Age of Onset  . Cancer Father     throat CA  . Diabetes Father   . Heart disease Mother     MI late 47s - CHF   . Hypertension Mother   . Stroke Brother   . Diabetes Brother   . Heart disease Sister     CHF  . Diabetes Sister   . Hypertension Brother   . Diabetes Brother    Allergies  Allergen Reactions  . Indomethacin     REACTION: swelling, rash  . Levaquin [Levofloxacin In D5w] Other (See Comments)    Muscle pain   . Penicillins     REACTION: urticaria (hives)  . Sulfonamide Derivatives     REACTION: tingling around mouth and hands felt tight  . Zithromax [Azithromycin]    Current Outpatient Prescriptions on File Prior to Visit  Medication Sig Dispense Refill  . Calcium Carb-Cholecalciferol (CALCIUM 600 + D PO) Take 1 tablet by mouth daily.     . Cholecalciferol (VITAMIN D3) 1000 UNITS CAPS Take 1 capsule by mouth daily.      . cyclobenzaprine (FLEXERIL) 10 MG tablet Take 0.5-1 tablets (5-10 mg total) by mouth 3 (three) times daily as needed for muscle spasms (watch for sedation). 30 tablet 0  . Ferrous Sulfate (FE-CAPS) 250 MG CPCR Take 1 capsule by mouth daily.      . fish oil-omega-3 fatty acids 1000 MG capsule Take 2 g by mouth 2 (  two)  times daily. Alternate with flax oil    . Multiple Vitamin (MULTIVITAMIN) tablet Take 1 tablet by mouth daily.      . NON FORMULARY similisan dry eye drops and ear drops. UAD PRN      No current facility-administered medications on file prior to visit.     Review of Systems    Review of Systems  Constitutional: Negative for fever, appetite change, fatigue and unexpected weight change.  Eyes: Negative for pain and visual disturbance.  Respiratory: Negative for cough and shortness of breath.   Cardiovascular: Negative for cp or palpitations    Gastrointestinal: Negative for nausea, diarrhea and constipation.  Genitourinary: Negative for urgency and frequency.  Skin: Negative for pallor or rash   Neurological: Negative for weakness, light-headedness, numbness and headaches.  Hematological: Negative for adenopathy. Does not bruise/bleed easily.  Psychiatric/Behavioral: Negative for dysphoric mood. The patient is not nervous/anxious.      Objective:   Physical Exam  Constitutional: She appears well-developed and well-nourished. No distress.  Well appearing   HENT:  Head: Normocephalic and atraumatic.  Right Ear: External ear normal.  Left Ear: External ear normal.  Mouth/Throat: Oropharynx is clear and moist.  Eyes: Conjunctivae and EOM are normal. Pupils are equal, round, and reactive to light. No scleral icterus.  Neck: Normal range of motion. Neck supple. No JVD present. Carotid bruit is not present. No thyromegaly present.  Cardiovascular: Normal rate, regular rhythm, normal heart sounds and intact distal pulses.  Exam reveals no gallop.   Pulmonary/Chest: Effort normal and breath sounds normal. No respiratory distress. She has no wheezes. She exhibits no tenderness.  Abdominal: Soft. Bowel sounds are normal. She exhibits no distension, no abdominal bruit and no mass. There is no tenderness.  Genitourinary: No breast swelling, tenderness, discharge or bleeding.  Genitourinary  Comments: Breast exam: No mass, nodules, thickening, tenderness, bulging, retraction, inflamation, nipple discharge or skin changes noted.  No axillary or clavicular LA.      Musculoskeletal: Normal range of motion. She exhibits no edema or tenderness.  Lymphadenopathy:    She has no cervical adenopathy.  Neurological: She is alert. She has normal reflexes. No cranial nerve deficit. She exhibits normal muscle tone. Coordination normal.  Skin: Skin is warm and dry. No rash noted. No erythema. No pallor.  Some skin tags and lentigines   Psychiatric: She has a normal mood and affect.          Assessment & Plan:   Problem List Items Addressed This Visit      Cardiovascular and Mediastinum   Essential hypertension - Primary    bp is better on 2nd check today  bp in fair control at this time  BP Readings from Last 1 Encounters:  01/10/16 128/80   No changes needed Disc lifstyle change with low sodium diet and exercise        Relevant Medications   metoprolol succinate (TOPROL-XL) 100 MG 24 hr tablet   amLODipine (NORVASC) 10 MG tablet     Musculoskeletal and Integument   Osteoporosis    Pt declines dexa or tx at this time Rev last dexa Intol of oral bisphosphenate Disc fall prev  Disc need for calcium/ vitamin D/ wt bearing exercise and bone density test every 2 y to monitor Disc safety/ fracture risk in detail          Other   Routine general medical examination at a health care facility    Reviewed health habits including diet  and exercise and skin cancer prevention Reviewed appropriate screening tests for age  Also reviewed health mt list, fam hx and immunization status , as well as social and family history   See HPI Labs for wellness ordered  AMW visit reviewed Declines dexa  Vaccines are utd       Relevant Orders   CBC with Differential/Platelet (Completed)   Comprehensive metabolic panel (Completed)   TSH (Completed)   Lipid panel (Completed)    Other  Visit Diagnoses   None.

## 2016-01-10 NOTE — Assessment & Plan Note (Signed)
Reviewed health habits including diet and exercise and skin cancer prevention Reviewed appropriate screening tests for age  Also reviewed health mt list, fam hx and immunization status , as well as social and family history   See HPI Labs for wellness ordered  AMW visit reviewed Declines dexa  Vaccines are utd

## 2016-01-10 NOTE — Assessment & Plan Note (Signed)
Pt declines dexa or tx at this time Rev last dexa Intol of oral bisphosphenate Disc fall prev  Disc need for calcium/ vitamin D/ wt bearing exercise and bone density test every 2 y to monitor Disc safety/ fracture risk in detail

## 2016-01-11 ENCOUNTER — Other Ambulatory Visit (INDEPENDENT_AMBULATORY_CARE_PROVIDER_SITE_OTHER): Payer: Medicare Other

## 2016-01-11 DIAGNOSIS — R946 Abnormal results of thyroid function studies: Secondary | ICD-10-CM

## 2016-01-11 LAB — T4, FREE: FREE T4: 0.55 ng/dL — AB (ref 0.60–1.60)

## 2016-01-12 ENCOUNTER — Telehealth: Payer: Self-pay | Admitting: Family Medicine

## 2016-01-12 NOTE — Telephone Encounter (Signed)
Addressed through result notes  

## 2016-01-12 NOTE — Telephone Encounter (Signed)
Patient returned Shapale's call.  Patient said you can try to get her at home.  If you can't reach her at home,call her cell phone-[phone].

## 2016-01-13 ENCOUNTER — Telehealth: Payer: Self-pay | Admitting: Family Medicine

## 2016-01-13 DIAGNOSIS — E039 Hypothyroidism, unspecified: Secondary | ICD-10-CM | POA: Insufficient documentation

## 2016-01-13 DIAGNOSIS — R7989 Other specified abnormal findings of blood chemistry: Secondary | ICD-10-CM

## 2016-01-13 NOTE — Telephone Encounter (Signed)
-----   Message from Tammi Sou, [place] sent at 01/12/2016  4:13 PM EDT ----- Pt notified of lab results and Dr. Marliss Coots comments and agreed with referral, please put referral in and I advise pt our [facility] will call to schedule appt

## 2016-01-13 NOTE — Telephone Encounter (Signed)
Ref done will route to [facility]

## 2016-02-23 ENCOUNTER — Telehealth: Payer: Self-pay

## 2016-02-23 NOTE — Telephone Encounter (Signed)
Pt left v/m; pt wants to know what brand magnesium Dr Glori Bickers would recommend that will absorb well. Pt request cb. Pt last seen 01/10/16.

## 2016-02-23 NOTE — Telephone Encounter (Signed)
Pt notified of Dr. Tower's recommendations and verbalized understanding  

## 2016-02-23 NOTE — Telephone Encounter (Signed)
If she feels like she needs it - any otc /no preference to brand  Start with smallest dose possible-it can cause diarrhea

## 2016-03-02 DIAGNOSIS — R946 Abnormal results of thyroid function studies: Secondary | ICD-10-CM | POA: Diagnosis not present

## 2016-03-15 ENCOUNTER — Encounter: Payer: Medicare Other | Admitting: Family Medicine

## 2016-03-16 ENCOUNTER — Telehealth: Payer: Self-pay

## 2016-03-16 DIAGNOSIS — R58 Hemorrhage, not elsewhere classified: Secondary | ICD-10-CM

## 2016-03-16 MED ORDER — HYDROCORTISONE ACETATE 25 MG RE SUPP: 25.0000 mg | Freq: Every day | RECTAL | 1 refills | Status: DC

## 2016-03-16 NOTE — Telephone Encounter (Signed)
She will need to call the pharmacy back and get a list of covered alternatives for me to choose from  Thanks

## 2016-03-16 NOTE — Telephone Encounter (Signed)
She did have internal hemorrhoids on her last colonoscopy 2/17 Please send in anusul [facility] suppositories 1 PR qhs for 10 d   #10 1 ref  Alert me if this does not help  Use stool softeners if needed to prevent straining with bms  Also avoid heavy lifting/ straining in general   Please add internal hemorrhoids to her problem list  Thanks

## 2016-03-16 NOTE — Telephone Encounter (Signed)
Pt would like to know if she can get a prescription for hemorrhoids or what would you recommend that she can buy OTC, pt states that every time she goes to the bathroom she bleeds and she's satrtingg to worry.

## 2016-03-16 NOTE — Telephone Encounter (Signed)
Rx sent to pharmacy and pt notified of Dr. Tower's comments/instructions and verbalized understanding  

## 2016-03-16 NOTE — Telephone Encounter (Signed)
Patient called and said she'll call her pharmacy to find out the alternatives.

## 2016-03-16 NOTE — Telephone Encounter (Signed)
Pt is requesting a different suppository she said she just received a call from her pharmacy and the one we sent isn't covered by her insurance.

## 2016-03-17 ENCOUNTER — Telehealth: Payer: Self-pay

## 2016-03-17 MED ORDER — HYDROCORTISONE 2.5 % RE CREA: 1.0000 "application " | TOPICAL_CREAM | Freq: Every day | RECTAL | 1 refills | Status: DC

## 2016-03-17 NOTE — Telephone Encounter (Signed)
Addressed through another phone note 

## 2016-03-17 NOTE — Telephone Encounter (Signed)
Pt notified Rx sent to pharmacy

## 2016-03-17 NOTE — Telephone Encounter (Signed)
I sent anusol hc cream  anucort did not show up in epic- I think it is the same thanks

## 2016-03-17 NOTE — Telephone Encounter (Signed)
Pt states that insurance would not cover for the suppository they will only cover for the cream anucort HC.   [facility] [place].

## 2016-04-17 DIAGNOSIS — E038 Other specified hypothyroidism: Secondary | ICD-10-CM | POA: Diagnosis not present

## 2016-04-27 ENCOUNTER — Encounter: Payer: Self-pay | Admitting: Family Medicine

## 2016-04-27 ENCOUNTER — Ambulatory Visit (INDEPENDENT_AMBULATORY_CARE_PROVIDER_SITE_OTHER): Payer: Medicare Other | Admitting: Family Medicine

## 2016-04-27 VITALS — BP 138/80 | HR 63 | Temp 98.3°F | Ht 64.0 in | Wt 153.8 lb

## 2016-04-27 DIAGNOSIS — M79651 Pain in right thigh: Secondary | ICD-10-CM

## 2016-04-27 DIAGNOSIS — K648 Other hemorrhoids: Secondary | ICD-10-CM

## 2016-04-27 DIAGNOSIS — M79652 Pain in left thigh: Secondary | ICD-10-CM | POA: Diagnosis not present

## 2016-04-27 NOTE — Patient Instructions (Signed)
Apply proctosol rectally x 3-4 days when rectal bleeding seeing.  Increae water and fiber in diet.  Stretch legs and low back if thigh pain not continuing to improve.. Follow up with PCP.

## 2016-04-27 NOTE — Assessment & Plan Note (Signed)
Improved with treatment of thyroid. If not continuing to improve consider spinal stenosis as cause given mild low back pain.  Pt not on statin. No joint issue associated.

## 2016-04-27 NOTE — Progress Notes (Signed)
Subjective:    Patient ID: Susan Woods, female    DOB: May 21, 1945, 71 y.o.   MRN: [id number]  HPI   71 year old female  Pt of  Dr. Marliss Coots presents for new onset bleeding hemorrhoids.  She reports yesterday she had noted bright red blood in toilet. Drips and on toilet paper. She has been having more frequent bowel movements.Marland Kitchen 4 BMs yesterday, not diarrhea. Has been more frequent since on levothyroxine 03/04/2017. No pain with BM, no change in caliber. Not associated with urination, not clearly from uterus.   Earlier in January similar episode occurred. She had been given steroid cream in last month over phone.. proctosol..  Had not used yet given bleeding stopped.  She was having constipation at that time.   Colonoscopy 04/2015: int hemorrhoids.  S/P TAH.    She has also noted some pain in thighs.. Better with  Starting thyroid medicaiton. Exercising regualrly Review of Systems  Constitutional: Negative for fatigue and fever.  HENT: Negative for ear pain.   Eyes: Negative for pain.  Respiratory: Negative for chest tightness and shortness of breath.   Cardiovascular: Negative for chest pain, palpitations and leg swelling.  Gastrointestinal: Negative for abdominal pain.  Genitourinary: Negative for dysuria.  Musculoskeletal: Positive for back pain.       Mild soreness in low back       Objective:   Physical Exam  Constitutional: Vital signs are normal. She appears well-developed and well-nourished. She is cooperative.  Non-toxic appearance. She does not appear ill. No distress.  HENT:  Head: Normocephalic.  Right Ear: Hearing, tympanic membrane, external ear and ear canal normal. Tympanic membrane is not erythematous, not retracted and not bulging.  Left Ear: Hearing, tympanic membrane, external ear and ear canal normal. Tympanic membrane is not erythematous, not retracted and not bulging.  Nose: No mucosal edema or rhinorrhea. Right sinus exhibits no maxillary  sinus tenderness and no frontal sinus tenderness. Left sinus exhibits no maxillary sinus tenderness and no frontal sinus tenderness.  Mouth/Throat: Uvula is midline, oropharynx is clear and moist and mucous membranes are normal.  Eyes: Conjunctivae, EOM and lids are normal. Pupils are equal, round, and reactive to light. Lids are everted and swept, no foreign bodies found.  Neck: Trachea normal and normal range of motion. Neck supple. Carotid bruit is not present. No thyroid mass and no thyromegaly present.  Cardiovascular: Normal rate, regular rhythm, S1 normal, S2 normal, normal heart sounds, intact distal pulses and normal pulses.  Exam reveals no gallop and no friction rub.   No murmur heard. Pulmonary/Chest: Effort normal and breath sounds normal. No tachypnea. No respiratory distress. She has no decreased breath sounds. She has no wheezes. She has no rhonchi. She has no rales.  Abdominal: Soft. Normal appearance and bowel sounds are normal. There is no tenderness.  Genitourinary: Rectal exam shows internal hemorrhoid. Rectal exam shows no external hemorrhoid, no fissure, no mass, no tenderness, anal tone normal and guaiac negative stool.  Genitourinary Comments: Rectal tag also noted  Neurological: She is alert. She has normal strength. No cranial nerve deficit or sensory deficit. Coordination and gait normal.  Skin: Skin is warm, dry and intact. No rash noted.  Psychiatric: Her speech is normal and behavior is normal. Judgment and thought content normal. Her mood appears not anxious. Cognition and memory are normal. She does not exhibit a depressed mood.      Anoscopy performed: no bleeding seen, grade 1 int hemorrhoids visualize  No  complicaitons to procedure. No pain reported.     Assessment & Plan:

## 2016-04-27 NOTE — Progress Notes (Signed)
Pre visit review using our clinic review tool, if applicable. No additional management support is needed unless otherwise documented below in the visit note. 

## 2016-04-27 NOTE — Assessment & Plan Note (Signed)
Treat with rectal cream with rectal applicator. Decrease constipation with fiber and water.

## 2016-05-29 DIAGNOSIS — E038 Other specified hypothyroidism: Secondary | ICD-10-CM | POA: Diagnosis not present

## 2016-06-27 ENCOUNTER — Encounter: Payer: Self-pay | Admitting: Family Medicine

## 2016-06-27 ENCOUNTER — Ambulatory Visit (INDEPENDENT_AMBULATORY_CARE_PROVIDER_SITE_OTHER): Payer: Medicare Other | Admitting: Family Medicine

## 2016-06-27 VITALS — BP 134/86 | HR 65 | Temp 98.9°F | Ht 64.0 in | Wt 153.5 lb

## 2016-06-27 DIAGNOSIS — R208 Other disturbances of skin sensation: Secondary | ICD-10-CM

## 2016-06-27 DIAGNOSIS — I10 Essential (primary) hypertension: Secondary | ICD-10-CM | POA: Diagnosis not present

## 2016-06-27 LAB — VITAMIN B12: Vitamin B-12: 1183 pg/mL — ABNORMAL HIGH (ref 211–911)

## 2016-06-27 NOTE — Progress Notes (Signed)
Pre visit review using our clinic review tool, if applicable. No additional management support is needed unless otherwise documented below in the visit note. 

## 2016-06-27 NOTE — Patient Instructions (Signed)
Keep walking  Avoid too tight or too loose shoes and socks Diabetic socks are less binding at the top- may be more comfortable Take care of your feet  Elevate them when you sit   Drink lots of water  Avoid excess sodium /processed foods to avoid swelling and elevated blood pressure   We will watch the burning in your feet- this may be early neuropathy and I want to check a B12 level today   If symptoms worsen please let me know

## 2016-06-27 NOTE — Progress Notes (Signed)
Subjective:    Patient ID: Susan Woods, female    DOB: [birth year], 71 y.o.   MRN: [id number]  HPI Here for c/o discoloration around ankles   Wt Readings from Last 3 Encounters:  06/27/16 153 lb 8 oz (69.6 kg)  04/27/16 153 lb 12 oz (69.7 kg)  01/10/16 150 lb 12 oz (68.4 kg)   bmi 26.3  Hx of HTN and past TIA bp is stable today  No cp or palpitations or headaches or edema  No side effects to medicines  BP Readings from Last 3 Encounters:  06/27/16 134/86  04/27/16 138/80  01/10/16 128/80     Ankles have been darker in color  Doing a lot of walking  They burn a bit after prolonged walking (walks 5 mi) and tight  No pitting  Wearing ankle wraps help   A little burning sensation when she still and lying in bed   Thyroid has been in good control   To walk - she wears low socks   Patient Active Problem List   Diagnosis Date Noted  . Burning sensation of feet 06/27/2016  . Internal hemorrhoids 04/27/2016  . Bilateral thigh pain 04/27/2016  . Abnormal thyroid blood test 01/13/2016  . Allergic reaction 06/22/2015  . 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 Colon cancer screening 12/22/2014  . Joint pain 08/26/2014  . Low back pain 05/18/2014  . Hypertensive retinopathy of both eyes, grade 1 12/16/2013  . Encounter for Medicare annual wellness exam 12/31/2012  . Urethritis 06/07/2012  . Routine general medical examination at a health care facility 02/20/2011  . PRURITUS 04/18/2010  . MENINGIOMA 11/17/2009  . TIA 11/17/2009  . Osteoporosis 03/19/2007  . HYPOGLYCEMIA 06/12/2006  . ANEMIA 06/12/2006  . Essential hypertension 06/12/2006  . Allergic rhinitis 06/12/2006  . GERD 06/12/2006   Past Medical History:  Diagnosis Date  . Anemia   . GERD (gastroesophageal reflux disease)   . HTN (hypertension)    echo (8/11): EF 55%, mild MR, mild TR  . Meningioma ([place])    small; advised no f/u unless symptomatic by Dr. Loletta Specter 8/11  . Nasal pruritis    full body w/o  rash   . Nonallergic rhinitis   . Osteoporosis   . Psoriasis    mild intermittent   . Tingling    in hands of ? etiol   Past Surgical History:  Procedure Laterality Date  . 2D echo  8/11   nml   . ABDOMINAL HYSTERECTOMY    . ABDOMINAL SURGERY     adhesions  . CESAREAN SECTION    . COLONOSCOPY  2006  . dexa - osteopenia  2005   dexa 2009 - osteoporosis/fairly stable   . hosp TIA  8/11  . NASAL SINUS SURGERY  2002  . OOPHORECTOMY    . ORIF FINGER / THUMB FRACTURE  12/06  . small meningioma    . urethral stricutre     dilated  . VESICOVAGINAL FISTULA CLOSURE W/ TAH     Social History  Substance Use Topics  . Smoking status: Former Smoker    Quit date: 04/08/1987  . Smokeless tobacco: Never Used  . Alcohol use 0.0 oz/week     Comment: rarely   Family History  Problem Relation Age of Onset  . Cancer Father     throat CA  . Diabetes Father   . Heart disease Mother     MI late 37s - CHF   . Hypertension Mother   . Stroke  Brother   . Diabetes Brother   . Heart disease Sister     CHF  . Diabetes Sister   . Hypertension Brother   . Diabetes Brother    Allergies  Allergen Reactions  . Indomethacin     REACTION: swelling, rash  . Levaquin [Levofloxacin In D5w] Other (See Comments)    Muscle pain   . Penicillins     REACTION: urticaria (hives)  . Sulfonamide Derivatives     REACTION: tingling around mouth and hands felt tight  . Zithromax [Azithromycin]   . Povidone-Iodine Hives and Itching   Current Outpatient Prescriptions on File Prior to Visit  Medication Sig Dispense Refill  . amLODipine (NORVASC) 10 MG tablet Take 1 tablet (10 mg total) by mouth daily. 90 tablet 3  . Calcium Carb-Cholecalciferol (CALCIUM 600 + D PO) Take 1 tablet by mouth daily.     . Cholecalciferol (VITAMIN D3) 1000 UNITS CAPS Take 1 capsule by mouth daily.      . cyclobenzaprine (FLEXERIL) 10 MG tablet Take 0.5-1 tablets (5-10 mg total) by mouth 3 (three) times daily as needed for  muscle spasms (watch for sedation). 30 tablet 0  . Ferrous Sulfate (FE-CAPS) 250 MG CPCR Take 1 capsule by mouth daily.      . fish oil-omega-3 fatty acids 1000 MG capsule Take 2 g by mouth 2 (two) times daily. Alternate with flax oil    . fluticasone (FLONASE) 50 MCG/ACT nasal spray Place 2 sprays into both nostrils daily. 48 g 3  . hydrocortisone (ANUSOL-HC) 2.5 % rectal cream Place 1 application rectally at bedtime. 30 g 1  . levothyroxine (SYNTHROID, LEVOTHROID) 50 MCG tablet Take by mouth.    . metoprolol succinate (TOPROL-XL) 100 MG 24 hr tablet Take 1 tablet by mouth  daily with or immediately  following a meal 90 tablet 3  . Multiple Vitamin (MULTIVITAMIN) tablet Take 1 tablet by mouth daily.      . NON FORMULARY similisan dry eye drops and ear drops. UAD PRN      No current facility-administered medications on file prior to visit.      Review of Systems Review of Systems  Constitutional: Negative for fever, appetite change, fatigue and unexpected weight change.  Eyes: Negative for pain and visual disturbance.  Respiratory: Negative for cough and shortness of breath.   Cardiovascular: Negative for cp or palpitations    Gastrointestinal: Negative for nausea, diarrhea and constipation.  Genitourinary: Negative for urgency and frequency.  Skin: Negative for pallor or rash  pos for discoloration of ankles  Neurological: Negative for weakness, light-headedness, numbness and headaches. pos for occ burning /tingling sens in feet  Hematological: Negative for adenopathy. Does not bruise/bleed easily.  Psychiatric/Behavioral: Negative for dysphoric mood. The patient is not nervous/anxious.         Objective:   Physical Exam  Constitutional: She appears well-developed and well-nourished. No distress.  Well appearing   HENT:  Head: Normocephalic and atraumatic.  Mouth/Throat: Oropharynx is clear and moist.  Eyes: Conjunctivae and EOM are normal. Pupils are equal, round, and reactive to  light.  Neck: Normal range of motion. Neck supple. No JVD present. Carotid bruit is not present. No thyromegaly present.  Cardiovascular: Normal rate, regular rhythm, normal heart sounds and intact distal pulses.  Exam reveals no gallop.   Warm feet with excellent pedal pulses No varicosities  Pulmonary/Chest: Effort normal and breath sounds normal. No respiratory distress. She has no wheezes. She has no rales.  No  crackles  Abdominal: Soft. Bowel sounds are normal. She exhibits no distension, no abdominal bruit and no mass. There is no tenderness.  Musculoskeletal: She exhibits no edema, tenderness or deformity.  No ankle or foot swelling  Lymphadenopathy:    She has no cervical adenopathy.  Neurological: She is alert. She has normal reflexes. No cranial nerve deficit. She exhibits normal muscle tone. Coordination normal.  Nl sens to temp/ soft/sharp in feet    Skin: Skin is warm and dry. No rash noted. No pallor.  Very slt hyperpigmentation of feet at sock line  No cyanosis  Warm/ no rash   Psychiatric: She has a normal mood and affect.          Assessment & Plan:   Problem List Items Addressed This Visit      Cardiovascular and Mediastinum   Essential hypertension - Primary    bp in fair control at this time  BP Readings from Last 1 Encounters:  06/27/16 134/86   No changes needed Disc lifstyle change with low sodium diet and exercise          Other   Burning sensation of feet    Along with some late day puffiness and also hyperpigmentation at sock line in a pt that walks long distances Re assuring exam with good perfusion/pulses  Disc changing to less tight socks for walking  Elevate feet  Watch for s/s of neuropathy  B12 level today      Relevant Orders   Vitamin B12 (Completed)

## 2016-06-28 NOTE — Assessment & Plan Note (Signed)
Along with some late day puffiness and also hyperpigmentation at sock line in a pt that walks long distances Re assuring exam with good perfusion/pulses  Disc changing to less tight socks for walking  Elevate feet  Watch for s/s of neuropathy  B12 level today

## 2016-06-28 NOTE — Assessment & Plan Note (Signed)
bp in fair control at this time  BP Readings from Last 1 Encounters:  06/27/16 134/86   No changes needed Disc lifstyle change with low sodium diet and exercise

## 2016-08-12 ENCOUNTER — Encounter: Payer: Self-pay | Admitting: Family Medicine

## 2016-08-14 ENCOUNTER — Other Ambulatory Visit: Payer: Self-pay

## 2016-08-14 MED ORDER — CYCLOBENZAPRINE HCL 10 MG PO TABS: 5.0000 mg | ORAL_TABLET | Freq: Three times a day (TID) | ORAL | 0 refills | Status: DC | PRN

## 2016-08-14 NOTE — Telephone Encounter (Signed)
Pt left v/m requesting refill muscle relaxant to [facility]; last refilled cyclobenzaprine # 30 on 08/03/14; last annual 01/10/16.Please advise.

## 2016-08-14 NOTE — Telephone Encounter (Signed)
Will refill electronically  

## 2016-08-25 ENCOUNTER — Encounter: Payer: Self-pay | Admitting: Family Medicine

## 2016-08-25 ENCOUNTER — Ambulatory Visit (INDEPENDENT_AMBULATORY_CARE_PROVIDER_SITE_OTHER): Payer: Medicare Other | Admitting: Family Medicine

## 2016-08-25 DIAGNOSIS — B9789 Other viral agents as the cause of diseases classified elsewhere: Secondary | ICD-10-CM

## 2016-08-25 DIAGNOSIS — J069 Acute upper respiratory infection, unspecified: Secondary | ICD-10-CM | POA: Insufficient documentation

## 2016-08-25 DIAGNOSIS — J301 Allergic rhinitis due to pollen: Secondary | ICD-10-CM

## 2016-08-25 DIAGNOSIS — E038 Other specified hypothyroidism: Secondary | ICD-10-CM | POA: Diagnosis not present

## 2016-08-25 NOTE — Patient Instructions (Signed)
Start levoceterizine at bedtime and flonase 2 sprays per nostril daily.  Continue mucinex as needed.  Should continue to see improvement in xt 4-5 days.. If getting worse , new fever, shortness breath.

## 2016-08-25 NOTE — Assessment & Plan Note (Signed)
symptomatic care. 

## 2016-08-25 NOTE — Assessment & Plan Note (Signed)
Add antihistamine.

## 2016-08-25 NOTE — Progress Notes (Signed)
   Subjective:    Patient ID: Susan Woods, female    DOB: May 02, 1945, 71 y.o.   MRN: [id number]  Sinusitis  This is a new problem. The current episode started in the past 7 days. The problem has been gradually improving since onset. There has been no fever. The pain is mild. Associated symptoms include chills, congestion, coughing, headaches, sinus pressure and sneezing. Pertinent negatives include no ear pain, shortness of breath, sore throat or swollen glands. (Body aches) Treatments tried: coricidin HBP, mucine. The treatment provided mild relief.   Hx of allergies   Review of Systems  Constitutional: Positive for chills.  HENT: Positive for congestion, sinus pressure and sneezing. Negative for ear pain and sore throat.   Respiratory: Positive for cough. Negative for shortness of breath.   Neurological: Positive for headaches.       Objective:   Physical Exam  Constitutional: Vital signs are normal. She appears well-developed and well-nourished. She is cooperative.  Non-toxic appearance. She does not appear ill. No distress.  HENT:  Head: Normocephalic.  Right Ear: Hearing, tympanic membrane, external ear and ear canal normal. Tympanic membrane is not erythematous, not retracted and not bulging.  Left Ear: Hearing, tympanic membrane, external ear and ear canal normal. Tympanic membrane is not erythematous, not retracted and not bulging.  Nose: Mucosal edema and rhinorrhea present. Right sinus exhibits no maxillary sinus tenderness and no frontal sinus tenderness. Left sinus exhibits no maxillary sinus tenderness and no frontal sinus tenderness.  Mouth/Throat: Uvula is midline, oropharynx is clear and moist and mucous membranes are normal.  Eyes: Conjunctivae, EOM and lids are normal. Pupils are equal, round, and reactive to light. Lids are everted and swept, no foreign bodies found.  Neck: Trachea normal and normal range of motion. Neck supple. Carotid bruit is not  present. No thyroid mass and no thyromegaly present.  Cardiovascular: Normal rate, regular rhythm, S1 normal, S2 normal, normal heart sounds, intact distal pulses and normal pulses.  Exam reveals no gallop and no friction rub.   No murmur heard. Pulmonary/Chest: Effort normal and breath sounds normal. No tachypnea. No respiratory distress. She has no decreased breath sounds. She has no wheezes. She has no rhonchi. She has no rales.  Neurological: She is alert.  Skin: Skin is warm, dry and intact. No rash noted.  Psychiatric: Her speech is normal and behavior is normal. Judgment normal. Her mood appears not anxious. Cognition and memory are normal. She does not exhibit a depressed mood.          Assessment & Plan:

## 2016-08-30 ENCOUNTER — Ambulatory Visit: Payer: Medicare Other | Admitting: Family Medicine

## 2016-10-10 ENCOUNTER — Telehealth: Payer: Self-pay

## 2016-10-10 MED ORDER — LOSARTAN POTASSIUM 100 MG PO TABS: 100.0000 mg | ORAL_TABLET | Freq: Every day | ORAL | 11 refills | Status: DC

## 2016-10-10 NOTE — Telephone Encounter (Signed)
Pt aware of shapale's message pt had other questions about another med Best number [phone]

## 2016-10-10 NOTE — Telephone Encounter (Signed)
Left voicemail letting pt know to stop the amlodipine and start the losartan, I advise pt on the message of Dr. Marliss Coots comments. Allergy list updated

## 2016-10-10 NOTE — Telephone Encounter (Signed)
Sent losartan Stop amlodipine and list on med intolerance record please   Update me if any side effects or problems or if she feels dizzy from low bp

## 2016-10-10 NOTE — Telephone Encounter (Signed)
Pt last seen 06/27/16; pt has changed socks with no relief of ankles swelling; when pt goes to bed at night and keeps feet up; in the morning ankles are not swollen but as day goes on ankles get puffy and swell. pts sister was on amlodipine and had similar problem; pts sister changed to losartan and now does not have ankle swelling. Pt request Dr Glori Bickers to consider changing amlodipine to different med. Pt request cb. [facility].

## 2016-10-10 NOTE — Telephone Encounter (Signed)
Pt just wanted to make sure she understood correctly to stop the amlodipine and start the losartan and to keep taking the metoprolol she is still on

## 2016-10-23 ENCOUNTER — Telehealth: Payer: Self-pay

## 2016-10-23 MED ORDER — HYDROCHLOROTHIAZIDE 25 MG PO TABS: 25.0000 mg | ORAL_TABLET | Freq: Every day | ORAL | 3 refills | Status: DC

## 2016-10-23 NOTE — Telephone Encounter (Signed)
I looked at her intolerance list - I would ordinarily add a ca channel blocker but they make her swell  So - please continue the losartan and take the hctz with it  If bp is not improved OR if headache continues even with better bp then we will change the losartan to a different brand   Keep me posted  Thanks

## 2016-10-23 NOTE — Telephone Encounter (Signed)
She is on the top dose of it  I would like to add hctz 25 mg daily to help reduce bp further if agreeable (is works well with this)  If she has had allergy to it or K or Na problems with it in the past let me know  Please call in 30 with 3 refills  F/u in about 2 weeks and we will do labs that day  Thanks

## 2016-10-23 NOTE — Telephone Encounter (Signed)
Pt was changed to Losartan about a week ago. She said she has had constant headaches and her blood pressure has been elevated. She is asking what she needs to do. Please advise.

## 2016-10-23 NOTE — Telephone Encounter (Signed)
Spoke to pt and she is concerned that the HA might be a side eff of the losartan not a side eff of her hight BP but she isn't sure. Pt said she didn't have a HA all day yesterday until she took her med., and then developed a HA soon after that. Pt said she hasn't taken her med today and hasn't had a HA today. Pt has been laying down taking it easy today also but she said her BP has been elevated since she had a root canal last week. Yesterday BP was 177/94 and she hasn't checked it today. Pt said she thought her HA was due to her dental procedure but that has healed and she is still having high BP reading and HA. Pt wanted to see if you still wanted her to keep taking the losartan or change med., also if she needs to start the HCTZ, she hasn't had any issues with her K or Na in the past so she is okay taking the HCTZ but she didn't know if you wanted to change med or not. Rx for HCTZ not sent until Dr. Glori Bickers addressed pt's concerns.

## 2016-10-23 NOTE — Telephone Encounter (Signed)
Pt notified Rx sent and advise of Dr. Marliss Coots instructions and recommendations and verbalized understanding. F/u appt scheduled

## 2016-10-25 NOTE — Telephone Encounter (Signed)
**  Received fax from pharmacy requesting clarification if pt should really be on HCTZ. It just says pt is allergic sulfa drugs and should she be given HCTZ?, please advise

## 2016-10-25 NOTE — Telephone Encounter (Signed)
Left voicemail letting pharmacy know Dr. Marliss Coots comments

## 2016-10-25 NOTE — Telephone Encounter (Signed)
I do not think she will have problems with it - if she has any side effects-stop it and alert [place]

## 2016-10-28 ENCOUNTER — Encounter: Payer: Self-pay | Admitting: Emergency Medicine

## 2016-10-28 ENCOUNTER — Emergency Department
Admission: EM | Admit: 2016-10-28 | Discharge: 2016-10-28 | Disposition: A | Discharge status: Home / Self Care | Payer: Medicare Other | Attending: Emergency Medicine | Admitting: Emergency Medicine

## 2016-10-28 DIAGNOSIS — I10 Essential (primary) hypertension: Secondary | ICD-10-CM | POA: Insufficient documentation

## 2016-10-28 DIAGNOSIS — Z87891 Personal history of nicotine dependence: Secondary | ICD-10-CM | POA: Diagnosis not present

## 2016-10-28 DIAGNOSIS — Z79899 Other long term (current) drug therapy: Secondary | ICD-10-CM | POA: Insufficient documentation

## 2016-10-28 LAB — CBC
HEMATOCRIT: 37.4 % (ref 35.0–47.0)
HEMOGLOBIN: 12.6 g/dL (ref 12.0–16.0)
MCH: 28.7 pg (ref 26.0–34.0)
MCHC: 33.6 g/dL (ref 32.0–36.0)
MCV: 85.5 fL (ref 80.0–100.0)
Platelets: 225 10*3/uL (ref 150–440)
RBC: 4.38 MIL/uL (ref 3.80–5.20)
RDW: 14.5 % (ref 11.5–14.5)
WBC: 7.2 10*3/uL (ref 3.6–11.0)

## 2016-10-28 LAB — BASIC METABOLIC PANEL
Anion gap: 9 (ref 5–15)
BUN: 8 mg/dL (ref 6–20)
CO2: 30 mmol/L (ref 22–32)
Calcium: 9.5 mg/dL (ref 8.9–10.3)
Chloride: 102 mmol/L (ref 101–111)
Creatinine, Ser: 0.81 mg/dL (ref 0.44–1.00)
GFR calc Af Amer: >60 mL/min (ref >60)
GFR calc non Af Amer: >60 mL/min (ref >60)
Glucose, Bld: 119 mg/dL — ABNORMAL HIGH (ref 65–99)
Potassium: 4.1 mmol/L (ref 3.5–5.1)
Sodium: 141 mmol/L (ref 135–145)

## 2016-10-28 LAB — TROPONIN I: Troponin I: <0.03 ng/mL (ref <0.03)

## 2016-10-28 NOTE — ED Provider Notes (Signed)
[facility] Emergency Department Provider Note   ____________________________________________    I have reviewed the triage vital signs and the nursing notes.   HISTORY  Chief Complaint Hypertension     HPI Susan Woods is a 71 y.o. female who presents with complaints of high blood pressure. Patient reports her doctor recently stopped her amlodipine because she was having swelling in her feet. She has added on hydrochlorothiazide and started this yesterday. She is at 25 mg but is only had 2 doses. She reports today she had a mild headache so decided check her blood pressure and found to be elevated, she then checked again and found to be even more elevated. This made her anxious and she decided to come to the emergency department. She denies neuro deficits. No chest pain. No change in vision. She reports her headache feels better, she did not take anything for it.   Past Medical History:  Diagnosis Date  . Anemia   . GERD (gastroesophageal reflux disease)   . HTN (hypertension)    echo (8/11): EF 55%, mild MR, mild TR  . Meningioma ([place])    small; advised no f/u unless symptomatic by Dr. Loletta Specter 8/11  . Nasal pruritis    full body w/o rash   . Nonallergic rhinitis   . Osteoporosis   . Psoriasis    mild intermittent   . Tingling    in hands of ? etiol    Patient Active Problem List   Diagnosis Date Noted  . Viral URI with cough 08/25/2016  . Burning sensation of feet 06/27/2016  . Internal hemorrhoids 04/27/2016  . Bilateral thigh pain 04/27/2016  . Abnormal thyroid blood test 01/13/2016  . Allergic reaction 06/22/2015  . 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 Colon cancer screening 12/22/2014  . Joint pain 08/26/2014  . Low back pain 05/18/2014  . Hypertensive retinopathy of both eyes, grade 1 12/16/2013  . Encounter for Medicare annual wellness exam 12/31/2012  . Urethritis 06/07/2012  . Routine general medical examination at a  health care facility 02/20/2011  . PRURITUS 04/18/2010  . MENINGIOMA 11/17/2009  . TIA 11/17/2009  . Osteoporosis 03/19/2007  . HYPOGLYCEMIA 06/12/2006  . ANEMIA 06/12/2006  . Essential hypertension 06/12/2006  . Allergic rhinitis 06/12/2006  . GERD 06/12/2006    Past Surgical History:  Procedure Laterality Date  . 2D echo  8/11   nml   . ABDOMINAL HYSTERECTOMY    . ABDOMINAL SURGERY     adhesions  . CESAREAN SECTION    . COLONOSCOPY  2006  . dexa - osteopenia  2005   dexa 2009 - osteoporosis/fairly stable   . hosp TIA  8/11  . NASAL SINUS SURGERY  2002  . OOPHORECTOMY    . ORIF FINGER / THUMB FRACTURE  12/06  . small meningioma    . urethral stricutre     dilated  . VESICOVAGINAL FISTULA CLOSURE W/ TAH      Prior to Admission medications   Medication Sig Start Date End Date Taking? Authorizing Provider  Calcium Carb-Cholecalciferol (CALCIUM 600 + D PO) Take 1 tablet by mouth daily.     [provider]  Cholecalciferol (VITAMIN D3) 1000 UNITS CAPS Take 1 capsule by mouth daily.      [provider]  cyclobenzaprine (FLEXERIL) 10 MG tablet Take 0.5-1 tablets (5-10 mg total) by mouth 3 (three) times daily as needed for muscle spasms (watch for sedation). 08/14/16   Tower, Wynelle Fanny, MD  Ferrous Sulfate (FE-CAPS) 250 MG CPCR Take 1 capsule by mouth daily.      [provider]  fish oil-omega-3 fatty acids 1000 MG capsule Take 2 g by mouth 2 (two) times daily. Alternate with flax oil    [provider]  fluticasone (FLONASE) 50 MCG/ACT nasal spray Place 2 sprays into both nostrils daily. 01/10/16   Tower, Wynelle Fanny, MD  hydrochlorothiazide (HYDRODIURIL) 25 MG tablet Take 1 tablet (25 mg total) by mouth daily. 10/23/16   Tower, Wynelle Fanny, MD  hydrocortisone (ANUSOL-HC) 2.5 % rectal cream Place 1 application rectally at bedtime. 03/17/16   Tower, Wynelle Fanny, MD  levocetirizine Harlow Ohms) 5 MG tablet  08/11/16   [provider]  levothyroxine (SYNTHROID,  LEVOTHROID) 50 MCG tablet Take by mouth. 03/03/16 03/03/17  [provider]  losartan (COZAAR) 100 MG tablet Take 1 tablet (100 mg total) by mouth daily. 10/10/16   Tower, Wynelle Fanny, MD  metoprolol succinate (TOPROL-XL) 100 MG 24 hr tablet Take 1 tablet by mouth  daily with or immediately  following a meal 01/10/16   Tower, [place] A, MD  Multiple Vitamin (MULTIVITAMIN) tablet Take 1 tablet by mouth daily.      [provider]  NON FORMULARY similisan dry eye drops and ear drops. UAD PRN     [provider]     Allergies Amlodipine; Indomethacin; Levaquin [levofloxacin in d5w]; Penicillins; Sulfonamide derivatives; Zithromax [azithromycin]; and Povidone-iodine  Family History  Problem Relation Age of Onset  . Cancer Father        throat CA  . Diabetes Father   . Heart disease Mother        MI late 58s - CHF   . Hypertension Mother   . Stroke Brother   . Diabetes Brother   . Heart disease Sister        CHF  . Diabetes Sister   . Hypertension Brother   . Diabetes Brother     Social History Social History  Substance Use Topics  . Smoking status: Former Smoker    Quit date: 04/08/1987  . Smokeless tobacco: Never Used  . Alcohol use 0.0 oz/week     Comment: rarely    Review of Systems  Constitutional: No fever/chills Eyes: No visual changes.  ENT: No sore throat. Cardiovascular: Denies chest pain. Respiratory: Denies shortness of breath. Gastrointestinal: No abdominal pain.  No nausea, no vomiting.   Genitourinary: Negative for dysuria. Musculoskeletal: Negative for back pain. Skin: Negative for rash. Neurological:As above   ____________________________________________   PHYSICAL EXAM:  VITAL SIGNS: ED Triage Vitals  Enc Vitals Group     BP 10/28/16 1925 (!) 205/91     Pulse Rate 10/28/16 1925 66     Resp 10/28/16 1925 18     Temp 10/28/16 1925 97.9 F (36.6 C)     Temp Source 10/28/16 1925 Oral     SpO2 10/28/16 1925 100 %     Weight  10/28/16 1925 68 kg (150 lb)     Height 10/28/16 1925 1.651 m (5\' 5" )     Head Circumference --      Peak Flow --      Pain Score 10/28/16 1924 4     Pain Loc --      Pain Edu? --      Excl. in [place]? --     Constitutional: Alert and oriented. No acute distress. Pleasant and interactive Eyes: Conjunctivae are normal.PERRLA  Head: Atraumatic. Nose: No congestion/rhinnorhea. Mouth/Throat:  Mucous membranes are moist.   Neck:  Painless ROM Cardiovascular: Normal rate, regular rhythm. Grossly normal heart sounds.  Good peripheral circulation. Respiratory: Normal respiratory effort.  No retractions. Lungs CTAB. Gastrointestinal: Soft and nontender. No distention.  No CVA tenderness. Genitourinary: deferred Musculoskeletal: No lower extremity tenderness nor edema.  Warm and well perfused Neurologic:  Normal speech and language. No gross focal neurologic deficits are appreciated.  Skin:  Skin is warm, dry and intact. No rash noted. Psychiatric: Mood and affect are normal. Speech and behavior are normal.  ____________________________________________   LABS (all labs ordered are listed, but only abnormal results are displayed)  Labs Reviewed  BASIC METABOLIC PANEL - Abnormal; Notable for the following:       Result Value   Glucose, Bld 119 (*)    All other components within normal limits  CBC  TROPONIN I   ____________________________________________  EKG  ED ECG REPORT I, Lavonia Drafts, the attending physician, personally viewed and interpreted this ECG.  Date: 10/28/2016  Rate: 66 Rhythm: normal sinus rhythm QRS Axis: normal Intervals: normal ST/T Wave abnormalities: normal le  ____________________________________________  RADIOLOGY  None ____________________________________________   PROCEDURES  Procedure(s) performed: No    Critical Care performed: No ____________________________________________   INITIAL IMPRESSION / ASSESSMENT AND PLAN / ED  COURSE  Pertinent labs & imaging results that were available during my care of the patient were reviewed by me and considered in my medical decision making (see chart for details).  Patient well-appearing and in no acute distress. Exam is unremarkable. Lab work is reassuring. Blood pressure has improved to 060 systolic without intervention. Discussed with patient that I suspect continuing the hydrochlorothiazide at the current dose is the most appropriate thing given that she has only had a couple of doses. She does have close follow-up and her PCP is managing her blood pressure closely. She is symptom-free at this time is not clear to me that her blood pressure was as possible for her headache. I recommended trying Tylenol or Motrin for headaches in the future    ____________________________________________   FINAL CLINICAL IMPRESSION(S) / ED DIAGNOSES  Final diagnoses:  Hypertension, unspecified type      NEW MEDICATIONS STARTED DURING THIS VISIT:  Discharge Medication List as of 10/28/2016  9:22 PM       Note:  This document was prepared using Dragon voice recognition software and may include unintentional dictation errors.    Lavonia Drafts, MD 10/28/16 [phone]

## 2016-10-28 NOTE — ED Triage Notes (Signed)
Patient states that she has been hypertensive and headache that started today. Patient states that her systolic blood pressure was 205 at home. Patient states that about 2 weeks ago her blood pressure was changed from amlodipine to losartan and hctz.

## 2016-11-04 ENCOUNTER — Other Ambulatory Visit: Payer: Self-pay | Admitting: Family Medicine

## 2016-11-06 ENCOUNTER — Encounter: Payer: Self-pay | Admitting: Family Medicine

## 2016-11-06 ENCOUNTER — Ambulatory Visit (INDEPENDENT_AMBULATORY_CARE_PROVIDER_SITE_OTHER): Payer: Medicare Other | Admitting: Family Medicine

## 2016-11-06 VITALS — BP 142/88 | HR 86 | Temp 98.0°F | Ht 64.0 in | Wt 151.0 lb

## 2016-11-06 DIAGNOSIS — I10 Essential (primary) hypertension: Secondary | ICD-10-CM | POA: Diagnosis not present

## 2016-11-06 LAB — BASIC METABOLIC PANEL
BUN: 8 mg/dL (ref 6–23)
CALCIUM: 9.6 mg/dL (ref 8.4–10.5)
CO2: 32 meq/L (ref 19–32)
CREATININE: 0.72 mg/dL (ref 0.40–1.20)
Chloride: 103 mEq/L (ref 96–112)
GFR: 103.27 mL/min (ref >60.00)
GLUCOSE: 97 mg/dL (ref 70–99)
Potassium: 3.9 mEq/L (ref 3.5–5.1)
SODIUM: 140 meq/L (ref 135–145)

## 2016-11-06 MED ORDER — HYDROCHLOROTHIAZIDE 25 MG PO TABS: 25.0000 mg | ORAL_TABLET | Freq: Every day | ORAL | 3 refills | Status: DC

## 2016-11-06 MED ORDER — METOPROLOL SUCCINATE ER 100 MG PO TB24: ORAL_TABLET | ORAL | 3 refills | Status: DC

## 2016-11-06 MED ORDER — LOSARTAN POTASSIUM 100 MG PO TABS: 100.0000 mg | ORAL_TABLET | Freq: Every day | ORAL | 3 refills | Status: DC

## 2016-11-06 NOTE — Progress Notes (Signed)
Subjective:    Patient ID: Susan Woods, female    DOB: 25-Jun-1945, 71 y.o.   MRN: [id number]  HPI Here for f/u of ED visit on 8/18  She was seen for HTN  Her amlodipine was prev stopped due to edema and changed to hctz bp went up significantly causing headaches EKG was nl with rate of 66 Lab Results  Component Value Date   CREATININE 0.81 10/28/2016   BUN 8 10/28/2016   NA 141 10/28/2016   K 4.1 10/28/2016   CL 102 10/28/2016   CO2 30 10/28/2016   Lab Results  Component Value Date   WBC 7.2 10/28/2016   HGB 12.6 10/28/2016   HCT 37.4 10/28/2016   MCV 85.5 10/28/2016   PLT 225 10/28/2016    In the ED bp went down to 637 systolic w/o intervention  BP Readings from Last 3 Encounters:  11/06/16 (!) 146/88  10/28/16 (!) 187/97  08/25/16 140/84   Today 146/88 Re check BP: (!) 142/88      Wt Readings from Last 3 Encounters:  11/06/16 151 lb (68.5 kg)  10/28/16 150 lb (68 kg)  08/25/16 154 lb (69.9 kg)  doing very well with diet and exercise  In a better routine also  Staying away from salty and processed foods      Pulse Readings from Last 3 Encounters:  11/06/16 86  10/28/16 (!) 59  08/25/16 64     In the past amlodipine caused ankle swelling  lisinopiril itching   On losartan and also toprol and hctz    She is feeling much better  occ headache/small   Patient Active Problem List   Diagnosis Date Noted  . Burning sensation of feet 06/27/2016  . Internal hemorrhoids 04/27/2016  . Bilateral thigh pain 04/27/2016  . Abnormal thyroid blood test 01/13/2016  . Allergic reaction 06/22/2015  . 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 Colon cancer screening 12/22/2014  . Joint pain 08/26/2014  . Low back pain 05/18/2014  . Hypertensive retinopathy of both eyes, grade 1 12/16/2013  . Encounter for Medicare annual wellness exam 12/31/2012  . Urethritis 06/07/2012  . Routine general medical examination at a health care facility 02/20/2011  . PRURITUS  04/18/2010  . MENINGIOMA 11/17/2009  . TIA 11/17/2009  . Osteoporosis 03/19/2007  . HYPOGLYCEMIA 06/12/2006  . ANEMIA 06/12/2006  . Essential hypertension 06/12/2006  . Allergic rhinitis 06/12/2006  . GERD 06/12/2006   Past Medical History:  Diagnosis Date  . Anemia   . GERD (gastroesophageal reflux disease)   . HTN (hypertension)    echo (8/11): EF 55%, mild MR, mild TR  . Meningioma ([place])    small; advised no f/u unless symptomatic by Dr. Loletta Specter 8/11  . Nasal pruritis    full body w/o rash   . Nonallergic rhinitis   . Osteoporosis   . Psoriasis    mild intermittent   . Tingling    in hands of ? etiol   Past Surgical History:  Procedure Laterality Date  . 2D echo  8/11   nml   . ABDOMINAL HYSTERECTOMY    . ABDOMINAL SURGERY     adhesions  . CESAREAN SECTION    . COLONOSCOPY  2006  . dexa - osteopenia  2005   dexa 2009 - osteoporosis/fairly stable   . hosp TIA  8/11  . NASAL SINUS SURGERY  2002  . OOPHORECTOMY    . ORIF FINGER / THUMB FRACTURE  12/06  . small meningioma    .  urethral stricutre     dilated  . VESICOVAGINAL FISTULA CLOSURE W/ TAH     Social History  Substance Use Topics  . Smoking status: Former Smoker    Quit date: 04/08/1987  . Smokeless tobacco: Never Used  . Alcohol use 0.0 oz/week     Comment: rarely   Family History  Problem Relation Age of Onset  . Cancer Father        throat CA  . Diabetes Father   . Heart disease Mother        MI late 64s - CHF   . Hypertension Mother   . Stroke Brother   . Diabetes Brother   . Heart disease Sister        CHF  . Diabetes Sister   . Hypertension Brother   . Diabetes Brother    Allergies  Allergen Reactions  . Amlodipine Swelling    Ankle swelling  . Indomethacin     REACTION: swelling, rash  . Levaquin [Levofloxacin In D5w] Other (See Comments)    Muscle pain   . Penicillins     REACTION: urticaria (hives)  . Sulfonamide Derivatives     REACTION: tingling around mouth and hands  felt tight  . Zithromax [Azithromycin]   . Povidone-Iodine Hives and Itching   Current Outpatient Prescriptions on File Prior to Visit  Medication Sig Dispense Refill  . Calcium Carb-Cholecalciferol (CALCIUM 600 + D PO) Take 1 tablet by mouth daily.     . Cholecalciferol (VITAMIN D3) 1000 UNITS CAPS Take 1 capsule by mouth daily.      . cyclobenzaprine (FLEXERIL) 10 MG tablet Take 0.5-1 tablets (5-10 mg total) by mouth 3 (three) times daily as needed for muscle spasms (watch for sedation). 30 tablet 0  . Ferrous Sulfate (FE-CAPS) 250 MG CPCR Take 1 capsule by mouth daily.      . fish oil-omega-3 fatty acids 1000 MG capsule Take 2 g by mouth 2 (two) times daily. Alternate with flax oil    . fluticasone (FLONASE) 50 MCG/ACT nasal spray Place 2 sprays into both nostrils daily. 48 g 3  . hydrocortisone (ANUSOL-HC) 2.5 % rectal cream Place 1 application rectally at bedtime. 30 g 1  . levocetirizine (XYZAL) 5 MG tablet     . levothyroxine (SYNTHROID, LEVOTHROID) 50 MCG tablet Take by mouth.    . Multiple Vitamin (MULTIVITAMIN) tablet Take 1 tablet by mouth daily.      . NON FORMULARY similisan dry eye drops and ear drops. UAD PRN      No current facility-administered medications on file prior to visit.     Review of Systems    Review of Systems  Constitutional: Negative for fever, appetite change, fatigue and unexpected weight change.  Eyes: Negative for pain and visual disturbance.  ENt pos for nasal allergy symptoms with occ ha  Respiratory: Negative for cough and shortness of breath.   Cardiovascular: Negative for cp or palpitations    Gastrointestinal: Negative for nausea, diarrhea and constipation.  Genitourinary: Negative for urgency and frequency.  Skin: Negative for pallor or rash   Neurological: Negative for weakness, light-headedness, numbness and pos for occ (improved) headaches.  Hematological: Negative for adenopathy. Does not bruise/bleed easily.  Psychiatric/Behavioral:  Negative for dysphoric mood. The patient is not nervous/anxious.      Objective:   Physical Exam  Constitutional: She appears well-developed and well-nourished. No distress.  Well appearing   HENT:  Head: Normocephalic and atraumatic.  Mouth/Throat: Oropharynx is clear and  moist.  Eyes: Pupils are equal, round, and reactive to light. Conjunctivae and EOM are normal.  Neck: Normal range of motion. Neck supple. No JVD present. Carotid bruit is not present. No thyromegaly present.  Cardiovascular: Normal rate, regular rhythm, normal heart sounds and intact distal pulses.  Exam reveals no gallop.   Pulmonary/Chest: Effort normal and breath sounds normal. No respiratory distress. She has no wheezes. She has no rales.  No crackles  Abdominal: Soft. Bowel sounds are normal. She exhibits no distension, no abdominal bruit and no mass. There is no tenderness.  Musculoskeletal: She exhibits no edema.  No pedal edema   Lymphadenopathy:    She has no cervical adenopathy.  Neurological: She is alert. She has normal reflexes.  Skin: Skin is warm and dry. No rash noted.  Psychiatric: She has a normal mood and affect.          Assessment & Plan:   Problem List Items Addressed This Visit      Cardiovascular and Mediastinum   Essential hypertension - Primary    bp rose after d/c of amlodipine (for ankle edema)  Replaced by hctz and some improvement now  Still not quite at goal/ pt wants to hold off on adding anything else On losartan and beta blocker  ? Could consider lower dose amlodipine if needed in the future  Urged to continue good diet/exercise habits  bmet today      Relevant Medications   losartan (COZAAR) 100 MG tablet   metoprolol succinate (TOPROL-XL) 100 MG 24 hr tablet   hydrochlorothiazide (HYDRODIURIL) 25 MG tablet   Other Relevant Orders   Basic metabolic panel

## 2016-11-06 NOTE — Patient Instructions (Signed)
Continue current medicines  BP is not quite to goal but improving  Continue good diet and exercise  Labs today

## 2016-11-06 NOTE — Assessment & Plan Note (Signed)
bp rose after d/c of amlodipine (for ankle edema)  Replaced by hctz and some improvement now  Still not quite at goal/ pt wants to hold off on adding anything else On losartan and beta blocker  ? Could consider lower dose amlodipine if needed in the future  Urged to continue good diet/exercise habits  bmet today

## 2016-12-08 DIAGNOSIS — E038 Other specified hypothyroidism: Secondary | ICD-10-CM | POA: Diagnosis not present

## 2017-01-05 DIAGNOSIS — H35372 Puckering of macula, left eye: Secondary | ICD-10-CM | POA: Diagnosis not present

## 2017-01-05 DIAGNOSIS — H2513 Age-related nuclear cataract, bilateral: Secondary | ICD-10-CM | POA: Diagnosis not present

## 2017-01-05 DIAGNOSIS — H04123 Dry eye syndrome of bilateral lacrimal glands: Secondary | ICD-10-CM | POA: Diagnosis not present

## 2017-01-05 DIAGNOSIS — H16223 Keratoconjunctivitis sicca, not specified as Sjogren's, bilateral: Secondary | ICD-10-CM | POA: Diagnosis not present

## 2017-01-05 DIAGNOSIS — H524 Presbyopia: Secondary | ICD-10-CM | POA: Diagnosis not present

## 2017-01-05 DIAGNOSIS — H33102 Unspecified retinoschisis, left eye: Secondary | ICD-10-CM | POA: Diagnosis not present

## 2017-01-08 ENCOUNTER — Encounter: Payer: Self-pay | Admitting: Family Medicine

## 2017-01-08 ENCOUNTER — Ambulatory Visit (INDEPENDENT_AMBULATORY_CARE_PROVIDER_SITE_OTHER): Payer: Medicare Other | Admitting: Family Medicine

## 2017-01-08 VITALS — BP 140/90 | HR 61 | Temp 98.0°F | Ht 64.25 in | Wt 149.8 lb

## 2017-01-08 DIAGNOSIS — Z0000 Encounter for general adult medical examination without abnormal findings: Secondary | ICD-10-CM

## 2017-01-08 DIAGNOSIS — M810 Age-related osteoporosis without current pathological fracture: Secondary | ICD-10-CM | POA: Diagnosis not present

## 2017-01-08 DIAGNOSIS — R922 Inconclusive mammogram: Secondary | ICD-10-CM | POA: Diagnosis not present

## 2017-01-08 DIAGNOSIS — I10 Essential (primary) hypertension: Secondary | ICD-10-CM | POA: Diagnosis not present

## 2017-01-08 DIAGNOSIS — Z1231 Encounter for screening mammogram for malignant neoplasm of breast: Secondary | ICD-10-CM | POA: Diagnosis not present

## 2017-01-08 DIAGNOSIS — E039 Hypothyroidism, unspecified: Secondary | ICD-10-CM

## 2017-01-08 MED ORDER — AMLODIPINE BESYLATE 2.5 MG PO TABS: 2.5000 mg | ORAL_TABLET | Freq: Every day | ORAL | 11 refills | Status: DC

## 2017-01-08 NOTE — Assessment & Plan Note (Signed)
Declines further trials of tx or another dexa No falls or fx Intol to fosamax On ca and D  Exercises

## 2017-01-08 NOTE — Assessment & Plan Note (Signed)
On levothyroid from endocrinologist Clinically stable

## 2017-01-08 NOTE — Progress Notes (Signed)
Subjective:    Patient ID: Susan Woods, female    DOB: [birth year], 71 y.o.   MRN: [id number]  HPI  I have personally reviewed the Medicare Annual Wellness questionnaire and have noted 1. The patient's medical and social history 2. Their use of alcohol, tobacco or illicit drugs 3. Their current medications and supplements 4. The patient's functional ability including ADL's, fall risks, home safety risks and hearing or visual             impairment. 5. Diet and physical activities 6. Evidence for depression or mood disorders  The patients weight, height, BMI have been recorded in the chart and visual acuity is per eye clinic.  I have made referrals, counseling and provided education to the patient based review of the above and I have provided the pt with a written personalized care plan for preventive services. Reviewed and updated provider list, see scanned forms.  See scanned forms.  Routine anticipatory guidance given to patient.  See health maintenance. 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 - no memory problems (I remember too much)    PMH and SH reviewed  Meds, vitals, and allergies reviewed.   ROS: See HPI.  Otherwise negative.     Wt Readings from Last 3 Encounters:  01/08/17 149 lb 12 oz (67.9 kg)  11/06/16 151 lb (68.5 kg)  10/28/16 150 lb (68 kg)  stable- well controlled  25.50 kg/m  HTN Hist of TIA  and hypertensive retinopathy  No cp or palpitations or headaches or edema  No side effects to medicines  BP Readings from Last 3 Encounters:  01/08/17 (!) 144/94  11/06/16 (!) 142/88  10/28/16 (!) 187/97    At home 130/79  bp is high today  prev d/c amlodipine for ankle edema and repl by hctz  (on it for 3 mo)  On arb and beta blocker   Lab Results  Component Value Date   TSH 0.34 (L) 01/10/2016   saw thyroid doctor - will go back dec 2019 Still taking levothyroxine  Dr Rozann Lesches    Lab Results  Component Value Date   WBC 7.2 10/28/2016   HGB 12.6 10/28/2016   HCT 37.4 10/28/2016   MCV 85.5 10/28/2016   PLT 225 10/28/2016   Due for labs    Hearing Screening   125Hz  250Hz  500Hz  1000Hz  2000Hz  3000Hz  4000Hz  6000Hz  8000Hz   Right ear:   40 40 40  40    Left ear:   40 40 40  40    Vision Screening Comments: Pt had eye exam at  [facility] on 01/05/17  Cholesterol Lab Results  Component Value Date   CHOL 147 01/10/2016   HDL 53.00 01/10/2016   LDLCALC 80 01/10/2016   TRIG 72.0 01/10/2016   CHOLHDL 3 01/10/2016   Watches diet carefully   Patient Active Problem List   Diagnosis Date Noted  . Internal hemorrhoids 04/27/2016  . Hypothyroid  01/13/2016  . Allergic reaction 06/22/2015  . 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 Colon cancer screening 12/22/2014  . Joint pain 08/26/2014  . Low back pain 05/18/2014  . Hypertensive retinopathy of both eyes, grade 1 12/16/2013  . Encounter for Medicare annual wellness exam 12/31/2012  . Urethritis 06/07/2012  . Routine general medical examination at a health care facility 02/20/2011  . PRURITUS 04/18/2010  . MENINGIOMA 11/17/2009  . TIA 11/17/2009  . Osteoporosis 03/19/2007  . HYPOGLYCEMIA 06/12/2006  . Essential hypertension 06/12/2006  . Allergic rhinitis 06/12/2006  . GERD 06/12/2006   Past Medical History:  Diagnosis Date  . Anemia   . GERD (gastroesophageal reflux disease)   . HTN (hypertension)    echo  (8/11): EF 55%, mild MR, mild TR  . Meningioma ([place])    small; advised no f/u unless symptomatic by Dr. Loletta Specter 8/11  . Nasal pruritis    full body w/o rash   . Nonallergic rhinitis   . Osteoporosis   . Psoriasis    mild intermittent   . Tingling    in hands of ? etiol   Past Surgical History:  Procedure Laterality Date  . 2D echo  8/11   nml   . ABDOMINAL HYSTERECTOMY    . ABDOMINAL SURGERY     adhesions  . CESAREAN SECTION    . COLONOSCOPY  2006  . dexa - osteopenia  2005   dexa 2009 - osteoporosis/fairly stable   . hosp TIA  8/11  . NASAL SINUS SURGERY  2002  . OOPHORECTOMY    . ORIF FINGER / THUMB FRACTURE  12/06  . small meningioma    . urethral stricutre     dilated  . VESICOVAGINAL FISTULA CLOSURE W/ TAH     Social History  Substance Use Topics  . Smoking status: Former Smoker    Quit date: 04/08/1987  . Smokeless tobacco: Never Used  . Alcohol use 0.0 oz/week     Comment: rarely   Family History  Problem Relation Age of Onset  . Cancer Father        throat CA  . Diabetes Father   . Heart disease Mother        MI late 66s - CHF   . Hypertension Mother   . Stroke Brother   . Diabetes Brother   . Heart disease Sister        CHF  . Diabetes Sister   . Hypertension Brother   . Diabetes Brother    Allergies  Allergen Reactions  . Amlodipine Swelling    Ankle swelling  . Indomethacin     REACTION: swelling, rash  . Levaquin [Levofloxacin In D5w] Other (See Comments)    Muscle pain   . Penicillins     REACTION: urticaria (hives)  . Sulfonamide Derivatives     REACTION: tingling around mouth and hands felt tight  . Zithromax [Azithromycin]   . Povidone-Iodine Hives and Itching   Current Outpatient Prescriptions on File Prior to Visit  Medication Sig Dispense Refill  . Calcium Carb-Cholecalciferol (CALCIUM 600 + D PO) Take 1 tablet by mouth daily.     . Cholecalciferol (VITAMIN D3) 1000 UNITS CAPS Take 1 capsule by mouth daily.      .  cyclobenzaprine (FLEXERIL) 10 MG tablet Take 0.5-1 tablets (5-10 mg total) by mouth 3 (three) times daily as needed for muscle spasms (watch for sedation). 30 tablet 0  . Ferrous Sulfate (FE-CAPS) 250 MG CPCR Take 1 capsule by mouth daily.      Marland Kitchen  fish oil-omega-3 fatty acids 1000 MG capsule Take 2 g by mouth 2 (two) times daily. Alternate with flax oil    . fluticasone (FLONASE) 50 MCG/ACT nasal spray Place 2 sprays into both nostrils daily. 48 g 3  . hydrochlorothiazide (HYDRODIURIL) 25 MG tablet Take 1 tablet (25 mg total) by mouth daily. 90 tablet 3  . hydrocortisone (ANUSOL-HC) 2.5 % rectal cream Place 1 application rectally at bedtime. 30 g 1  . levocetirizine (XYZAL) 5 MG tablet     . levothyroxine (SYNTHROID, LEVOTHROID) 50 MCG tablet Take by mouth.    . losartan (COZAAR) 100 MG tablet Take 1 tablet (100 mg total) by mouth daily. 90 tablet 3  . metoprolol succinate (TOPROL-XL) 100 MG 24 hr tablet Take 1 tablet by mouth  daily with or immediately  following a meal 90 tablet 3  . Multiple Vitamin (MULTIVITAMIN) tablet Take 1 tablet by mouth daily.      . NON FORMULARY similisan dry eye drops and ear drops. UAD PRN      No current facility-administered medications on file prior to visit.     Review of Systems  Constitutional: Negative for activity change, appetite change, fatigue, fever and unexpected weight change.  HENT: Negative for congestion, ear pain, rhinorrhea, sinus pressure and sore throat.   Eyes: Negative for pain, redness and visual disturbance.  Respiratory: Negative for cough, shortness of breath and wheezing.   Cardiovascular: Negative for chest pain and palpitations.  Gastrointestinal: Negative for abdominal pain, blood in stool, constipation and diarrhea.  Endocrine: Negative for polydipsia and polyuria.  Genitourinary: Negative for dysuria, frequency and urgency.  Musculoskeletal: Negative for arthralgias, back pain and myalgias.  Skin: Negative for pallor and rash.    Allergic/Immunologic: Negative for environmental allergies.  Neurological: Negative for dizziness, syncope and headaches.  Hematological: Negative for adenopathy. Does not bruise/bleed easily.  Psychiatric/Behavioral: Negative for decreased concentration and dysphoric mood. The patient is not nervous/anxious.        Objective:   Physical Exam  Constitutional: She appears well-developed and well-nourished. No distress.  Well appearing   HENT:  Head: Normocephalic and atraumatic.  Right Ear: External ear normal.  Left Ear: External ear normal.  Mouth/Throat: Oropharynx is clear and moist.  Eyes: Pupils are equal, round, and reactive to light. Conjunctivae and EOM are normal. No scleral icterus.  Neck: Normal range of motion. Neck supple. No JVD present. Carotid bruit is not present. No thyromegaly present.  Cardiovascular: Normal rate, regular rhythm, normal heart sounds and intact distal pulses.  Exam reveals no gallop.   Pulmonary/Chest: Effort normal and breath sounds normal. No respiratory distress. She has no wheezes. She exhibits no tenderness.  Abdominal: Soft. Bowel sounds are normal. She exhibits no distension, no abdominal bruit and no mass. There is no tenderness.  Genitourinary: No breast swelling, tenderness, discharge or bleeding.  Genitourinary Comments: Breast exam: No mass, nodules, thickening, tenderness, bulging, retraction, inflamation, nipple discharge or skin changes noted.  No axillary or clavicular LA.      Musculoskeletal: Normal range of motion. She exhibits no edema or tenderness.  Lymphadenopathy:    She has no cervical adenopathy.  Neurological: She is alert. She has normal reflexes. No cranial nerve deficit. She exhibits normal muscle tone. Coordination normal.  Skin: Skin is warm and dry. No rash noted. No erythema. No pallor.  Psychiatric: She has a normal mood and affect.          Assessment & Plan:   Problem List Items  Addressed This Visit       Cardiovascular and Mediastinum   Essential hypertension - Primary    bp in fair control at this time  BP Readings from Last 1 Encounters:  01/08/17 140/90   No changes needed Disc lifstyle change with low sodium diet and exercise  Will add 2.5 mg of amlodipine to get it more tightly controlled- hoping this low of a dose will not cause edema  Pt will keep eye on bp at home and update [place]       Relevant Medications   amLODipine (NORVASC) 2.5 MG tablet   Other Relevant Orders   CBC with Differential/Platelet   Comprehensive metabolic panel   Lipid panel   TSH     Endocrine   Hypothyroid    On levothyroid from endocrinologist Clinically stable        Musculoskeletal and Integument   Osteoporosis    Declines further trials of tx or another dexa No falls or fx Intol to fosamax On ca and D  Exercises         Other   Encounter for Medicare annual wellness exam    Reviewed health habits including diet and exercise and skin cancer prevention Reviewed appropriate screening tests for age  Also reviewed health mt list, fam hx and immunization status , as well as social and family history   See HPI Labs done today Declines dexa  Interested in shingrix when available  Will work on [employer] and get [place] a copy      Routine general medical examination at a health care facility    Reviewed health habits including diet and exercise and skin cancer prevention Reviewed appropriate screening tests for age  Also reviewed health mt list, fam hx and immunization status , as well as social and family history   See HPI Labs done today Declines dexa  Interested in shingrix when available  Will work on [employer] and get [place] a copy

## 2017-01-08 NOTE — Patient Instructions (Addendum)
Look at the blue booklet re: power of attorney  Get [place] a copy of that and living will when you are done   Blood pressure is still slightly high Add back 2.5 mg of amlodipine (you were on 10 mg)  Watch bp at home-alert me if not helpful or if bp is not improved   Keep up good diet and exercise   Labs today

## 2017-01-08 NOTE — Assessment & Plan Note (Signed)
Reviewed health habits including diet and exercise and skin cancer prevention Reviewed appropriate screening tests for age  Also reviewed health mt list, fam hx and immunization status , as well as social and family history   See HPI Labs done today Declines dexa  Interested in shingrix when available  Will work on [employer] and get [place] a copy

## 2017-01-08 NOTE — Assessment & Plan Note (Signed)
bp in fair control at this time  BP Readings from Last 1 Encounters:  01/08/17 140/90   No changes needed Disc lifstyle change with low sodium diet and exercise  Will add 2.5 mg of amlodipine to get it more tightly controlled- hoping this low of a dose will not cause edema  Pt will keep eye on bp at home and update [place]

## 2017-01-09 LAB — COMPREHENSIVE METABOLIC PANEL
ALT: 9 U/L (ref 0–35)
AST: 17 U/L (ref 0–37)
Albumin: 4.1 g/dL (ref 3.5–5.2)
Alkaline Phosphatase: 66 U/L (ref 39–117)
BUN: 10 mg/dL (ref 6–23)
CHLORIDE: 102 meq/L (ref 96–112)
CO2: 28 meq/L (ref 19–32)
Calcium: 9.8 mg/dL (ref 8.4–10.5)
Creatinine, Ser: 0.68 mg/dL (ref 0.40–1.20)
GFR: 110.25 mL/min (ref >60.00)
GLUCOSE: 86 mg/dL (ref 70–99)
POTASSIUM: 3.8 meq/L (ref 3.5–5.1)
SODIUM: 139 meq/L (ref 135–145)
Total Bilirubin: 0.4 mg/dL (ref 0.2–1.2)
Total Protein: 7.1 g/dL (ref 6.0–8.3)

## 2017-01-09 LAB — CBC WITH DIFFERENTIAL/PLATELET
BASOS ABS: 0.1 10*3/uL (ref 0.0–0.1)
BASOS PCT: 0.9 % (ref 0.0–3.0)
Eosinophils Absolute: 0.2 10*3/uL (ref 0.0–0.7)
Eosinophils Relative: 3.2 % (ref 0.0–5.0)
HCT: 36.3 % (ref 36.0–46.0)
Hemoglobin: 12.2 g/dL (ref 12.0–15.0)
Lymphocytes Relative: 47.3 % — ABNORMAL HIGH (ref 12.0–46.0)
Lymphs Abs: 2.9 10*3/uL (ref 0.7–4.0)
MCHC: 33.7 g/dL (ref 30.0–36.0)
MCV: 87.4 fl (ref 78.0–100.0)
MONOS PCT: 7 % (ref 3.0–12.0)
Monocytes Absolute: 0.4 10*3/uL (ref 0.1–1.0)
NEUTROS ABS: 2.5 10*3/uL (ref 1.4–7.7)
NEUTROS PCT: 41.6 % — AB (ref 43.0–77.0)
Platelets: 262 10*3/uL (ref 150.0–400.0)
RBC: 4.15 Mil/uL (ref 3.87–5.11)
RDW: 14.3 % (ref 11.5–15.5)
WBC: 6 10*3/uL (ref 4.0–10.5)

## 2017-01-09 LAB — LIPID PANEL
Cholesterol: 132 mg/dL (ref 0–200)
HDL: 44 mg/dL (ref >39.00)
LDL CALC: 72 mg/dL (ref 0–99)
NONHDL: 88.29
Total CHOL/HDL Ratio: 3
Triglycerides: 81 mg/dL (ref 0.0–149.0)
VLDL: 16.2 mg/dL (ref 0.0–40.0)

## 2017-01-09 LAB — TSH

## 2017-01-10 ENCOUNTER — Encounter: Payer: Medicare Other | Admitting: Family Medicine

## 2017-01-10 ENCOUNTER — Encounter: Payer: Self-pay | Admitting: Family Medicine

## 2017-01-10 ENCOUNTER — Ambulatory Visit: Payer: Medicare Other

## 2017-01-10 DIAGNOSIS — Z1231 Encounter for screening mammogram for malignant neoplasm of breast: Secondary | ICD-10-CM | POA: Diagnosis not present

## 2017-01-10 DIAGNOSIS — R922 Inconclusive mammogram: Secondary | ICD-10-CM | POA: Diagnosis not present

## 2017-01-23 DIAGNOSIS — H35372 Puckering of macula, left eye: Secondary | ICD-10-CM | POA: Diagnosis not present

## 2017-01-23 DIAGNOSIS — H33192 Other retinoschisis and retinal cysts, left eye: Secondary | ICD-10-CM | POA: Diagnosis not present

## 2017-01-23 DIAGNOSIS — H43813 Vitreous degeneration, bilateral: Secondary | ICD-10-CM | POA: Diagnosis not present

## 2017-01-23 LAB — HM DIABETES EYE EXAM

## 2017-01-25 ENCOUNTER — Telehealth: Payer: Self-pay | Admitting: Family Medicine

## 2017-01-25 MED ORDER — AMLODIPINE BESYLATE 2.5 MG PO TABS: 2.5000 mg | ORAL_TABLET | Freq: Every day | ORAL | 3 refills | Status: DC

## 2017-01-25 NOTE — Telephone Encounter (Signed)
Agent or nurse didn't document which BP med pt needs filled (she's on 4 different meds), called pt and she advise me it was the amlodipine, Rx sent

## 2017-01-25 NOTE — Telephone Encounter (Signed)
Patient wants her BP medication called to [facility]- it is in her chart- she gets her other medications there. Please send a 90 day supply.

## 2017-01-25 NOTE — Telephone Encounter (Signed)
Copied from [place] #[id number]. Topic: Quick Communication - See Telephone Encounter >> Jan 25, 2017 10:23 AM Arletha Grippe wrote: CRM for notification. See Telephone encounter for:   01/25/17. Pt check bp and it was 124/73 yesterday evening Pt would like new bp meds to be called into mail order pharmacy - [facility] Pt cb number if needed is  [phone]

## 2017-04-10 ENCOUNTER — Telehealth: Payer: Self-pay | Admitting: Family Medicine

## 2017-04-10 NOTE — Telephone Encounter (Signed)
Copied from [place] [id number]. Topic: Quick Communication - See Telephone Encounter >> Apr 10, 2017 12:49 PM Hewitt Shorts wrote: CRM for notification. See Telephone encounter for: pt is wanting to let Dr. Glori Bickers know that since she has started the amlodipine back the ankle burning has come back she states that the provider would know what she meant and that there is no swelling and she wants to  keep taking the amlodipine  04/10/17.

## 2017-04-10 NOTE — Telephone Encounter (Signed)
Thanks for letting me know =stay on it unless symptoms worsen or become intolerable

## 2017-05-07 ENCOUNTER — Other Ambulatory Visit: Payer: Self-pay | Admitting: Family Medicine

## 2017-05-08 NOTE — Telephone Encounter (Signed)
Will refill electronically  

## 2017-05-08 NOTE — Telephone Encounter (Signed)
Last filled on 08/14/16 #30 tabs with 0 refills, CPE is scheduled 01/11/18

## 2017-05-21 ENCOUNTER — Ambulatory Visit: Payer: Self-pay | Admitting: *Deleted

## 2017-05-21 NOTE — Telephone Encounter (Signed)
Patient is calling to request an appointment- she states she was sick last week - but she is doing better this week. She reports she still has some " heaviness" in her chest. She reports her cough is much bette. She has little to no congestion. She still has some sinus drainage- and is requesting an appointment to make sure she does not have any residue lung problems. Patient does not need acute visit- schedule for regular office visit this week.

## 2017-05-22 ENCOUNTER — Ambulatory Visit (INDEPENDENT_AMBULATORY_CARE_PROVIDER_SITE_OTHER): Payer: Medicare Other | Admitting: Family Medicine

## 2017-05-22 ENCOUNTER — Encounter: Payer: Self-pay | Admitting: Family Medicine

## 2017-05-22 VITALS — BP 132/70 | HR 72 | Temp 97.6°F | Ht 64.25 in | Wt 151.5 lb

## 2017-05-22 DIAGNOSIS — B9789 Other viral agents as the cause of diseases classified elsewhere: Secondary | ICD-10-CM | POA: Diagnosis not present

## 2017-05-22 DIAGNOSIS — D320 Benign neoplasm of cerebral meninges: Secondary | ICD-10-CM

## 2017-05-22 DIAGNOSIS — J069 Acute upper respiratory infection, unspecified: Secondary | ICD-10-CM

## 2017-05-22 NOTE — Patient Instructions (Signed)
I think you are ending a head and chest cold Watch for fever/facial pain or wheezing   Use our flonase Drink lots of fluids Get extra rest   Update if not starting to improve further in a week or if worsening

## 2017-05-22 NOTE — Assessment & Plan Note (Addendum)
Found 2011 incidental /small/B9 Adv by her neurologist Dr Carlis Abbott at that time that due to small size no action needs to be taken unless symptomatic

## 2017-05-22 NOTE — Assessment & Plan Note (Addendum)
Improved now (pt was worried about feeling of chest tightness-now improved)  Reassuring exam  Enc fluids/rest  flonase-continue Watch for facial pain /fever/worse cough Update if not starting to improve in a week or if worsening

## 2017-05-22 NOTE — Progress Notes (Signed)
Subjective:    Patient ID: Susan Woods, female    DOB: 1945-10-02, 72 y.o.   MRN: [id number]  HPI Here for acute illness   Sick last week -cough/congestion  And bloody nasal drainage  Started Thursday a week ago- 10 d - very achey/chills and temp was low grade  Then started coughing  Bad cong and headache   Started improving late last week    Temp: 97.6 F (36.4 C)  Pulse ox 99% RA  Today overall improved  Drainage slowed down a lot - a little blood in the nasal secretions  Cough -was mostly dry / and less often  Some sneezing (? Pollen)   Chest feels a little tight / better today than yesterday  Went swimming yesterday and it helped   Was exp to pneumonia -wanted to get checked   She has a very small meningioma on imaging 2011-no symptoms/does not feel ha is related   Patient Active Problem List   Diagnosis Date Noted  . Viral URI with cough 05/22/2017  . Internal hemorrhoids 04/27/2016  . Hypothyroid 01/13/2016  . Allergic reaction 06/22/2015  . 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 Colon cancer screening 12/22/2014  . Joint pain 08/26/2014  . Low back pain 05/18/2014  . Hypertensive retinopathy of both eyes, grade 1 12/16/2013  . Encounter for Medicare annual wellness exam 12/31/2012  . Urethritis 06/07/2012  . Routine general medical examination at a health care facility 02/20/2011  . PRURITUS 04/18/2010  . MENINGIOMA 11/17/2009  . TIA 11/17/2009  . Osteoporosis 03/19/2007  . HYPOGLYCEMIA 06/12/2006  . Essential hypertension 06/12/2006  . Allergic rhinitis 06/12/2006  . GERD 06/12/2006   Past Medical History:  Diagnosis Date  . Anemia   . GERD (gastroesophageal reflux disease)   . HTN (hypertension)    echo (8/11): EF 55%, mild MR, mild TR  . Meningioma ([place])    small; advised no f/u unless symptomatic by Dr. Loletta Specter 8/11  . Nasal pruritis    full body w/o rash   . Nonallergic rhinitis   . Osteoporosis   . Psoriasis    mild intermittent   .  Tingling    in hands of ? etiol   Past Surgical History:  Procedure Laterality Date  . 2D echo  8/11   nml   . ABDOMINAL HYSTERECTOMY    . ABDOMINAL SURGERY     adhesions  . CESAREAN SECTION    . COLONOSCOPY  2006  . dexa - osteopenia  2005   dexa 2009 - osteoporosis/fairly stable   . hosp TIA  8/11  . NASAL SINUS SURGERY  2002  . OOPHORECTOMY    . ORIF FINGER / THUMB FRACTURE  12/06  . small meningioma    . urethral stricutre     dilated  . VESICOVAGINAL FISTULA CLOSURE W/ TAH     Social History   Tobacco Use  . Smoking status: Former Smoker    Last attempt to quit: 04/08/1987    Years since quitting: 30.1  . Smokeless tobacco: Never Used  Substance Use Topics  . Alcohol use: Yes    Alcohol/week: 0.0 oz    Comment: rarely  . Drug use: No   Family History  Problem Relation Age of Onset  . Cancer Father        throat CA  . Diabetes Father   . Heart disease Mother        MI late 43s - CHF   . Hypertension Mother   .  Stroke Brother   . Diabetes Brother   . Heart disease Sister        CHF  . Diabetes Sister   . Hypertension Brother   . Diabetes Brother    Allergies  Allergen Reactions  . Amlodipine Swelling    Ankle swelling  . Indomethacin     REACTION: swelling, rash  . Levaquin [Levofloxacin In D5w] Other (See Comments)    Muscle pain   . Penicillins     REACTION: urticaria (hives)  . Sulfonamide Derivatives     REACTION: tingling around mouth and hands felt tight  . Zithromax [Azithromycin]   . Povidone-Iodine Hives and Itching   Current Outpatient Medications on File Prior to Visit  Medication Sig Dispense Refill  . amLODipine (NORVASC) 2.5 MG tablet Take 1 tablet (2.5 mg total) daily by mouth. 90 tablet 3  . Calcium Carb-Cholecalciferol (CALCIUM 600 + D PO) Take 1 tablet by mouth daily.     . Cholecalciferol (VITAMIN D3) 1000 UNITS CAPS Take 1 capsule by mouth daily.      . cyclobenzaprine (FLEXERIL) 10 MG tablet TAKE 1/2-1 TABLET BY MOUTH 3  TIMES DAILY AS NEEDED FOR MUSCLE SPASMS (WATCH FOR SEDATION) 30 tablet 0  . Ferrous Sulfate (FE-CAPS) 250 MG CPCR Take 1 capsule by mouth daily.      . fish oil-omega-3 fatty acids 1000 MG capsule Take 2 g by mouth 2 (two) times daily. Alternate with flax oil    . fluticasone (FLONASE) 50 MCG/ACT nasal spray Place 2 sprays into both nostrils daily. 48 g 3  . hydrochlorothiazide (HYDRODIURIL) 25 MG tablet Take 1 tablet (25 mg total) by mouth daily. 90 tablet 3  . hydrocortisone (ANUSOL-HC) 2.5 % rectal cream Place 1 application rectally at bedtime. 30 g 1  . levocetirizine (XYZAL) 5 MG tablet     . losartan (COZAAR) 100 MG tablet Take 1 tablet (100 mg total) by mouth daily. 90 tablet 3  . metoprolol succinate (TOPROL-XL) 100 MG 24 hr tablet Take 1 tablet by mouth  daily with or immediately  following a meal 90 tablet 3  . Multiple Vitamin (MULTIVITAMIN) tablet Take 1 tablet by mouth daily.      . NON FORMULARY similisan dry eye drops and ear drops. UAD PRN     . levothyroxine (SYNTHROID, LEVOTHROID) 50 MCG tablet Take by mouth.     No current facility-administered medications on file prior to visit.     Review of Systems  Constitutional: Positive for appetite change and fatigue. Negative for fever.  HENT: Positive for congestion, postnasal drip, rhinorrhea and sneezing. Negative for ear pain, sinus pressure, sinus pain and sore throat.        Head a L sided headache earlier today that is improved   Eyes: Negative for pain and discharge.  Respiratory: Positive for cough and chest tightness. Negative for shortness of breath, wheezing and stridor.   Cardiovascular: Negative for chest pain.  Gastrointestinal: Negative for diarrhea, nausea and vomiting.  Genitourinary: Negative for frequency, hematuria and urgency.  Musculoskeletal: Negative for arthralgias and myalgias.  Skin: Negative for rash.  Neurological: Positive for headaches. Negative for dizziness, weakness and light-headedness.    Psychiatric/Behavioral: Negative for confusion and dysphoric mood.       Objective:   Physical Exam  Constitutional: She appears well-developed and well-nourished. No distress.  Well appearing and energetic   HENT:  Head: Normocephalic and atraumatic.  Right Ear: External ear normal.  Left Ear: External ear normal.  Mouth/Throat: Oropharynx is clear and moist.  Nares are injected and congested  No sinus tenderness  Clear pnd  Eyes: Conjunctivae and EOM are normal. Pupils are equal, round, and reactive to light. Right eye exhibits no discharge. Left eye exhibits no discharge.  Neck: Normal range of motion. Neck supple.  Cardiovascular: Normal rate, regular rhythm and normal heart sounds.  Pulmonary/Chest: Effort normal and breath sounds normal. No respiratory distress. She has no wheezes. She has no rales. She exhibits no tenderness.  Good air exch No rales/rhonchi or wheezing  Lymphadenopathy:    She has no cervical adenopathy.  Neurological: She is alert.  Skin: Skin is warm and dry. No rash noted. No erythema. No pallor.  Psychiatric: She has a normal mood and affect.          Assessment & Plan:   Problem List Items Addressed This Visit      Respiratory   Viral URI with cough - Primary    Improved now (pt was worried about feeling of chest tightness-now improved)  Reassuring exam  Enc fluids/rest  flonase-continue Watch for facial pain /fever/worse cough Update if not starting to improve in a week or if worsening

## 2017-06-12 DIAGNOSIS — H35372 Puckering of macula, left eye: Secondary | ICD-10-CM | POA: Diagnosis not present

## 2017-06-12 DIAGNOSIS — H33192 Other retinoschisis and retinal cysts, left eye: Secondary | ICD-10-CM | POA: Diagnosis not present

## 2017-06-12 DIAGNOSIS — H43812 Vitreous degeneration, left eye: Secondary | ICD-10-CM | POA: Diagnosis not present

## 2017-06-12 DIAGNOSIS — H43392 Other vitreous opacities, left eye: Secondary | ICD-10-CM | POA: Diagnosis not present

## 2017-07-03 ENCOUNTER — Telehealth: Payer: Self-pay

## 2017-07-03 NOTE — Telephone Encounter (Signed)
In developed areas or in undeveloped areas?   [place] type work or vacation?

## 2017-07-03 NOTE — Telephone Encounter (Signed)
Copied from [place] [id number]. Topic: General - Other >> Jul 03, 2017  4:00 PM Margot Ables wrote: Pt is going to [place] and [place] later this year. Pt is asking if she needs to come in for additional shots/vaccinations. Please call pt to advise. Ok to leave detailed message for pt if no answer.

## 2017-07-04 NOTE — Telephone Encounter (Signed)
She is up to date on flu and pneumonia shots  The new shingles shot is not available (requires a waiting period) but she had the original zostavax so that is good  She needs a tetanus shot- medicare does not generally pay for this so look into price at pharmacy or health dept Tdap if around babies / Td if not   Other imms depend on where she is going (see prev questions)  The CDC website for travel can help her narrow this down  The health dept also does a travel clinic

## 2017-07-04 NOTE — Telephone Encounter (Signed)
Left VM requesting pt to call back and answer Dr. Marliss Coots questions  CRM created

## 2017-07-04 NOTE — Telephone Encounter (Signed)
Pt is going for vacation for 8 days. Wanting to make sure she is up to date on shingles and pneumonia and flu shots and if anything else is needed for the trip. CB# [phone]

## 2017-07-04 NOTE — Telephone Encounter (Signed)
Attempted to call re: Dr. Alba Cory questions. No answer, unable to leave VM.

## 2017-07-04 NOTE — Telephone Encounter (Signed)
Pt notified of Dr. Marliss Coots comments and recommendations. 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

## 2017-07-06 NOTE — Telephone Encounter (Signed)
Pt calling and wanted Dr. 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

## 2017-07-06 NOTE — Telephone Encounter (Signed)
Immunization record updated.

## 2017-07-17 ENCOUNTER — Other Ambulatory Visit: Payer: Self-pay | Admitting: Family Medicine

## 2017-08-24 ENCOUNTER — Other Ambulatory Visit: Payer: Self-pay | Admitting: Family Medicine

## 2017-11-02 ENCOUNTER — Other Ambulatory Visit: Payer: Self-pay | Admitting: Family Medicine

## 2017-11-29 ENCOUNTER — Other Ambulatory Visit: Payer: Self-pay | Admitting: Family Medicine

## 2017-12-12 DIAGNOSIS — I10 Essential (primary) hypertension: Secondary | ICD-10-CM | POA: Diagnosis not present

## 2017-12-12 DIAGNOSIS — E038 Other specified hypothyroidism: Secondary | ICD-10-CM | POA: Diagnosis not present

## 2018-01-08 DIAGNOSIS — H04123 Dry eye syndrome of bilateral lacrimal glands: Secondary | ICD-10-CM | POA: Diagnosis not present

## 2018-01-08 DIAGNOSIS — H25013 Cortical age-related cataract, bilateral: Secondary | ICD-10-CM | POA: Diagnosis not present

## 2018-01-08 DIAGNOSIS — H2513 Age-related nuclear cataract, bilateral: Secondary | ICD-10-CM | POA: Diagnosis not present

## 2018-01-08 DIAGNOSIS — H524 Presbyopia: Secondary | ICD-10-CM | POA: Diagnosis not present

## 2018-01-08 DIAGNOSIS — H59812 Chorioretinal scars after surgery for detachment, left eye: Secondary | ICD-10-CM | POA: Diagnosis not present

## 2018-01-09 DIAGNOSIS — Z1231 Encounter for screening mammogram for malignant neoplasm of breast: Secondary | ICD-10-CM | POA: Diagnosis not present

## 2018-01-10 ENCOUNTER — Encounter: Payer: Self-pay | Admitting: Family Medicine

## 2018-01-11 ENCOUNTER — Ambulatory Visit (INDEPENDENT_AMBULATORY_CARE_PROVIDER_SITE_OTHER): Payer: Medicare Other | Admitting: Family Medicine

## 2018-01-11 ENCOUNTER — Encounter: Payer: Self-pay | Admitting: Family Medicine

## 2018-01-11 VITALS — BP 162/92 | HR 71 | Temp 97.7°F | Ht 64.25 in | Wt 151.5 lb

## 2018-01-11 DIAGNOSIS — Z0000 Encounter for general adult medical examination without abnormal findings: Secondary | ICD-10-CM

## 2018-01-11 DIAGNOSIS — E039 Hypothyroidism, unspecified: Secondary | ICD-10-CM | POA: Diagnosis not present

## 2018-01-11 DIAGNOSIS — I10 Essential (primary) hypertension: Secondary | ICD-10-CM

## 2018-01-11 DIAGNOSIS — M810 Age-related osteoporosis without current pathological fracture: Secondary | ICD-10-CM

## 2018-01-11 LAB — CBC WITH DIFFERENTIAL/PLATELET
BASOS PCT: 0.4 % (ref 0.0–3.0)
Basophils Absolute: 0 10*3/uL (ref 0.0–0.1)
EOS PCT: 1.6 % (ref 0.0–5.0)
Eosinophils Absolute: 0.1 10*3/uL (ref 0.0–0.7)
HCT: 39.1 % (ref 36.0–46.0)
Hemoglobin: 13.2 g/dL (ref 12.0–15.0)
LYMPHS ABS: 1.9 10*3/uL (ref 0.7–4.0)
Lymphocytes Relative: 39.1 % (ref 12.0–46.0)
MCHC: 33.9 g/dL (ref 30.0–36.0)
MCV: 86.6 fl (ref 78.0–100.0)
MONO ABS: 0.3 10*3/uL (ref 0.1–1.0)
Monocytes Relative: 5.3 % (ref 3.0–12.0)
NEUTROS PCT: 53.6 % (ref 43.0–77.0)
Neutro Abs: 2.6 10*3/uL (ref 1.4–7.7)
Platelets: 251 10*3/uL (ref 150.0–400.0)
RBC: 4.51 Mil/uL (ref 3.87–5.11)
RDW: 14.7 % (ref 11.5–15.5)
WBC: 4.8 10*3/uL (ref 4.0–10.5)

## 2018-01-11 LAB — LIPID PANEL
CHOL/HDL RATIO: 3
CHOLESTEROL: 142 mg/dL (ref 0–200)
HDL: 50.7 mg/dL (ref >39.00)
LDL Cholesterol: 74 mg/dL (ref 0–99)
NONHDL: 90.88
Triglycerides: 84 mg/dL (ref 0.0–149.0)
VLDL: 16.8 mg/dL (ref 0.0–40.0)

## 2018-01-11 LAB — COMPREHENSIVE METABOLIC PANEL
ALBUMIN: 4.5 g/dL (ref 3.5–5.2)
ALT: 13 U/L (ref 0–35)
AST: 22 U/L (ref 0–37)
Alkaline Phosphatase: 74 U/L (ref 39–117)
BUN: 8 mg/dL (ref 6–23)
CHLORIDE: 105 meq/L (ref 96–112)
CO2: 29 mEq/L (ref 19–32)
Calcium: 9.8 mg/dL (ref 8.4–10.5)
Creatinine, Ser: 0.7 mg/dL (ref 0.40–1.20)
GFR: 106.31 mL/min (ref >60.00)
GLUCOSE: 105 mg/dL — AB (ref 70–99)
POTASSIUM: 3.5 meq/L (ref 3.5–5.1)
Sodium: 142 mEq/L (ref 135–145)
Total Bilirubin: 0.7 mg/dL (ref 0.2–1.2)
Total Protein: 7.8 g/dL (ref 6.0–8.3)

## 2018-01-11 MED ORDER — LEVOCETIRIZINE DIHYDROCHLORIDE 5 MG PO TABS: ORAL_TABLET | ORAL | 3 refills | Status: DC

## 2018-01-11 MED ORDER — METOPROLOL SUCCINATE ER 100 MG PO TB24: ORAL_TABLET | ORAL | 3 refills | Status: DC

## 2018-01-11 MED ORDER — LOSARTAN POTASSIUM 100 MG PO TABS: 100.0000 mg | ORAL_TABLET | Freq: Every day | ORAL | 3 refills | Status: DC

## 2018-01-11 MED ORDER — HYDROCHLOROTHIAZIDE 25 MG PO TABS: 25.0000 mg | ORAL_TABLET | Freq: Every day | ORAL | 3 refills | Status: DC

## 2018-01-11 MED ORDER — FLUTICASONE PROPIONATE 50 MCG/ACT NA SUSP: 2.0000 | Freq: Every day | NASAL | 3 refills | Status: DC

## 2018-01-11 MED ORDER — HYDROCHLOROTHIAZIDE 25 MG PO TABS: 25.0000 mg | ORAL_TABLET | Freq: Every day | ORAL | 0 refills | Status: DC

## 2018-01-11 NOTE — Patient Instructions (Addendum)
Get at least 2000 iu daily vit D3 over the counter  Calcium if you tolerated up to 600 mg twice daily (if not tolerated don't take it)   Keep taking good care of yourself!   Get back on hctz please  If side effects or problems please stop it and let us know

## 2018-01-11 NOTE — Progress Notes (Signed)
Subjective:    Patient ID: Susan Woods, female    DOB: 07-30-1945, 72 y.o.   MRN: [id number]  HPI  Here for health maintenance exam and to review chronic medical problems   I have personally reviewed the Medicare Annual Wellness questionnaire and have noted 1. The patient's medical and social history 2. Their use of alcohol, tobacco or illicit drugs 3. Their current medications and supplements 4. The patient's functional ability including ADL's, fall risks, home safety risks and hearing or visual             impairment. 5. Diet and physical activities 6. Evidence for depression or mood disorders  The patients weight, height, BMI have been recorded in the chart and visual acuity is per eye clinic.  I have made referrals, counseling and provided education to the patient based review of the above and I have provided the pt with a written personalized care plan for preventive services. Reviewed and updated provider list, see scanned forms.  See scanned forms.  Routine anticipatory guidance given to patient.  See health maintenance. Colon cancer screening  ifob 6/18 (outside the office) and colonoscopy 2/17 with 10 y recall  Breast cancer screening  Mammogram 10/19 neg Self breast exam-no lumps  Hep C screening - declines / very low risk  Flu vaccine 9/19 Tetanus vaccine 10/17 Pneumovax-completed  Zoster vaccine-zostavax 11/13 dexa 11/14- OP (last year declined more dexa tests) Intol of bisphosphenate Falls/fractures-none  Ca and D- takes mvi / occ ca  Advance directive- has a living will and pOA- daughter is pos Geophysicist/field seismologist)  Cognitive function addressed- see scanned forms- and if abnormal then additional documentation follows. -no concerns    Hearing Screening   _0  _1  _2  _3  _4  _5  _6  _7  _8   Right ear:   40 40 40  40    Left ear:   40 40 40  40    Vision Screening Comments: Pt had eye exam at [facility] on 12/2017   PMH  and SH reviewed  Meds, vitals, and allergies reviewed.   ROS: See HPI.  Otherwise negative.    Wt Readings from Last 3 Encounters:  01/11/18 151 lb 8 oz (68.7 kg)  05/22/17 151 lb 8 oz (68.7 kg)  01/08/17 149 lb 12 oz (67.9 kg)  does well to mt weight   25.80 kg/m   bp is up on first check today  Endo inc her amlodipine to 5 mg  No cp or palpitations or headaches or edema  No side effects to medicines  BP Readings from Last 3 Encounters:  01/11/18 (!) 162/92  05/22/17 132/70  01/08/17 140/90     Stopped her hctz - it "dried her out" too much  Was traveling however- and thinks she wants to try it again   Saw endocrinology recently Decreased dose recently   Due for labs for wellness   Exercise- step / cardio classes and yoga  Swims/water aerobics Tai chi Also volunteers at the hospital   Patient Active Problem List   Diagnosis Date Noted  . Internal hemorrhoids 04/27/2016  . Hypothyroid 01/13/2016  . 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 Colon cancer screening 12/22/2014  . Joint pain 08/26/2014  . Low back pain 05/18/2014  . Hypertensive retinopathy of both eyes, grade 1 12/16/2013  . Encounter for Medicare annual wellness exam 12/31/2012  . Routine general medical examination at a health care facility 02/20/2011  . MENINGIOMA 11/17/2009  . TIA 11/17/2009  . Osteoporosis 03/19/2007  .  HYPOGLYCEMIA 06/12/2006  . Essential hypertension 06/12/2006  . Allergic rhinitis 06/12/2006  . GERD 06/12/2006   Past Medical History:  Diagnosis Date  . Anemia   . GERD (gastroesophageal reflux disease)   . HTN (hypertension)    echo (8/11): EF 55%, mild MR, mild TR  . Meningioma ([place])    small; advised no f/u unless symptomatic by Dr. Loletta Specter 8/11  . Nasal pruritis    full body w/o rash   . Nonallergic rhinitis   . Osteoporosis   . Psoriasis    mild intermittent   . Tingling    in hands of ? etiol   Past Surgical History:  Procedure Laterality Date  . 2D echo  8/11   nml    . ABDOMINAL HYSTERECTOMY    . ABDOMINAL SURGERY     adhesions  . CESAREAN SECTION    . COLONOSCOPY  2006  . dexa - osteopenia  2005   dexa 2009 - osteoporosis/fairly stable   . hosp TIA  8/11  . NASAL SINUS SURGERY  2002  . OOPHORECTOMY    . ORIF FINGER / THUMB FRACTURE  12/06  . small meningioma    . urethral stricutre     dilated  . VESICOVAGINAL FISTULA CLOSURE W/ TAH     Social History   Tobacco Use  . Smoking status: Former Smoker    Last attempt to quit: 04/08/1987    Years since quitting: 30.7  . Smokeless tobacco: Never Used  Substance Use Topics  . Alcohol use: Yes    Alcohol/week: 0.0 standard drinks    Comment: rarely  . Drug use: No   Family History  Problem Relation Age of Onset  . Cancer Father        throat CA  . Diabetes Father   . Heart disease Mother        MI late 62s - CHF   . Hypertension Mother   . Stroke Brother   . Diabetes Brother   . Heart disease Sister        CHF  . Diabetes Sister   . Hypertension Brother   . Diabetes Brother    Allergies  Allergen Reactions  . Amlodipine Swelling    Ankle swelling  . Indomethacin     REACTION: swelling, rash  . Levaquin [Levofloxacin In D5w] Other (See Comments)    Muscle pain   . Penicillins     REACTION: urticaria (hives)  . Sulfonamide Derivatives     REACTION: tingling around mouth and hands felt tight  . Zithromax [Azithromycin]   . Povidone-Iodine Hives and Itching   Current Outpatient Medications on File Prior to Visit  Medication Sig Dispense Refill  . amLODipine (NORVASC) 5 MG tablet Take 1 tablet by mouth daily.    . Calcium Carb-Cholecalciferol (CALCIUM 600 + D PO) Take 1 tablet by mouth daily.     . Cholecalciferol (VITAMIN D3) 1000 UNITS CAPS Take 1 capsule by mouth daily.      . cyclobenzaprine (FLEXERIL) 10 MG tablet TAKE 1/2-1 TABLET BY MOUTH 3 TIMES DAILY AS NEEDED FOR MUSCLE SPASMS (WATCH FOR SEDATION) 30 tablet 0  . Ferrous Sulfate (FE-CAPS) 250 MG CPCR Take 1 capsule  by mouth daily.      . fish oil-omega-3 fatty acids 1000 MG capsule Take 2 g by mouth 2 (two) times daily. Alternate with flax oil    . hydrocortisone (ANUSOL-HC) 2.5 % rectal cream Place 1 application rectally at bedtime. 30 g 1  .  levocetirizine (XYZAL) 5 MG tablet     . Multiple Vitamin (MULTIVITAMIN) tablet Take 1 tablet by mouth daily.      . NON FORMULARY similisan dry eye drops and ear drops. UAD PRN     . levothyroxine (SYNTHROID, LEVOTHROID) 50 MCG tablet Take by mouth.     No current facility-administered medications on file prior to visit.      Review of Systems  Constitutional: Negative for activity change, appetite change, fatigue, fever and unexpected weight change.  HENT: Negative for congestion, ear pain, rhinorrhea, sinus pressure and sore throat.   Eyes: Negative for pain, redness and visual disturbance.  Respiratory: Negative for cough, shortness of breath and wheezing.   Cardiovascular: Negative for chest pain and palpitations.  Gastrointestinal: Negative for abdominal pain, blood in stool, constipation and diarrhea.  Endocrine: Negative for polydipsia and polyuria.  Genitourinary: Negative for dysuria, frequency and urgency.  Musculoskeletal: Negative for arthralgias, back pain and myalgias.  Skin: Negative for pallor and rash.  Allergic/Immunologic: Negative for environmental allergies.  Neurological: Negative for dizziness, syncope and headaches.  Hematological: Negative for adenopathy. Does not bruise/bleed easily.  Psychiatric/Behavioral: Negative for decreased concentration and dysphoric mood. The patient is not nervous/anxious.        Objective:   Physical Exam  Constitutional: She appears well-developed and well-nourished. No distress.  Well appearing   HENT:  Head: Normocephalic and atraumatic.  Right Ear: External ear normal.  Left Ear: External ear normal.  Mouth/Throat: Oropharynx is clear and moist.  Eyes: Pupils are equal, round, and reactive to  light. Conjunctivae and EOM are normal. No scleral icterus.  Neck: Normal range of motion. Neck supple. No JVD present. Carotid bruit is not present. No thyromegaly present.  Cardiovascular: Normal rate, regular rhythm, normal heart sounds and intact distal pulses. Exam reveals no gallop.  Pulmonary/Chest: Effort normal and breath sounds normal. No respiratory distress. She has no wheezes. She has no rales. She exhibits no tenderness. No breast tenderness, discharge or bleeding.  Abdominal: Soft. Bowel sounds are normal. She exhibits no distension, no abdominal bruit and no mass. There is no tenderness.  Genitourinary: No breast tenderness, discharge or bleeding.  Genitourinary Comments: Breast exam: No mass, nodules, thickening, tenderness, bulging, retraction, inflamation, nipple discharge or skin changes noted.  No axillary or clavicular LA.      Musculoskeletal: Normal range of motion. She exhibits no edema or tenderness.  Lymphadenopathy:    She has no cervical adenopathy.  Neurological: She is alert. She has normal reflexes. She displays normal reflexes. No cranial nerve deficit. She exhibits normal muscle tone. Coordination normal.  Skin: Skin is warm and dry. No rash noted. No erythema. No pallor.  Few skin tags  Psychiatric: She has a normal mood and affect.  Pleasant           Assessment & Plan:   Problem List Items Addressed This Visit      Cardiovascular and Mediastinum   Essential hypertension    bp is elevated off hctz (she stopped on vacation when hot with fear of dehydration and never re started) Endo also inc her amlodipine to 5 mg  She will re start hctz  F/u 1 mo for re check  Good health habits  Also checks at home  Will alert if side eff or problems      Relevant Medications   amLODipine (NORVASC) 5 MG tablet   losartan (COZAAR) 100 MG tablet   metoprolol succinate (TOPROL-XL) 100 MG 24 hr tablet  hydrochlorothiazide (HYDRODIURIL) 25 MG tablet   Other  Relevant Orders   CBC with Differential/Platelet (Completed)   Comprehensive metabolic panel (Completed)   Lipid panel (Completed)     Endocrine   Hypothyroid    Sees endocrinology Recent dec in levothy dose No clinical changes      Relevant Medications   metoprolol succinate (TOPROL-XL) 100 MG 24 hr tablet     Musculoskeletal and Integument   Osteoporosis    Declines further dexa or tx at this time No falls or fx Intol of bisphosphenate        Other   Encounter for Medicare annual wellness exam - Primary    Reviewed health habits including diet and exercise and skin cancer prevention Reviewed appropriate screening tests for age  Also reviewed health mt list, fam hx and immunization status , as well as social and family history   See HPI Labs reviewed  Pt will start back on hctz for HTN declines dexa or tx for OP  Strongly enc vit D and ca intake if able  Commended exercise Labs today      Routine general medical examination at a health care facility    Reviewed health habits including diet and exercise and skin cancer prevention Reviewed appropriate screening tests for age  Also reviewed health mt list, fam hx and immunization status , as well as social and family history   See HPI Labs reviewed  Pt will start back on hctz for HTN declines dexa or tx for OP  Strongly enc vit D and ca intake if able  Commended exercise Labs today

## 2018-01-11 NOTE — Assessment & Plan Note (Signed)
bp is elevated off hctz (she stopped on vacation when hot with fear of dehydration and never re started) Endo also inc her amlodipine to 5 mg  She will re start hctz  F/u 1 mo for re check  Good health habits  Also checks at home  Will alert if side eff or problems

## 2018-01-11 NOTE — Assessment & Plan Note (Signed)
Sees endocrinology Recent dec in levothy dose No clinical changes

## 2018-01-11 NOTE — Assessment & Plan Note (Signed)
Declines further dexa or tx at this time No falls or fx Intol of bisphosphenate

## 2018-01-13 NOTE — Assessment & Plan Note (Signed)
Reviewed health habits including diet and exercise and skin cancer prevention Reviewed appropriate screening tests for age  Also reviewed health mt list, fam hx and immunization status , as well as social and family history   See HPI Labs reviewed  Pt will start back on hctz for HTN declines dexa or tx for OP  Strongly enc vit D and ca intake if able  Commended exercise Labs today

## 2018-01-16 ENCOUNTER — Other Ambulatory Visit: Payer: Self-pay | Admitting: *Deleted

## 2018-01-16 ENCOUNTER — Telehealth: Payer: Self-pay

## 2018-01-16 NOTE — Telephone Encounter (Signed)
Pt called and only wanted to speak with Shapale; Shapale spoke with pt.

## 2018-01-17 MED ORDER — HYDROCHLOROTHIAZIDE 25 MG PO TABS: 25.0000 mg | ORAL_TABLET | Freq: Every day | ORAL | 1 refills | Status: DC

## 2018-01-25 ENCOUNTER — Encounter: Payer: Self-pay | Admitting: Family Medicine

## 2018-01-25 ENCOUNTER — Ambulatory Visit (INDEPENDENT_AMBULATORY_CARE_PROVIDER_SITE_OTHER): Payer: Medicare Other | Admitting: Family Medicine

## 2018-01-25 VITALS — BP 140/70 | HR 90 | Temp 98.2°F | Ht 64.25 in | Wt 152.5 lb

## 2018-01-25 DIAGNOSIS — J069 Acute upper respiratory infection, unspecified: Secondary | ICD-10-CM | POA: Diagnosis not present

## 2018-01-25 DIAGNOSIS — I10 Essential (primary) hypertension: Secondary | ICD-10-CM

## 2018-01-25 DIAGNOSIS — B9789 Other viral agents as the cause of diseases classified elsewhere: Secondary | ICD-10-CM

## 2018-01-25 MED ORDER — BENZONATATE 200 MG PO CAPS: 200.0000 mg | ORAL_CAPSULE | Freq: Three times a day (TID) | ORAL | 1 refills | Status: DC | PRN

## 2018-01-25 NOTE — Patient Instructions (Addendum)
Drink lots of fluids and rest  Tylenol for aches and pains  You are contagious- do not volunteer   For congestion- chlorcedin  For runny nose and drip - antihistamine of choice  For cough mucinex DM   Try tesslon as well for cough   Update if not starting to improve in a week or if worsening   Watch for facial pain / shortness of breath / worse cough and wheezing

## 2018-01-25 NOTE — Progress Notes (Signed)
Subjective:    Patient ID: Susan Woods, female    DOB: Feb 07, 1946, 72 y.o.   MRN: [id number]  HPI Here for c/o chest congestion   Tight irritated chest /chest wall  Sore from cough - used a muscle relaxer Symptoms started last week - cough / sore chest (worse to take a deep breath)  Prod of yellow mucous  Eyes are irritated /blood shot   Stuffy and runny nose with pnd (chokes her)  Irritated throat-not severely sore  Some facial pressure around eyes  Nasal d/c- most pnd - yellow   Temp: 98.2 F (36.8 C)  Did not check temp  Some body aches  Some chills and light headed   Otc: chlorcedin hpb    Wt Readings from Last 3 Encounters:  01/25/18 152 lb 8 oz (69.2 kg)  01/11/18 151 lb 8 oz (68.7 kg)  05/22/17 151 lb 8 oz (68.7 kg)   25.97 kg/m   bp is still high    BP Readings from Last 3 Encounters:  01/25/18 (!) 152/82  01/11/18 (!) 162/92  05/22/17 132/70  last visit was elevated due to not taking hctz  Amlodipine 5 hctz 25 mg   At home 140s/70s  2nd check BP: 140/70   Patient Active Problem List   Diagnosis Date Noted  . Viral URI with cough 05/22/2017  . Internal hemorrhoids 04/27/2016  . Hypothyroid 01/13/2016  . 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 Colon cancer screening 12/22/2014  . Joint pain 08/26/2014  . Low back pain 05/18/2014  . Hypertensive retinopathy of both eyes, grade 1 12/16/2013  . Encounter for Medicare annual wellness exam 12/31/2012  . Routine general medical examination at a health care facility 02/20/2011  . MENINGIOMA 11/17/2009  . TIA 11/17/2009  . Osteoporosis 03/19/2007  . HYPOGLYCEMIA 06/12/2006  . Essential hypertension 06/12/2006  . Allergic rhinitis 06/12/2006  . GERD 06/12/2006   Past Medical History:  Diagnosis Date  . Anemia   . GERD (gastroesophageal reflux disease)   . HTN (hypertension)    echo (8/11): EF 55%, mild MR, mild TR  . Meningioma ([place])    small; advised no f/u unless symptomatic by Dr.  Loletta Specter 8/11  . Nasal pruritis    full body w/o rash   . Nonallergic rhinitis   . Osteoporosis   . Psoriasis    mild intermittent   . Tingling    in hands of ? etiol   Past Surgical History:  Procedure Laterality Date  . 2D echo  8/11   nml   . ABDOMINAL HYSTERECTOMY    . ABDOMINAL SURGERY     adhesions  . CESAREAN SECTION    . COLONOSCOPY  2006  . dexa - osteopenia  2005   dexa 2009 - osteoporosis/fairly stable   . hosp TIA  8/11  . NASAL SINUS SURGERY  2002  . OOPHORECTOMY    . ORIF FINGER / THUMB FRACTURE  12/06  . small meningioma    . urethral stricutre     dilated  . VESICOVAGINAL FISTULA CLOSURE W/ TAH     Social History   Tobacco Use  . Smoking status: Former Smoker    Last attempt to quit: 04/08/1987    Years since quitting: 30.8  . Smokeless tobacco: Never Used  Substance Use Topics  . Alcohol use: Yes    Alcohol/week: 0.0 standard drinks    Comment: rarely  . Drug use: No   Family History  Problem Relation Age of Onset  .  Cancer Father        throat CA  . Diabetes Father   . Heart disease Mother        MI late 60s - CHF   . Hypertension Mother   . Stroke Brother   . Diabetes Brother   . Heart disease Sister        CHF  . Diabetes Sister   . Hypertension Brother   . Diabetes Brother    Allergies  Allergen Reactions  . Amlodipine Swelling    Ankle swelling  . Indomethacin     REACTION: swelling, rash  . Levaquin [Levofloxacin In D5w] Other (See Comments)    Muscle pain   . Penicillins     REACTION: urticaria (hives)  . Sulfonamide Derivatives     REACTION: tingling around mouth and hands felt tight  . Zithromax [Azithromycin]   . Povidone-Iodine Hives and Itching   Current Outpatient Medications on File Prior to Visit  Medication Sig Dispense Refill  . amLODipine (NORVASC) 5 MG tablet Take 1 tablet by mouth daily.    . Calcium Carb-Cholecalciferol (CALCIUM 600 + D PO) Take 1 tablet by mouth daily.     . Cholecalciferol (VITAMIN D3)  1000 UNITS CAPS Take 1 capsule by mouth daily.      . cyclobenzaprine (FLEXERIL) 10 MG tablet TAKE 1/2-1 TABLET BY MOUTH 3 TIMES DAILY AS NEEDED FOR MUSCLE SPASMS (WATCH FOR SEDATION) 30 tablet 0  . Ferrous Sulfate (FE-CAPS) 250 MG CPCR Take 1 capsule by mouth daily.      . fish oil-omega-3 fatty acids 1000 MG capsule Take 2 g by mouth 2 (two) times daily. Alternate with flax oil    . fluticasone (FLONASE) 50 MCG/ACT nasal spray Place 2 sprays into both nostrils daily. 48 g 3  . hydrochlorothiazide (HYDRODIURIL) 25 MG tablet Take 1 tablet (25 mg total) by mouth daily. 90 tablet 1  . hydrocortisone (ANUSOL-HC) 2.5 % rectal cream Place 1 application rectally at bedtime. 30 g 1  . levocetirizine (XYZAL) 5 MG tablet     . levocetirizine (XYZAL) 5 MG tablet TAKE 1 TABLET BY MOUTH  DAILY AS NEEDED 90 tablet 3  . losartan (COZAAR) 100 MG tablet Take 1 tablet (100 mg total) by mouth daily. 90 tablet 3  . metoprolol succinate (TOPROL-XL) 100 MG 24 hr tablet Take with or immediately following a meal. 90 tablet 3  . Multiple Vitamin (MULTIVITAMIN) tablet Take 1 tablet by mouth daily.      . NON FORMULARY similisan dry eye drops and ear drops. UAD PRN     . levothyroxine (SYNTHROID, LEVOTHROID) 50 MCG tablet Take by mouth.     No current facility-administered medications on file prior to visit.      Review of Systems  Constitutional: Positive for appetite change and fatigue. Negative for fever.       Some chills occas  HENT: Positive for congestion, postnasal drip, rhinorrhea, sinus pressure, sneezing and sore throat. Negative for ear pain.   Eyes: Negative for pain and discharge.  Respiratory: Positive for cough and chest tightness. Negative for choking, shortness of breath, wheezing and stridor.   Cardiovascular: Negative for chest pain.  Gastrointestinal: Negative for diarrhea, nausea and vomiting.  Genitourinary: Negative for frequency, hematuria and urgency.  Musculoskeletal: Negative for  arthralgias and myalgias.  Skin: Negative for rash.  Neurological: Positive for headaches. Negative for dizziness, weakness and light-headedness.  Psychiatric/Behavioral: Negative for confusion and dysphoric mood.       Objective:  Physical Exam  Constitutional: She appears well-developed and well-nourished. No distress.  Well appearing    HENT:  Head: Normocephalic and atraumatic.  Right Ear: External ear normal.  Left Ear: External ear normal.  Mouth/Throat: Oropharynx is clear and moist.  Nares are injected and congested  No sinus tenderness Clear rhinorrhea and post nasal drip   Eyes: Pupils are equal, round, and reactive to light. Conjunctivae and EOM are normal. Right eye exhibits no discharge. Left eye exhibits no discharge. No scleral icterus.  Neck: Normal range of motion. Neck supple.  Cardiovascular: Normal rate and normal heart sounds.  Pulmonary/Chest: Effort normal and breath sounds normal. No respiratory distress. She has no wheezes. She has no rales. She exhibits tenderness.  Some R sided anterior chest wall tenderness w/o crepitus or skin change   Good air exch No rales/rhonchi No wheeze even on forced exp  Lymphadenopathy:    She has no cervical adenopathy.  Neurological: She is alert. She displays normal reflexes. No cranial nerve deficit. Coordination normal.  Skin: Skin is warm and dry. No rash noted. No erythema. No pallor.  Psychiatric: She has a normal mood and affect.          Assessment & Plan:   Problem List Items Addressed This Visit      Cardiovascular and Mediastinum   Essential hypertension    bp in fair control at this time  BP Readings from Last 1 Encounters:  01/25/18 140/70   No changes needed Most recent labs reviewed  Disc lifstyle change with low sodium diet and exercise          Respiratory   Viral URI with cough - Primary    Some chest soreness from cough Re assuring exam Disc symptomatic care - see instructions on  AVS  For congestion- chlorcedin  For runny nose and drip - antihistamine of choice  For cough mucinex DM   Try tesslon as well for cough   Update if not starting to improve in a week or if worsening   Watch for facial pain / shortness of breath / worse cough and wheezing

## 2018-01-26 NOTE — Assessment & Plan Note (Signed)
Some chest soreness from cough Re assuring exam Disc symptomatic care - see instructions on AVS  For congestion- chlorcedin  For runny nose and drip - antihistamine of choice  For cough mucinex DM   Try tesslon as well for cough   Update if not starting to improve in a week or if worsening   Watch for facial pain / shortness of breath / worse cough and wheezing

## 2018-01-26 NOTE — Assessment & Plan Note (Signed)
bp in fair control at this time  BP Readings from Last 1 Encounters:  01/25/18 140/70   No changes needed Most recent labs reviewed  Disc lifstyle change with low sodium diet and exercise

## 2018-01-30 ENCOUNTER — Ambulatory Visit: Payer: Medicare Other | Admitting: Family Medicine

## 2018-01-30 ENCOUNTER — Telehealth: Payer: Self-pay | Admitting: *Deleted

## 2018-01-30 MED ORDER — PROMETHAZINE-DM 6.25-15 MG/5ML PO SYRP: 5.0000 mL | ORAL_SOLUTION | Freq: Three times a day (TID) | ORAL | 0 refills | Status: DC | PRN

## 2018-01-30 NOTE — Telephone Encounter (Signed)
I sent prometh dm- caution of sedation   Do not mix with any DM product otc -this has dm in it   Alert [place] if not improving

## 2018-01-30 NOTE — Telephone Encounter (Signed)
Received fax from pharmacy saying that pt's insurance doesn't cover tessalon and they are requesting that you send in a different med to help with cough, please advise   [facility]

## 2018-01-30 NOTE — Telephone Encounter (Signed)
Pt notified of Dr. Marliss Coots comments but she told me she didn't need the cough med because when the tessalon wasn't covered she just got OTC mucinex DM and she is feeling better today

## 2018-03-08 ENCOUNTER — Ambulatory Visit: Payer: Medicare Other | Admitting: Family Medicine

## 2018-04-07 ENCOUNTER — Other Ambulatory Visit: Payer: Self-pay | Admitting: Family Medicine

## 2018-04-08 NOTE — Telephone Encounter (Signed)
Called [facility] [place], refill for HCTZ was sent on November 09/2017 for 90 day supply with 1 additional refill. Per pharmacist their system only shows that it was sent in for 90 day supply with no other refills. Patient had AWv in November 2019. Refilling HCTZ today.

## 2018-04-15 ENCOUNTER — Encounter: Payer: Self-pay | Admitting: Family Medicine

## 2018-04-15 ENCOUNTER — Ambulatory Visit (INDEPENDENT_AMBULATORY_CARE_PROVIDER_SITE_OTHER): Payer: Medicare Other | Admitting: Family Medicine

## 2018-04-15 VITALS — BP 128/80 | HR 79 | Temp 98.0°F | Ht 64.25 in

## 2018-04-15 DIAGNOSIS — I10 Essential (primary) hypertension: Secondary | ICD-10-CM | POA: Diagnosis not present

## 2018-04-15 DIAGNOSIS — E039 Hypothyroidism, unspecified: Secondary | ICD-10-CM

## 2018-04-15 DIAGNOSIS — R0789 Other chest pain: Secondary | ICD-10-CM | POA: Diagnosis not present

## 2018-04-15 DIAGNOSIS — R252 Cramp and spasm: Secondary | ICD-10-CM | POA: Diagnosis not present

## 2018-04-15 DIAGNOSIS — D320 Benign neoplasm of cerebral meninges: Secondary | ICD-10-CM | POA: Diagnosis not present

## 2018-04-15 LAB — RENAL FUNCTION PANEL
ALBUMIN: 4.2 g/dL (ref 3.5–5.2)
BUN: 12 mg/dL (ref 6–23)
CO2: 30 mEq/L (ref 19–32)
Calcium: 9.6 mg/dL (ref 8.4–10.5)
Chloride: 100 mEq/L (ref 96–112)
Creatinine, Ser: 0.75 mg/dL (ref 0.40–1.20)
GFR: 92.3 mL/min (ref >60.00)
Glucose, Bld: 83 mg/dL (ref 70–99)
Phosphorus: 3.6 mg/dL (ref 2.3–4.6)
Potassium: 3.7 mEq/L (ref 3.5–5.1)
Sodium: 136 mEq/L (ref 135–145)

## 2018-04-15 LAB — SEDIMENTATION RATE: Sed Rate: 25 mm/hr (ref 0–30)

## 2018-04-15 LAB — TSH: TSH: <0.01 u[IU]/mL — ABNORMAL LOW (ref 0.35–4.50)

## 2018-04-15 LAB — CK: Total CK: 58 U/L (ref 7–177)

## 2018-04-15 MED ORDER — AMLODIPINE BESYLATE 5 MG PO TABS: 5.0000 mg | ORAL_TABLET | Freq: Every day | ORAL | 3 refills | Status: DC

## 2018-04-15 MED ORDER — CYCLOBENZAPRINE HCL 10 MG PO TABS: ORAL_TABLET | ORAL | 3 refills | Status: DC

## 2018-04-15 NOTE — Assessment & Plan Note (Signed)
bp in fair control at this time  BP Readings from Last 1 Encounters:  04/15/18 128/80   No changes needed Most recent labs reviewed  Disc lifstyle change with low sodium diet and exercise

## 2018-04-15 NOTE — Assessment & Plan Note (Signed)
Pt thinks this is from muscle cramping of sides and arms Reassuring exam  Not exertional  EKG today NSR with rate of 68 and no acute ST or T wave changes  Reassuring  Consider further w/u if this returns or persists

## 2018-04-15 NOTE — Progress Notes (Signed)
Subjective:    Patient ID: Susan Woods, female    DOB: [birth year], 72 y.o.   MRN: [id number]  HPI Here with c/o of leg cramps   Wt Readings from Last 3 Encounters:  01/25/18 152 lb 8 oz (69.2 kg)  01/11/18 151 lb 8 oz (68.7 kg)  05/22/17 151 lb 8 oz (68.7 kg)   25.97 kg/m   bp is stable today  No cp or palpitations or headaches or edema  No side effects to medicines  BP Readings from Last 3 Encounters:  04/15/18 128/80  01/25/18 140/70  01/11/18 (!) 162/92     Amlodipine 5 mg  hctz 25 mg daily  Losartan 100 mg   No trouble with any medicines -doing well  No cp or sob   Lab Results  Component Value Date   CREATININE 0.70 01/11/2018   BUN 8 01/11/2018   NA 142 01/11/2018   K 3.5 01/11/2018   CL 105 01/11/2018   CO2 29 01/11/2018   Has had muscle cramps more in legs (Tuesday after working out) - did a lot of exercise that day- cardio dance and walking and yoga   In calves  Also now her upper arms  Also in her sides   No change in medicines Muscle relaxer was helping - does help when she remembers it  It makes her sleepy - takes them at night and needs a refill   Cramps are still generally at night   She has had muscle cramps for years and years  She is sore afterwards occ sore w/o any cramps   Lab Results  Component Value Date   TSH <0.01 Repeated and verified X2. (L) 01/08/2017   she had her levothy dose adj by her endocrinologist since  Her next visit is a year  Sees Dr Jonna Munro  Wants to do labs today   Also occ has sorness in chest  Not exertional  EKG today NSR with rate of 68  Patient Active Problem List   Diagnosis Date Noted  . Muscle cramps 04/15/2018  . Internal hemorrhoids 04/27/2016  . Hypothyroid 01/13/2016  . 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 Colon cancer screening 12/22/2014  . Joint pain 08/26/2014  . Low back pain 05/18/2014  . Hypertensive retinopathy of both eyes, grade 1 12/16/2013  . Encounter for Medicare  annual wellness exam 12/31/2012  . Routine general medical examination at a health care facility 02/20/2011  . Chest discomfort 05/16/2010  . MENINGIOMA 11/17/2009  . Osteoporosis 03/19/2007  . HYPOGLYCEMIA 06/12/2006  . Essential hypertension 06/12/2006  . Allergic rhinitis 06/12/2006  . GERD 06/12/2006   Past Medical History:  Diagnosis Date  . Anemia   . GERD (gastroesophageal reflux disease)   . HTN (hypertension)    echo (8/11): EF 55%, mild MR, mild TR  . Meningioma ([place])    small; advised no f/u unless symptomatic by Dr. Loletta Specter 8/11  . Nasal pruritis    full body w/o rash   . Nonallergic rhinitis   . Osteoporosis   . Psoriasis    mild intermittent   . Tingling    in hands of ? etiol   Past Surgical History:  Procedure Laterality Date  . 2D echo  8/11   nml   . ABDOMINAL HYSTERECTOMY    . ABDOMINAL SURGERY     adhesions  . CESAREAN SECTION    . COLONOSCOPY  2006  . dexa - osteopenia  2005   dexa 2009 - osteoporosis/fairly stable   .  hosp TIA  8/11  . NASAL SINUS SURGERY  2002  . OOPHORECTOMY    . ORIF FINGER / THUMB FRACTURE  12/06  . small meningioma    . urethral stricutre     dilated  . VESICOVAGINAL FISTULA CLOSURE W/ TAH     Social History   Tobacco Use  . Smoking status: Former Smoker    Last attempt to quit: 04/08/1987    Years since quitting: 31.0  . Smokeless tobacco: Never Used  Substance Use Topics  . Alcohol use: Yes    Alcohol/week: 0.0 standard drinks    Comment: rarely  . Drug use: No   Family History  Problem Relation Age of Onset  . Cancer Father        throat CA  . Diabetes Father   . Heart disease Mother        MI late 54s - CHF   . Hypertension Mother   . Stroke Brother   . Diabetes Brother   . Heart disease Sister        CHF  . Diabetes Sister   . Hypertension Brother   . Diabetes Brother    Allergies  Allergen Reactions  . Amlodipine Swelling    Ankle swelling  . Indomethacin     REACTION: swelling, rash  .  Levaquin [Levofloxacin In D5w] Other (See Comments)    Muscle pain   . Penicillins     REACTION: urticaria (hives)  . Sulfonamide Derivatives     REACTION: tingling around mouth and hands felt tight  . Zithromax [Azithromycin]   . Povidone-Iodine Hives and Itching   Current Outpatient Medications on File Prior to Visit  Medication Sig Dispense Refill  . Calcium Carb-Cholecalciferol (CALCIUM 600 + D PO) Take 1 tablet by mouth daily.     . Cholecalciferol (VITAMIN D3) 1000 UNITS CAPS Take 1 capsule by mouth daily.      . Ferrous Sulfate (FE-CAPS) 250 MG CPCR Take 1 capsule by mouth daily.      . fish oil-omega-3 fatty acids 1000 MG capsule Take 2 g by mouth 2 (two) times daily. Alternate with flax oil    . fluticasone (FLONASE) 50 MCG/ACT nasal spray Place 2 sprays into both nostrils daily. 48 g 3  . hydrochlorothiazide (HYDRODIURIL) 25 MG tablet TAKE 1 TABLET BY MOUTH DAILY 90 tablet 1  . hydrocortisone (ANUSOL-HC) 2.5 % rectal cream Place 1 application rectally at bedtime. 30 g 1  . levocetirizine (XYZAL) 5 MG tablet TAKE 1 TABLET BY MOUTH  DAILY AS NEEDED 90 tablet 3  . losartan (COZAAR) 100 MG tablet Take 1 tablet (100 mg total) by mouth daily. 90 tablet 3  . metoprolol succinate (TOPROL-XL) 100 MG 24 hr tablet Take with or immediately following a meal. 90 tablet 3  . Multiple Vitamin (MULTIVITAMIN) tablet Take 1 tablet by mouth daily.      . NON FORMULARY similisan dry eye drops and ear drops. UAD PRN     . levothyroxine (SYNTHROID, LEVOTHROID) 50 MCG tablet Take by mouth.     No current facility-administered medications on file prior to visit.     Review of Systems  Constitutional: Negative for activity change, appetite change, fatigue, fever and unexpected weight change.  HENT: Negative for congestion, ear pain, rhinorrhea, sinus pressure and sore throat.   Eyes: Negative for pain, redness and visual disturbance.  Respiratory: Negative for cough, shortness of breath and wheezing.    Cardiovascular: Positive for chest pain. Negative for palpitations  and leg swelling.       Chest wall pain   Gastrointestinal: Negative for abdominal pain, blood in stool, constipation and diarrhea.  Endocrine: Negative for polydipsia and polyuria.  Genitourinary: Negative for dysuria, frequency and urgency.  Musculoskeletal: Negative for arthralgias, back pain and myalgias.       Muscle cramps  Upper body achy feeling  Some chest wall pain  Skin: Negative for pallor and rash.  Allergic/Immunologic: Negative for environmental allergies.  Neurological: Negative for dizziness, syncope and headaches.  Hematological: Negative for adenopathy. Does not bruise/bleed easily.  Psychiatric/Behavioral: Negative for decreased concentration and dysphoric mood. The patient is not nervous/anxious.        Objective:   Physical Exam Constitutional:      General: She is not in acute distress.    Appearance: Normal appearance. She is well-developed and normal weight. She is not ill-appearing or diaphoretic.  HENT:     Head: Normocephalic and atraumatic.     Mouth/Throat:     Mouth: Mucous membranes are moist.     Pharynx: Oropharynx is clear.  Eyes:     General: No scleral icterus.    Conjunctiva/sclera: Conjunctivae normal.     Pupils: Pupils are equal, round, and reactive to light.  Neck:     Musculoskeletal: Normal range of motion and neck supple. No neck rigidity.     Thyroid: No thyromegaly.     Vascular: No carotid bruit or JVD.  Cardiovascular:     Rate and Rhythm: Normal rate and regular rhythm.     Pulses: Normal pulses.     Heart sounds: Normal heart sounds. No gallop.   Pulmonary:     Effort: Pulmonary effort is normal. No respiratory distress.     Breath sounds: Normal breath sounds. No wheezing or rales.  Abdominal:     General: Bowel sounds are normal. There is no distension or abdominal bruit.     Palpations: Abdomen is soft. There is no mass.     Tenderness: There is no  abdominal tenderness.  Musculoskeletal:        General: No tenderness or deformity.     Right lower leg: No edema.     Left lower leg: No edema.     Comments: No myofascial tenderness  Lymphadenopathy:     Cervical: No cervical adenopathy.  Skin:    General: Skin is warm and dry.     Findings: No rash.  Neurological:     Mental Status: She is alert. Mental status is at baseline.     Coordination: Coordination normal.     Deep Tendon Reflexes: Reflexes are normal and symmetric. Reflexes normal.  Psychiatric:        Mood and Affect: Mood normal.     Comments: Cheerful            Assessment & Plan:   Problem List Items Addressed This Visit      Cardiovascular and Mediastinum   Essential hypertension    bp in fair control at this time  BP Readings from Last 1 Encounters:  04/15/18 128/80   No changes needed Most recent labs reviewed  Disc lifstyle change with low sodium diet and exercise        Relevant Medications   amLODipine (NORVASC) 5 MG tablet   Other Relevant Orders   Renal function panel (Completed)     Endocrine   Hypothyroid    Pt sees endocrinology In light of worse muscle cramping TSH checked today  Currently  taking 50 mcg of levothyroxine      Relevant Orders   TSH (Completed)     Nervous and Auditory   MENINGIOMA    Clinically stable No symptoms        Other   Chest discomfort    Pt thinks this is from muscle cramping of sides and arms Reassuring exam  Not exertional  EKG today NSR with rate of 68 and no acute ST or T wave changes  Reassuring  Consider further w/u if this returns or persists       Relevant Orders   EKG 12-Lead (Completed)   Muscle cramps - Primary    Ongoing-worse on Tuesday after a big exercise day  Suspect this may have been the cause  Flexeril is helpful at night (since most of cramps are nocturnal) Disc stretching  Lab today incl chem/ TSH / CPK and ESR (since she c/o of shoulder girdle soreness as  well) Nl exam today       Relevant Orders   Sedimentation Rate (Completed)   CK (Completed)   Renal function panel (Completed)   TSH (Completed)

## 2018-04-15 NOTE — Patient Instructions (Signed)
Continue 5 mg of amlodipine   For cramping -labs today   Continue flexeril as needed   If chest symptoms continue or worsen- alert me EKG looks good today

## 2018-04-15 NOTE — Assessment & Plan Note (Signed)
Clinically stable No symptoms

## 2018-04-15 NOTE — Assessment & Plan Note (Signed)
Pt sees endocrinology In light of worse muscle cramping TSH checked today  Currently taking 50 mcg of levothyroxine

## 2018-04-15 NOTE — Assessment & Plan Note (Signed)
Ongoing-worse on Tuesday after a big exercise day  Suspect this may have been the cause  Flexeril is helpful at night (since most of cramps are nocturnal) Disc stretching  Lab today incl chem/ TSH / CPK and ESR (since she c/o of shoulder girdle soreness as well) Nl exam today

## 2018-04-22 DIAGNOSIS — E038 Other specified hypothyroidism: Secondary | ICD-10-CM | POA: Diagnosis not present

## 2018-05-27 ENCOUNTER — Telehealth: Payer: Self-pay

## 2018-05-27 NOTE — Telephone Encounter (Signed)
Pt called and endo advised pt to contact PCP about muscle spasms and aching. Endo took pt off of levothyroxine today but does not think SE of levothyroxine. Pt said arm and leg muscles are aching and spasming. Pt has weakness in both calves of legs. When pt was doing yoga had spasming. Pt having spasms in arms and legs for a very long time but has worsened over last month. Pt request [email or web address]. Pt last seen 04/15/18. Pt said cyclobenzaprine does help the aching and spasming but spasms seem different than usual.

## 2018-05-27 NOTE — Telephone Encounter (Signed)
The weakness is new.  Please have her f/u to disc muscle cramps and weakness.  Do continue to stay off the thyroid medicine in the meantime just to see if it helps  I think her endocrinologist was also going to get some thyroid antibody tests

## 2018-05-28 ENCOUNTER — Ambulatory Visit (INDEPENDENT_AMBULATORY_CARE_PROVIDER_SITE_OTHER): Payer: Medicare Other | Admitting: Family Medicine

## 2018-05-28 ENCOUNTER — Other Ambulatory Visit: Payer: Self-pay

## 2018-05-28 ENCOUNTER — Encounter: Payer: Self-pay | Admitting: Family Medicine

## 2018-05-28 VITALS — BP 132/70 | HR 77 | Temp 98.1°F | Ht 64.25 in | Wt 155.4 lb

## 2018-05-28 DIAGNOSIS — E039 Hypothyroidism, unspecified: Secondary | ICD-10-CM | POA: Diagnosis not present

## 2018-05-28 DIAGNOSIS — I10 Essential (primary) hypertension: Secondary | ICD-10-CM

## 2018-05-28 DIAGNOSIS — R252 Cramp and spasm: Secondary | ICD-10-CM | POA: Diagnosis not present

## 2018-05-28 DIAGNOSIS — M810 Age-related osteoporosis without current pathological fracture: Secondary | ICD-10-CM | POA: Diagnosis not present

## 2018-05-28 LAB — HEPATIC FUNCTION PANEL
ALT: 11 U/L (ref 0–35)
AST: 16 U/L (ref 0–37)
Albumin: 4.3 g/dL (ref 3.5–5.2)
Alkaline Phosphatase: 72 U/L (ref 39–117)
Bilirubin, Direct: 0.1 mg/dL (ref 0.0–0.3)
Total Bilirubin: 0.6 mg/dL (ref 0.2–1.2)
Total Protein: 7.6 g/dL (ref 6.0–8.3)

## 2018-05-28 LAB — MAGNESIUM: Magnesium: 2.2 mg/dL (ref 1.5–2.5)

## 2018-05-28 LAB — VITAMIN D 25 HYDROXY (VIT D DEFICIENCY, FRACTURES): VITD: 38.88 ng/mL (ref 30.00–100.00)

## 2018-05-28 NOTE — Telephone Encounter (Signed)
Pt notified of Dr. Marliss Coots comments and verbalized understanding. F/u appt scheduled today

## 2018-05-28 NOTE — Progress Notes (Signed)
Subjective:    Patient ID: Susan Woods, female    DOB: [birth year], 73 y.o.   MRN: [id number]  HPI Here for f/u of muscle cramping and weakness Cramps /muscle spasms have worsened over the past month   Chest and axillary symptoms are better   Calves and posterior upper arms are tight and sore  Feels like fingers are crampy (? Weak-hard to tell)  Dorsi flexing feet helps for calf cramps at night   Does not feel like she has lost strength anywhere   She backed off of step class Doing water aerobics 2 times per week  Yoga Cardio dance     Wt Readings from Last 3 Encounters:  05/28/18 155 lb 6 oz (70.5 kg)  01/25/18 152 lb 8 oz (69.2 kg)  01/11/18 151 lb 8 oz (68.7 kg)   26.46 kg/m    Seeing Dr Honor Junes for her thyroid Lab Results  Component Value Date   TSH <0.01 (L) 04/15/2018   off levothyroxine starting today  Will wait until June to re check   Last labs Results for orders placed or performed in visit on 04/15/18  Sedimentation Rate  Result Value Ref Range   Sed Rate 25 0 - 30 mm/hr  CK  Result Value Ref Range   Total CK 58 7 - 177 U/L  Renal function panel  Result Value Ref Range   Sodium 136 135 - 145 mEq/L   Potassium 3.7 3.5 - 5.1 mEq/L   Chloride 100 96 - 112 mEq/L   CO2 30 19 - 32 mEq/L   Calcium 9.6 8.4 - 10.5 mg/dL   Albumin 4.2 3.5 - 5.2 g/dL   BUN 12 6 - 23 mg/dL   Creatinine, Ser 0.75 0.40 - 1.20 mg/dL   Glucose, Bld 83 70 - 99 mg/dL   Phosphorus 3.6 2.3 - 4.6 mg/dL   GFR 92.30 >60.00 mL/min  TSH  Result Value Ref Range   TSH <0.01 (L) 0.35 - 4.50 uIU/mL    Last vit D level was 39 in 2014   Tx: Flexeril- does help at night  Supplements  Some magnesium (caution of diarrhea)  Epsom salts bath  Taking her ca and D Eating some mustard   bp is stable today  No cp or palpitations or headaches or edema  No side effects to medicines  BP Readings from Last 3 Encounters:  05/28/18 (!) 144/92  04/15/18 128/80  01/25/18  140/70    Re check 132/70 at rest    Patient Active Problem List   Diagnosis Date Noted  . Muscle cramps 04/15/2018  . Internal hemorrhoids 04/27/2016  . Hypothyroid 01/13/2016  . 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 Colon cancer screening 12/22/2014  . Joint pain 08/26/2014  . Low back pain 05/18/2014  . Hypertensive retinopathy of both eyes, grade 1 12/16/2013  . Encounter for Medicare annual wellness exam 12/31/2012  . Routine general medical examination at a health care facility 02/20/2011  . MENINGIOMA 11/17/2009  . Osteoporosis 03/19/2007  . HYPOGLYCEMIA 06/12/2006  . Essential hypertension 06/12/2006  . Allergic rhinitis 06/12/2006  . GERD 06/12/2006   Past Medical History:  Diagnosis Date  . Anemia   . GERD (gastroesophageal reflux disease)   . HTN (hypertension)    echo (8/11): EF 55%, mild MR, mild TR  . Meningioma ([place])    small; advised no f/u unless symptomatic by Dr. Loletta Specter 8/11  . Nasal pruritis    full body w/o rash   .  Nonallergic rhinitis   . Osteoporosis   . Psoriasis    mild intermittent   . Tingling    in hands of ? etiol   Past Surgical History:  Procedure Laterality Date  . 2D echo  8/11   nml   . ABDOMINAL HYSTERECTOMY    . ABDOMINAL SURGERY     adhesions  . CESAREAN SECTION    . COLONOSCOPY  2006  . dexa - osteopenia  2005   dexa 2009 - osteoporosis/fairly stable   . hosp TIA  8/11  . NASAL SINUS SURGERY  2002  . OOPHORECTOMY    . ORIF FINGER / THUMB FRACTURE  12/06  . small meningioma    . urethral stricutre     dilated  . VESICOVAGINAL FISTULA CLOSURE W/ TAH     Social History   Tobacco Use  . Smoking status: Former Smoker    Last attempt to quit: 04/08/1987    Years since quitting: 31.1  . Smokeless tobacco: Never Used  Substance Use Topics  . Alcohol use: Yes    Alcohol/week: 0.0 standard drinks    Comment: rarely  . Drug use: No   Family History  Problem Relation Age of Onset  . Cancer Father        throat CA  . Diabetes  Father   . Heart disease Mother        MI late 23s - CHF   . Hypertension Mother   . Stroke Brother   . Diabetes Brother   . Heart disease Sister        CHF  . Diabetes Sister   . Hypertension Brother   . Diabetes Brother    Allergies  Allergen Reactions  . Amlodipine Swelling    Ankle swelling  . Indomethacin     REACTION: swelling, rash  . Levaquin [Levofloxacin In D5w] Other (See Comments)    Muscle pain   . Penicillins     REACTION: urticaria (hives)  . Sulfonamide Derivatives     REACTION: tingling around mouth and hands felt tight  . Zithromax [Azithromycin]   . Povidone-Iodine Hives and Itching   Current Outpatient Medications on File Prior to Visit  Medication Sig Dispense Refill  . amLODipine (NORVASC) 5 MG tablet Take 1 tablet (5 mg total) by mouth daily. 90 tablet 3  . Calcium Carb-Cholecalciferol (CALCIUM 600 + D PO) Take 1 tablet by mouth daily.     . Cholecalciferol (VITAMIN D3) 1000 UNITS CAPS Take 1 capsule by mouth daily.      . cyclobenzaprine (FLEXERIL) 10 MG tablet TAKE 1/2-1 TABLET BY MOUTH 3 TIMES DAILY AS NEEDED FOR MUSCLE SPASMS (WATCH FOR SEDATION) 30 tablet 3  . Ferrous Sulfate (FE-CAPS) 250 MG CPCR Take 1 capsule by mouth daily.      . fish oil-omega-3 fatty acids 1000 MG capsule Take 2 g by mouth 2 (two) times daily. Alternate with flax oil    . fluticasone (FLONASE) 50 MCG/ACT nasal spray Place 2 sprays into both nostrils daily. 48 g 3  . hydrochlorothiazide (HYDRODIURIL) 25 MG tablet TAKE 1 TABLET BY MOUTH DAILY 90 tablet 1  . hydrocortisone (ANUSOL-HC) 2.5 % rectal cream Place 1 application rectally at bedtime. 30 g 1  . levocetirizine (XYZAL) 5 MG tablet TAKE 1 TABLET BY MOUTH  DAILY AS NEEDED 90 tablet 3  . losartan (COZAAR) 100 MG tablet Take 1 tablet (100 mg total) by mouth daily. 90 tablet 3  . metoprolol succinate (TOPROL-XL) 100 MG  24 hr tablet Take with or immediately following a meal. 90 tablet 3  . Multiple Vitamin (MULTIVITAMIN)  tablet Take 1 tablet by mouth daily.      . NON FORMULARY similisan dry eye drops and ear drops. UAD PRN      No current facility-administered medications on file prior to visit.     Review of Systems  Constitutional: Negative for activity change, appetite change, fatigue, fever and unexpected weight change.  HENT: Negative for congestion, ear pain, rhinorrhea, sinus pressure and sore throat.   Eyes: Negative for pain, redness and visual disturbance.  Respiratory: Negative for cough, shortness of breath and wheezing.   Cardiovascular: Negative for chest pain and palpitations.  Gastrointestinal: Negative for abdominal pain, blood in stool, constipation and diarrhea.  Endocrine: Negative for polydipsia and polyuria.  Genitourinary: Negative for dysuria, frequency and urgency.  Musculoskeletal: Positive for back pain and myalgias. Negative for arthralgias, gait problem and joint swelling.       Muscle cramps - hands/feet   Skin: Negative for pallor and rash.  Allergic/Immunologic: Negative for environmental allergies.  Neurological: Negative for dizziness, syncope and headaches.  Hematological: Negative for adenopathy. Does not bruise/bleed easily.  Psychiatric/Behavioral: Negative for decreased concentration and dysphoric mood. The patient is not nervous/anxious.        Objective:   Physical Exam Constitutional:      General: She is not in acute distress.    Appearance: Normal appearance. She is well-developed and normal weight. She is not ill-appearing.  HENT:     Head: Normocephalic and atraumatic.     Mouth/Throat:     Mouth: Mucous membranes are moist.     Pharynx: Oropharynx is clear.  Eyes:     General: No scleral icterus.    Conjunctiva/sclera: Conjunctivae normal.     Pupils: Pupils are equal, round, and reactive to light.  Neck:     Musculoskeletal: Normal range of motion and neck supple.     Thyroid: No thyromegaly.     Vascular: No carotid bruit or JVD.   Cardiovascular:     Rate and Rhythm: Normal rate and regular rhythm.     Heart sounds: Normal heart sounds. No gallop.   Pulmonary:     Effort: Pulmonary effort is normal. No respiratory distress.     Breath sounds: Normal breath sounds. No stridor. No wheezing or rales.  Abdominal:     General: Bowel sounds are normal. There is no distension or abdominal bruit.     Palpations: Abdomen is soft. There is no mass.     Tenderness: There is no abdominal tenderness.  Musculoskeletal:        General: Tenderness present. No swelling, deformity or signs of injury.     Right lower leg: No edema.     Left lower leg: No edema.     Comments: Slight tenderness in upper arms over deltoid muscles Nl rom all joints No acute joint changes No kyphosis   Lymphadenopathy:     Cervical: No cervical adenopathy.  Skin:    General: Skin is warm and dry.     Findings: No rash.  Neurological:     General: No focal deficit present.     Mental Status: She is alert.     Cranial Nerves: No cranial nerve deficit.     Motor: No weakness.     Coordination: Coordination normal.     Gait: Gait normal.     Deep Tendon Reflexes: Reflexes are normal and symmetric. Reflexes  normal.  Psychiatric:        Mood and Affect: Mood normal.           Assessment & Plan:   Problem List Items Addressed This Visit      Cardiovascular and Mediastinum   Essential hypertension    bp in fair control at this time  BP Readings from Last 1 Encounters:  05/28/18 132/70   No changes needed Most recent labs reviewed  Disc lifstyle change with low sodium diet and exercise        Relevant Orders   Hepatic function panel     Endocrine   Hypothyroid    Lab Results  Component Value Date   TSH <0.01 (L) 04/15/2018   Her endocrinologist stopped thyroid supplementation  To re check in June Hopefully this will help some of her muscle symptoms         Musculoskeletal and Integument   Osteoporosis    Vit D level  today      Relevant Orders   VITAMIN D 25 Hydroxy (Vit-D Deficiency, Fractures)     Other   Muscle cramps - Primary    Ongoing cramps with residual soreness-worse in past month  Some of her pain in chest/sides however has improved Endocrinology stopped her levothy yesterday for low tsh and I expect this will help Rev last reassuring labs Today will check magnesium, liver profile and vit D levels  Enc stretching Flexeril in pm if needed  inst to update if symptoms worsen       Relevant Orders   Magnesium   Hepatic function panel

## 2018-05-28 NOTE — Assessment & Plan Note (Signed)
Lab Results  Component Value Date   TSH <0.01 (L) 04/15/2018   Her endocrinologist stopped thyroid supplementation  To re check in June Hopefully this will help some of her muscle symptoms

## 2018-05-28 NOTE — Assessment & Plan Note (Signed)
Vit D level today 

## 2018-05-28 NOTE — Assessment & Plan Note (Signed)
Ongoing cramps with residual soreness-worse in past month  Some of her pain in chest/sides however has improved Endocrinology stopped her levothy yesterday for low tsh and I expect this will help Rev last reassuring labs Today will check magnesium, liver profile and vit D levels  Enc stretching Flexeril in pm if needed  inst to update if symptoms worsen

## 2018-05-28 NOTE — Assessment & Plan Note (Signed)
bp in fair control at this time  BP Readings from Last 1 Encounters:  05/28/18 132/70   No changes needed Most recent labs reviewed  Disc lifstyle change with low sodium diet and exercise

## 2018-05-28 NOTE — Patient Instructions (Signed)
Labs today for muscle complaints  Vitamin D Magnesium level Liver profile   Hopefully things will improve off the thyroid medicine   Keep me posted

## 2018-07-30 ENCOUNTER — Other Ambulatory Visit: Payer: Self-pay | Admitting: Family Medicine

## 2018-08-29 DIAGNOSIS — E038 Other specified hypothyroidism: Secondary | ICD-10-CM | POA: Diagnosis not present

## 2018-09-05 ENCOUNTER — Telehealth: Payer: Self-pay | Admitting: Family Medicine

## 2018-09-05 DIAGNOSIS — E038 Other specified hypothyroidism: Secondary | ICD-10-CM | POA: Diagnosis not present

## 2018-09-05 NOTE — Telephone Encounter (Signed)
Best number [phone]  Pt called to let you know  Pt just talk to endocrinology  stating her labs were good.  And she has been off meds since March.

## 2018-09-24 DIAGNOSIS — H35372 Puckering of macula, left eye: Secondary | ICD-10-CM | POA: Diagnosis not present

## 2018-09-24 DIAGNOSIS — H43392 Other vitreous opacities, left eye: Secondary | ICD-10-CM | POA: Diagnosis not present

## 2018-09-24 DIAGNOSIS — H33192 Other retinoschisis and retinal cysts, left eye: Secondary | ICD-10-CM | POA: Diagnosis not present

## 2018-09-24 DIAGNOSIS — H43813 Vitreous degeneration, bilateral: Secondary | ICD-10-CM | POA: Diagnosis not present

## 2018-10-21 ENCOUNTER — Other Ambulatory Visit: Payer: Self-pay | Admitting: Family Medicine

## 2018-11-01 ENCOUNTER — Telehealth: Payer: Self-pay

## 2018-11-01 MED ORDER — HYDROCORTISONE (PERIANAL) 2.5 % EX CREA: 1.0000 "application " | TOPICAL_CREAM | Freq: Two times a day (BID) | CUTANEOUS | 0 refills | Status: DC

## 2018-11-01 NOTE — Telephone Encounter (Signed)
I sent it  Alert [place] and f/u if worse or no improvement  Try to keep stools soft Colace or miralax would also help

## 2018-11-01 NOTE — Telephone Encounter (Signed)
Patient advised.

## 2018-11-01 NOTE — Telephone Encounter (Signed)
Patient states she is having issue with hemorrhoids again. She was prescribed Anusol cream in 2018 and is having same issue again. For the past 2 days noticed some blood on the toilet paper after bowel movement. No blood in the toilet water, no itching or pain or burning sensation. Her stools have been harder than usual but this is better now since she has been drinking Prune juice. Patient would like a refill on the suppository cream

## 2019-01-15 ENCOUNTER — Encounter: Payer: Self-pay | Admitting: Family Medicine

## 2019-01-15 DIAGNOSIS — Z1231 Encounter for screening mammogram for malignant neoplasm of breast: Secondary | ICD-10-CM | POA: Diagnosis not present

## 2019-01-16 ENCOUNTER — Ambulatory Visit (INDEPENDENT_AMBULATORY_CARE_PROVIDER_SITE_OTHER): Payer: Medicare Other | Admitting: Family Medicine

## 2019-01-16 ENCOUNTER — Other Ambulatory Visit: Payer: Self-pay

## 2019-01-16 ENCOUNTER — Encounter: Payer: Self-pay | Admitting: Family Medicine

## 2019-01-16 VITALS — BP 130/80 | HR 64 | Temp 97.3°F | Ht 64.0 in | Wt 156.1 lb

## 2019-01-16 DIAGNOSIS — I10 Essential (primary) hypertension: Secondary | ICD-10-CM | POA: Diagnosis not present

## 2019-01-16 DIAGNOSIS — E039 Hypothyroidism, unspecified: Secondary | ICD-10-CM | POA: Diagnosis not present

## 2019-01-16 DIAGNOSIS — Z1211 Encounter for screening for malignant neoplasm of colon: Secondary | ICD-10-CM | POA: Diagnosis not present

## 2019-01-16 DIAGNOSIS — Z0000 Encounter for general adult medical examination without abnormal findings: Secondary | ICD-10-CM | POA: Diagnosis not present

## 2019-01-16 DIAGNOSIS — M810 Age-related osteoporosis without current pathological fracture: Secondary | ICD-10-CM | POA: Diagnosis not present

## 2019-01-16 DIAGNOSIS — K648 Other hemorrhoids: Secondary | ICD-10-CM

## 2019-01-16 DIAGNOSIS — H35033 Hypertensive retinopathy, bilateral: Secondary | ICD-10-CM

## 2019-01-16 DIAGNOSIS — R252 Cramp and spasm: Secondary | ICD-10-CM

## 2019-01-16 LAB — LIPID PANEL
Cholesterol: 162 mg/dL (ref 0–200)
HDL: 46.6 mg/dL (ref >39.00)
LDL Cholesterol: 95 mg/dL (ref 0–99)
NonHDL: 115.26
Total CHOL/HDL Ratio: 3
Triglycerides: 99 mg/dL (ref 0.0–149.0)
VLDL: 19.8 mg/dL (ref 0.0–40.0)

## 2019-01-16 LAB — CBC WITH DIFFERENTIAL/PLATELET
Basophils Absolute: 0 10*3/uL (ref 0.0–0.1)
Basophils Relative: 0.5 % (ref 0.0–3.0)
Eosinophils Absolute: 0.1 10*3/uL (ref 0.0–0.7)
Eosinophils Relative: 2.3 % (ref 0.0–5.0)
HCT: 37 % (ref 36.0–46.0)
Hemoglobin: 12.3 g/dL (ref 12.0–15.0)
Lymphocytes Relative: 35.2 % (ref 12.0–46.0)
Lymphs Abs: 2.1 10*3/uL (ref 0.7–4.0)
MCHC: 33.3 g/dL (ref 30.0–36.0)
MCV: 90.1 fl (ref 78.0–100.0)
Monocytes Absolute: 0.3 10*3/uL (ref 0.1–1.0)
Monocytes Relative: 5.6 % (ref 3.0–12.0)
Neutro Abs: 3.3 10*3/uL (ref 1.4–7.7)
Neutrophils Relative %: 56.4 % (ref 43.0–77.0)
Platelets: 226 10*3/uL (ref 150.0–400.0)
RBC: 4.11 Mil/uL (ref 3.87–5.11)
RDW: 14.5 % (ref 11.5–15.5)
WBC: 5.9 10*3/uL (ref 4.0–10.5)

## 2019-01-16 LAB — COMPREHENSIVE METABOLIC PANEL
ALT: 9 U/L (ref 0–35)
AST: 17 U/L (ref 0–37)
Albumin: 4.4 g/dL (ref 3.5–5.2)
Alkaline Phosphatase: 68 U/L (ref 39–117)
BUN: 9 mg/dL (ref 6–23)
CO2: 30 mEq/L (ref 19–32)
Calcium: 9.3 mg/dL (ref 8.4–10.5)
Chloride: 103 mEq/L (ref 96–112)
Creatinine, Ser: 0.78 mg/dL (ref 0.40–1.20)
GFR: 88.03 mL/min (ref >60.00)
Glucose, Bld: 104 mg/dL — ABNORMAL HIGH (ref 70–99)
Potassium: 3.8 mEq/L (ref 3.5–5.1)
Sodium: 141 mEq/L (ref 135–145)
Total Bilirubin: 0.6 mg/dL (ref 0.2–1.2)
Total Protein: 7.1 g/dL (ref 6.0–8.3)

## 2019-01-16 LAB — TSH: TSH: 0.67 u[IU]/mL (ref 0.35–4.50)

## 2019-01-16 LAB — VITAMIN D 25 HYDROXY (VIT D DEFICIENCY, FRACTURES): VITD: 41.4 ng/mL (ref 30.00–100.00)

## 2019-01-16 MED ORDER — HYDROCHLOROTHIAZIDE 25 MG PO TABS: 25.0000 mg | ORAL_TABLET | Freq: Every day | ORAL | 3 refills | Status: DC

## 2019-01-16 MED ORDER — METOPROLOL SUCCINATE ER 100 MG PO TB24: ORAL_TABLET | ORAL | 3 refills | Status: DC

## 2019-01-16 MED ORDER — LOSARTAN POTASSIUM 100 MG PO TABS: 100.0000 mg | ORAL_TABLET | Freq: Every day | ORAL | 3 refills | Status: DC

## 2019-01-16 MED ORDER — AMLODIPINE BESYLATE 5 MG PO TABS: 5.0000 mg | ORAL_TABLET | Freq: Every day | ORAL | 3 refills | Status: DC

## 2019-01-16 NOTE — Assessment & Plan Note (Signed)
Pt has had some mild rectal bleeding on and off  The steroid cream helps Avoids straining  Small ext hemorrhoid on exam today  Last colonoscopy 2017 If this worsens would consider proceedure

## 2019-01-16 NOTE — Assessment & Plan Note (Signed)
Hypothyroidism  Pt has no clinical changes No change in energy level/ hair or skin/ edema and no tremor Sees endocrinology and no longer on levothyroxine  TSH today

## 2019-01-16 NOTE — Assessment & Plan Note (Signed)
Has eye exam planned for tomorrow No vision changes per pt

## 2019-01-16 NOTE — Assessment & Plan Note (Signed)
Much improved since thyroid problem has resolved

## 2019-01-16 NOTE — Assessment & Plan Note (Signed)
Colonoscopy 2017 Had ifob kit in 2018 from ins [facility]

## 2019-01-16 NOTE — Patient Instructions (Signed)
If you are interested in the new shingles vaccine (Shingrix) - call your local pharmacy to check on coverage and availability  If affordable, get on a wait list at your pharmacy to get the vaccine.

## 2019-01-16 NOTE — Assessment & Plan Note (Signed)
Reviewed health habits including diet and exercise and skin cancer prevention Reviewed appropriate screening tests for age  Also reviewed health mt list, fam hx and immunization status , as well as social and family history   See HPI Labs reviewed  Mammogram was done yesterday at [facility]  Disc shingrix vaccine  Declines dexa (no falls or fx)  Advance directive is utd No cognitive concerns Hearing screen is normal  Eye exam planned tomorrow

## 2019-01-16 NOTE — Assessment & Plan Note (Signed)
bp in fair control at this time  BP Readings from Last 1 Encounters:  01/16/19 130/80   No changes needed Most recent labs reviewed  Disc lifstyle change with low sodium diet and exercise

## 2019-01-16 NOTE — Progress Notes (Signed)
Subjective:    Patient ID: Susan Woods, female    DOB: 1945/03/27, 73 y.o.   MRN: [id number]  HPI  Here for amw and health mt exam with review of chronic medical problems   I have personally reviewed the Medicare Annual Wellness questionnaire and have noted 1. The patient's medical and social history 2. Their use of alcohol, tobacco or illicit drugs 3. Their current medications and supplements 4. The patient's functional ability including ADL's, fall risks, home safety risks and hearing or visual             impairment. 5. Diet and physical activities 6. Evidence for depression or mood disorders  The patients weight, height, BMI have been recorded in the chart and visual acuity is per eye clinic.  I have made referrals, counseling and provided education to the patient based review of the above and I have provided the pt with a written personalized care plan for preventive services. Reviewed and updated provider list, see scanned forms.  See scanned forms.  Routine anticipatory guidance given to patient.  See health maintenance. Colon cancer screening ifob kit 6/18    Colonoscopy 2017 (normal)  Breast cancer screening mammogram -had it yesterday / was emailed ([facility]) - they will send [place] a copy  Self breast exam - no self exam  She has had a hysterectomy  Having trouble with her hemorrhoids lately- gets a little blood (has proctosol cream prn and it helps) - she tries to avoid straining or wiping too hard  Flu vaccine -had already this fall  Tetanus vaccine 10/17 Tdap Pneumovax completed  Zoster vaccine  1/13 zostavax  She is interested in the shingrix vaccine at all  Dexa 11/14 OP -she does not want to do it /would not treat it  Intolerant to oral alendronate in the past (? What side effect)  /dentist told her to stop it  Falls- none  [employer]- taking ca and D3  Exercise -lots   Advance directive-has a living will poa  (we have a copy)   Cognitive function addressed- see scanned forms- and if abnormal then additional documentation follows. No concerns with memory or concentration  She takes care of own finances/etc    Feels good overall   More aches and pains No fever at all  Days when she has to take some tylenol   PMH and SH reviewed  Meds, vitals, and allergies reviewed.   ROS: See HPI.  Otherwise negative.    Weight : Wt Readings from Last 3 Encounters:  01/16/19 156 lb 2 oz (70.8 kg)  05/28/18 155 lb 6 oz (70.5 kg)  01/25/18 152 lb 8 oz (69.2 kg)  stable -takes care of herself  Working out a lot - more muscle mass  26.80 kg/m   Hearing/vision:  Hearing Screening   125Hz 250Hz 500Hz 1000Hz 2000Hz 3000Hz 4000Hz 6000Hz 8000Hz  Right ear:   53 40 40  40    Left ear:   56 40 49  40    Vision Screening Comments: Pt [facility] exam [facility], next eye exam is 01/17/19 eye exam tomorrow    Hypertension   (h/o hypertensive eye dz) bp is stable today  127/71 at her home this am  No cp or palpitations or headaches or edema  No side effects to medicines  BP Readings from Last 3 Encounters:  01/16/19 (!) 146/90  05/28/18 132/70  04/15/18 128/80    She is due for labs  Hypothyroidism in the past Her endocrinologist stopped thyroid suppl Pt has no clinical changes No change in energy level/ hair or skin/ edema and no tremor Lab Results  Component Value Date   TSH <0.01 (L) 04/15/2018     Had endocrinology televisit in June= labs were normal then  Has labs end of dec and then appt in jan  Leg cramps are so much better   Patient Active Problem List   Diagnosis Date Noted  . Muscle cramps 04/15/2018  . Internal hemorrhoids 04/27/2016  . Hypothyroid 01/13/2016  . 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 Colon cancer screening 12/22/2014  . Joint pain 08/26/2014  . Low back pain 05/18/2014  . Hypertensive retinopathy of both eyes, grade 1 12/16/2013  . Encounter for Medicare annual wellness exam  12/31/2012  . Routine general medical examination at a health care facility 02/20/2011  . MENINGIOMA 11/17/2009  . Osteoporosis 03/19/2007  . HYPOGLYCEMIA 06/12/2006  . Essential hypertension 06/12/2006  . Allergic rhinitis 06/12/2006  . GERD 06/12/2006   Past Medical History:  Diagnosis Date  . Anemia   . GERD (gastroesophageal reflux disease)   . HTN (hypertension)    echo (8/11): EF 55%, mild MR, mild TR  . Meningioma (HCC)    small; advised no f/u unless symptomatic by Dr. Clarke 8/11  . Nasal pruritis    full body w/o rash   . Nonallergic rhinitis   . Osteoporosis   . Psoriasis    mild intermittent   . Tingling    in hands of ? etiol   Past Surgical History:  Procedure Laterality Date  . 2D echo  8/11   nml   . ABDOMINAL HYSTERECTOMY    . ABDOMINAL SURGERY     adhesions  . CESAREAN SECTION    . COLONOSCOPY  2006  . dexa - osteopenia  2005   dexa 2009 - osteoporosis/fairly stable   . hosp TIA  8/11  . NASAL SINUS SURGERY  2002  . OOPHORECTOMY    . ORIF FINGER / THUMB FRACTURE  12/06  . small meningioma    . urethral stricutre     dilated  . VESICOVAGINAL FISTULA CLOSURE W/ TAH     Social History   Tobacco Use  . Smoking status: Former Smoker    Quit date: 04/08/1987    Years since quitting: 31.7  . Smokeless tobacco: Never Used  Substance Use Topics  . Alcohol use: Yes    Alcohol/week: 0.0 standard drinks    Comment: rarely  . Drug use: No   Family History  Problem Relation Age of Onset  . Cancer Father        throat CA  . Diabetes Father   . Heart disease Mother        MI late 50s - CHF   . Hypertension Mother   . Stroke Brother   . Diabetes Brother   . Heart disease Sister        CHF  . Diabetes Sister   . Hypertension Brother   . Diabetes Brother    Allergies  Allergen Reactions  . Amlodipine Swelling    Ankle swelling  . Indomethacin     REACTION: swelling, rash  . Levaquin [Levofloxacin In D5w] Other (See Comments)    Muscle  pain   . Penicillins     REACTION: urticaria (hives)  . Sulfonamide Derivatives     REACTION: tingling around mouth and hands felt tight  . Zithromax [Azithromycin]   . Povidone-Iodine Hives   and Itching   Current Outpatient Medications on File Prior to Visit  Medication Sig Dispense Refill  . Calcium Carb-Cholecalciferol (CALCIUM 600 + D PO) Take 1 tablet by mouth daily.     . Cholecalciferol (VITAMIN D3) 1000 UNITS CAPS Take 1 capsule by mouth daily.      . cyclobenzaprine (FLEXERIL) 10 MG tablet TAKE 1/2-1 TABLET BY MOUTH 3 TIMES DAILY AS NEEDED FOR MUSCLE SPASMS (WATCH FOR SEDATION) 30 tablet 3  . Ferrous Sulfate (FE-CAPS) 250 MG CPCR Take 1 capsule by mouth daily.      . fish oil-omega-3 fatty acids 1000 MG capsule Take 2 g by mouth 2 (two) times daily. Alternate with flax oil    . fluticasone (FLONASE) 50 MCG/ACT nasal spray Place 2 sprays into both nostrils daily. 48 g 3  . hydrocortisone (PROCTOSOL HC) 2.5 % rectal cream Place 1 application rectally 2 (two) times daily. 30 g 0  . levocetirizine (XYZAL) 5 MG tablet TAKE 1 TABLET BY MOUTH  DAILY AS NEEDED 90 tablet 3  . Multiple Vitamin (MULTIVITAMIN) tablet Take 1 tablet by mouth daily.      . NON FORMULARY similisan dry eye drops and ear drops. UAD PRN      No current facility-administered medications on file prior to visit.     Review of Systems  Constitutional: Negative for activity change, appetite change, fatigue, fever and unexpected weight change.  HENT: Negative for congestion, ear pain, rhinorrhea, sinus pressure and sore throat.   Eyes: Negative for pain, redness and visual disturbance.  Respiratory: Negative for cough, shortness of breath and wheezing.   Cardiovascular: Negative for chest pain and palpitations.  Gastrointestinal: Negative for abdominal pain, blood in stool, constipation, diarrhea and rectal pain.       Occ rectal bleeding from hemorrhoids   Endocrine: Negative for polydipsia and polyuria.   Genitourinary: Negative for dysuria, frequency and urgency.  Musculoskeletal: Negative for arthralgias, back pain and myalgias.  Skin: Negative for pallor and rash.  Allergic/Immunologic: Negative for environmental allergies.  Neurological: Negative for dizziness, syncope and headaches.  Hematological: Negative for adenopathy. Does not bruise/bleed easily.  Psychiatric/Behavioral: Negative for decreased concentration and dysphoric mood. The patient is not nervous/anxious.        Objective:   Physical Exam Constitutional:      General: She is not in acute distress.    Appearance: Normal appearance. She is well-developed and normal weight. She is not ill-appearing or diaphoretic.  HENT:     Head: Normocephalic and atraumatic.     Right Ear: Tympanic membrane, ear canal and external ear normal.     Left Ear: Tympanic membrane, ear canal and external ear normal.     Nose: Nose normal. No congestion.     Mouth/Throat:     Mouth: Mucous membranes are moist.     Pharynx: Oropharynx is clear. No posterior oropharyngeal erythema.  Eyes:     General: No scleral icterus.    Extraocular Movements: Extraocular movements intact.     Conjunctiva/sclera: Conjunctivae normal.     Pupils: Pupils are equal, round, and reactive to light.  Neck:     Musculoskeletal: Normal range of motion and neck supple. No neck rigidity or muscular tenderness.     Thyroid: No thyromegaly.     Vascular: No carotid bruit or JVD.  Cardiovascular:     Rate and Rhythm: Normal rate and regular rhythm.     Pulses: Normal pulses.     Heart sounds: Normal heart sounds.   No gallop.   Pulmonary:     Effort: Pulmonary effort is normal. No respiratory distress.     Breath sounds: Normal breath sounds. No wheezing.     Comments: Good air exch Chest:     Chest wall: No tenderness.  Abdominal:     General: Bowel sounds are normal. There is no distension or abdominal bruit.     Palpations: Abdomen is soft. There is no mass.      Tenderness: There is no abdominal tenderness.     Hernia: No hernia is present.  Genitourinary:    Comments: Breast exam: No mass, nodules, thickening, tenderness, bulging, retraction, inflamation, nipple discharge or skin changes noted.  No axillary or clavicular LA.     Musculoskeletal: Normal range of motion.        General: No tenderness.     Right lower leg: No edema.     Left lower leg: No edema.  Lymphadenopathy:     Cervical: No cervical adenopathy.  Skin:    General: Skin is warm and dry.     Coloration: Skin is not pale.     Findings: No erythema or rash.     Comments: Few skin tags  Neurological:     Mental Status: She is alert. Mental status is at baseline.     Cranial Nerves: No cranial nerve deficit.     Motor: No abnormal muscle tone.     Coordination: Coordination normal.     Gait: Gait normal.     Deep Tendon Reflexes: Reflexes are normal and symmetric. Reflexes normal.  Psychiatric:        Mood and Affect: Mood normal.        Cognition and Memory: Cognition and memory normal.           Assessment & Plan:   Problem List Items Addressed This Visit      Cardiovascular and Mediastinum   Essential hypertension    bp in fair control at this time  BP Readings from Last 1 Encounters:  01/16/19 130/80   No changes needed Most recent labs reviewed  Disc lifstyle change with low sodium diet and exercise        Relevant Medications   amLODipine (NORVASC) 5 MG tablet   hydrochlorothiazide (HYDRODIURIL) 25 MG tablet   losartan (COZAAR) 100 MG tablet   metoprolol succinate (TOPROL-XL) 100 MG 24 hr tablet   Other Relevant Orders   CBC w/Diff (Completed)   Comprehensive metabolic panel (Completed)   Lipid panel (Completed)   TSH (Completed)   Internal hemorrhoids    Pt has had some mild rectal bleeding on and off  The steroid cream helps Avoids straining  Small ext hemorrhoid on exam today  Last colonoscopy 2017 If this worsens would consider  proceedure      Relevant Medications   amLODipine (NORVASC) 5 MG tablet   hydrochlorothiazide (HYDRODIURIL) 25 MG tablet   losartan (COZAAR) 100 MG tablet   metoprolol succinate (TOPROL-XL) 100 MG 24 hr tablet     Endocrine   Hypothyroid    Hypothyroidism  Pt has no clinical changes No change in energy level/ hair or skin/ edema and no tremor Sees endocrinology and no longer on levothyroxine  TSH today      Relevant Medications   metoprolol succinate (TOPROL-XL) 100 MG 24 hr tablet   Other Relevant Orders   TSH (Completed)     Musculoskeletal and Integument   Osteoporosis    dexa 11/14  Declines another because   she would decline any tx  No falls or fx Intol of alendronate (also had to stop for dental procedure)  She is afraid to try anything else Good exercise and continues D supplementation       Relevant Orders   VITAMIN D 25 Hydroxy (Vit-D Deficiency, Fractures) (Completed)     Other   Routine general medical examination at a health care facility   Encounter for Medicare annual wellness exam - Primary    Reviewed health habits including diet and exercise and skin cancer prevention Reviewed appropriate screening tests for age  Also reviewed health mt list, fam hx and immunization status , as well as social and family history   See HPI Labs reviewed  Mammogram was done yesterday at [facility]  Disc shingrix vaccine  Declines dexa (no falls or fx)  Advance directive is utd No cognitive concerns Hearing screen is normal  Eye exam planned tomorrow       Hypertensive retinopathy of both eyes, grade 1    Has eye exam planned for tomorrow No vision changes per pt      Colon cancer screening    Colonoscopy 2017 Had ifob kit in 2018 from ins [facility]       Muscle cramps    Much improved since thyroid problem has resolved         

## 2019-01-16 NOTE — Assessment & Plan Note (Signed)
dexa 11/14  Declines another because she would decline any tx  No falls or fx Intol of alendronate (also had to stop for dental procedure)  She is afraid to try anything else Good exercise and continues D supplementation

## 2019-01-17 DIAGNOSIS — H524 Presbyopia: Secondary | ICD-10-CM | POA: Diagnosis not present

## 2019-01-17 DIAGNOSIS — H59812 Chorioretinal scars after surgery for detachment, left eye: Secondary | ICD-10-CM | POA: Diagnosis not present

## 2019-01-17 DIAGNOSIS — H25013 Cortical age-related cataract, bilateral: Secondary | ICD-10-CM | POA: Diagnosis not present

## 2019-01-17 DIAGNOSIS — H2513 Age-related nuclear cataract, bilateral: Secondary | ICD-10-CM | POA: Diagnosis not present

## 2019-01-17 DIAGNOSIS — H04123 Dry eye syndrome of bilateral lacrimal glands: Secondary | ICD-10-CM | POA: Diagnosis not present

## 2019-01-23 ENCOUNTER — Telehealth: Payer: Self-pay | Admitting: Family Medicine

## 2019-01-23 NOTE — Telephone Encounter (Signed)
I don't think she needs to be tested since she has not seen the potential exposure in over 2 weeks. Thanks for letting me know

## 2019-01-23 NOTE — Telephone Encounter (Signed)
Patient called.  A lady at [employer] sent her a text that her son tested positive for covid.  Patient hasn't seen the lady for over 3 weeks.  Patient's not having any symptoms, but patient wanted to know if Dr.Tower thought patient should be tested for covid.

## 2019-01-24 NOTE — Telephone Encounter (Signed)
Left VM letting pt know Dr. Tower's comments  

## 2019-03-12 DIAGNOSIS — E038 Other specified hypothyroidism: Secondary | ICD-10-CM | POA: Diagnosis not present

## 2019-03-19 ENCOUNTER — Telehealth: Payer: Self-pay | Admitting: Family Medicine

## 2019-03-19 DIAGNOSIS — E038 Other specified hypothyroidism: Secondary | ICD-10-CM | POA: Diagnosis not present

## 2019-03-19 NOTE — Telephone Encounter (Signed)
That sounds great- I appreciate the update

## 2019-03-19 NOTE — Telephone Encounter (Signed)
Patient called. She had a virtual visit with Dr Rayburn Go today . He has Completely released her because labs were great again.  He stated he does not need to see her anymore just to continue following up with her primary   Patient wanted to call to make sure you have an update on how her visit went

## 2019-04-21 ENCOUNTER — Other Ambulatory Visit: Payer: Self-pay

## 2019-04-21 ENCOUNTER — Ambulatory Visit: Payer: Medicare Other

## 2019-04-21 ENCOUNTER — Encounter: Payer: Self-pay | Admitting: Family Medicine

## 2019-04-21 ENCOUNTER — Ambulatory Visit (INDEPENDENT_AMBULATORY_CARE_PROVIDER_SITE_OTHER): Payer: Medicare Other | Admitting: Family Medicine

## 2019-04-21 VITALS — BP 132/80 | HR 72 | Temp 97.1°F | Ht 64.0 in | Wt 158.5 lb

## 2019-04-21 DIAGNOSIS — I10 Essential (primary) hypertension: Secondary | ICD-10-CM | POA: Diagnosis not present

## 2019-04-21 DIAGNOSIS — D320 Benign neoplasm of cerebral meninges: Secondary | ICD-10-CM

## 2019-04-21 DIAGNOSIS — K648 Other hemorrhoids: Secondary | ICD-10-CM

## 2019-04-21 DIAGNOSIS — K625 Hemorrhage of anus and rectum: Secondary | ICD-10-CM

## 2019-04-21 LAB — HEMOCCULT GUIAC POC 1CARD (OFFICE): Fecal Occult Blood, POC: POSITIVE — AB

## 2019-04-21 MED ORDER — HYDROCORTISONE (PERIANAL) 2.5 % EX CREA: 1.0000 "application " | TOPICAL_CREAM | Freq: Two times a day (BID) | CUTANEOUS | 0 refills | Status: DC

## 2019-04-21 NOTE — Assessment & Plan Note (Signed)
bp in fair control at this time  BP Readings from Last 1 Encounters:  04/21/19 132/80   No changes needed Most recent labs reviewed  Disc lifstyle change with low sodium diet and exercise

## 2019-04-21 NOTE — Assessment & Plan Note (Signed)
Pt is bothered by bleeding -more often lately (w/o pain)  Mod size int hemorrhoid seen at 10:00 position on anoscopy today with scant heme pos stool  Inst to avoid straining  Last colonoscopy 2017  Has anusol hc cream with applicator that is helpful Pt req ref to disc tx options  Ref done to GI for this  inst pt to alert [place] if symptoms suddenly worsen or change

## 2019-04-21 NOTE — Assessment & Plan Note (Signed)
Clinically stable. 

## 2019-04-21 NOTE — Patient Instructions (Addendum)
I placed a referral to GI to evaluate your hemorrhoids  Our office will call you to set that up    Keep stools soft Avoid severe straining    Use the cream when needed

## 2019-04-21 NOTE — Progress Notes (Signed)
Subjective:    Patient ID: Susan Woods, female    DOB: Nov 17, 1945, 74 y.o.   MRN: [id number]  This visit occurred during the SARS-CoV-2 public health emergency.  Safety protocols were in place, including screening questions prior to the visit, additional usage of staff PPE, and extensive cleaning of exam room while observing appropriate contact time as indicated for disinfecting solutions.    HPI Pt presents for problem of hemorrhoids   Wt Readings from Last 3 Encounters:  04/21/19 158 lb 8 oz (71.9 kg)  01/16/19 156 lb 2 oz (70.8 kg)  05/28/18 155 lb 6 oz (70.5 kg)   27.21 kg/m    She had her first covid vaccine in [place]  No problems   She is concerned about bleeding  Usually a little brb when wiping /on tissue  A month ago she had more going into the toilet Not just when stool is hard Now it still happens when not straining    She has had them for a while/ internal and external  Bleeding in the past  Have treated with anusol HC cream with applicator  It does help but comes back   No pain  No h/o rectal fissure   No bleeding today   No recent straining  Works to avoid constipation   Last colonoscopy was 2/17 with small internal hemorrhoids noted  At [facility]   Patient Active Problem List   Diagnosis Date Noted  . Muscle cramps 04/15/2018  . Internal hemorrhoids 04/27/2016  . Hypothyroid 01/13/2016  . 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 Colon cancer screening 12/22/2014  . Joint pain 08/26/2014  . Low back pain 05/18/2014  . Hypertensive retinopathy of both eyes, grade 1 12/16/2013  . Encounter for Medicare annual wellness exam 12/31/2012  . Routine general medical examination at a health care facility 02/20/2011  . MENINGIOMA 11/17/2009  . Osteoporosis 03/19/2007  . HYPOGLYCEMIA 06/12/2006  . Essential hypertension 06/12/2006  . Allergic rhinitis 06/12/2006  . GERD 06/12/2006   Past Medical History:  Diagnosis Date  . Anemia   .  GERD (gastroesophageal reflux disease)   . HTN (hypertension)    echo (8/11): EF 55%, mild MR, mild TR  . Meningioma ([place])    small; advised no f/u unless symptomatic by Dr. Loletta Specter 8/11  . Nasal pruritis    full body w/o rash   . Nonallergic rhinitis   . Osteoporosis   . Psoriasis    mild intermittent   . Tingling    in hands of ? etiol   Past Surgical History:  Procedure Laterality Date  . 2D echo  8/11   nml   . ABDOMINAL HYSTERECTOMY    . ABDOMINAL SURGERY     adhesions  . CESAREAN SECTION    . COLONOSCOPY  2006  . dexa - osteopenia  2005   dexa 2009 - osteoporosis/fairly stable   . hosp TIA  8/11  . NASAL SINUS SURGERY  2002  . OOPHORECTOMY    . ORIF FINGER / THUMB FRACTURE  12/06  . small meningioma    . urethral stricutre     dilated  . VESICOVAGINAL FISTULA CLOSURE W/ TAH     Social History   Tobacco Use  . Smoking status: Former Smoker    Quit date: 04/08/1987    Years since quitting: 32.0  . Smokeless tobacco: Never Used  Substance Use Topics  . Alcohol use: Yes    Alcohol/week: 0.0 standard drinks    Comment:  rarely  . Drug use: No   Family History  Problem Relation Age of Onset  . Cancer Father        throat CA  . Diabetes Father   . Heart disease Mother        MI late 53s - CHF   . Hypertension Mother   . Stroke Brother   . Diabetes Brother   . Heart disease Sister        CHF  . Diabetes Sister   . Hypertension Brother   . Diabetes Brother    Allergies  Allergen Reactions  . Amlodipine Swelling    Ankle swelling  . Indomethacin     REACTION: swelling, rash  . Levaquin [Levofloxacin In D5w] Other (See Comments)    Muscle pain   . Penicillins     REACTION: urticaria (hives)  . Sulfonamide Derivatives     REACTION: tingling around mouth and hands felt tight  . Zithromax [Azithromycin]   . Povidone-Iodine Hives and Itching   Current Outpatient Medications on File Prior to Visit  Medication Sig Dispense Refill  . amLODipine  (NORVASC) 5 MG tablet Take 1 tablet (5 mg total) by mouth daily. 90 tablet 3  . Calcium Carb-Cholecalciferol (CALCIUM 600 + D PO) Take 1 tablet by mouth daily.     . Cholecalciferol (VITAMIN D3) 1000 UNITS CAPS Take 1 capsule by mouth daily.      . cyclobenzaprine (FLEXERIL) 10 MG tablet TAKE 1/2-1 TABLET BY MOUTH 3 TIMES DAILY AS NEEDED FOR MUSCLE SPASMS (WATCH FOR SEDATION) 30 tablet 3  . Ferrous Sulfate (FE-CAPS) 250 MG CPCR Take 1 capsule by mouth daily.      . fish oil-omega-3 fatty acids 1000 MG capsule Take 2 g by mouth 2 (two) times daily. Alternate with flax oil    . fluticasone (FLONASE) 50 MCG/ACT nasal spray Place 2 sprays into both nostrils daily. 48 g 3  . hydrochlorothiazide (HYDRODIURIL) 25 MG tablet Take 1 tablet (25 mg total) by mouth daily. 90 tablet 3  . levocetirizine (XYZAL) 5 MG tablet TAKE 1 TABLET BY MOUTH  DAILY AS NEEDED 90 tablet 3  . losartan (COZAAR) 100 MG tablet Take 1 tablet (100 mg total) by mouth daily. 90 tablet 3  . metoprolol succinate (TOPROL-XL) 100 MG 24 hr tablet TAKE 1 TABLET BY MOUTH ONCE DAILY WITH OR IMMEDIATELY  FOLLOWING A MEAL 90 tablet 3  . Multiple Vitamin (MULTIVITAMIN) tablet Take 1 tablet by mouth daily.      . NON FORMULARY similisan dry eye drops and ear drops. UAD PRN      No current facility-administered medications on file prior to visit.    Review of Systems  Constitutional: Negative for activity change, appetite change, fatigue, fever and unexpected weight change.  HENT: Negative for congestion, ear pain, rhinorrhea, sinus pressure and sore throat.   Eyes: Negative for pain, redness and visual disturbance.  Respiratory: Negative for cough, shortness of breath and wheezing.   Cardiovascular: Negative for chest pain and palpitations.  Gastrointestinal: Positive for anal bleeding. Negative for abdominal pain, blood in stool, constipation, diarrhea, nausea and rectal pain.  Endocrine: Negative for polydipsia and polyuria.    Genitourinary: Negative for dysuria, frequency and urgency.  Musculoskeletal: Negative for arthralgias, back pain and myalgias.  Skin: Negative for pallor and rash.  Allergic/Immunologic: Negative for environmental allergies.  Neurological: Negative for dizziness, syncope and headaches.  Hematological: Negative for adenopathy. Does not bruise/bleed easily.  Psychiatric/Behavioral: Negative for decreased concentration and  dysphoric mood. The patient is not nervous/anxious.        Objective:   Physical Exam Constitutional:      General: She is not in acute distress.    Appearance: Normal appearance. She is normal weight. She is not ill-appearing.  HENT:     Head: Normocephalic and atraumatic.  Eyes:     General: No scleral icterus.    Conjunctiva/sclera: Conjunctivae normal.     Pupils: Pupils are equal, round, and reactive to light.  Neck:     Vascular: No carotid bruit.  Cardiovascular:     Rate and Rhythm: Normal rate and regular rhythm.     Heart sounds: Normal heart sounds.  Pulmonary:     Effort: Pulmonary effort is normal. No respiratory distress.     Breath sounds: Normal breath sounds. No wheezing or rales.  Abdominal:     General: Abdomen is flat. Bowel sounds are normal. There is no distension.     Palpations: Abdomen is soft. There is no mass.  Genitourinary:    Rectum: Guaiac result positive. Internal hemorrhoid present. No mass, tenderness or anal fissure. Normal anal tone.     Comments: Moderate size internal hemorrhoid seen at 10:00 position on anoscopy Not actively bleeding but stool is scant heme positive  No rectal tenderness or mass noted Nl tone  Musculoskeletal:     Cervical back: Normal range of motion and neck supple.  Lymphadenopathy:     Cervical: No cervical adenopathy.  Skin:    General: Skin is warm and dry.     Coloration: Skin is not pale.     Findings: No erythema or rash.  Neurological:     Mental Status: She is alert.            Assessment & Plan:   Problem List Items Addressed This Visit      Cardiovascular and Mediastinum   Essential hypertension    bp in fair control at this time  BP Readings from Last 1 Encounters:  04/21/19 132/80   No changes needed Most recent labs reviewed  Disc lifstyle change with low sodium diet and exercise        Internal hemorrhoids - Primary    Pt is bothered by bleeding -more often lately (w/o pain)  Mod size int hemorrhoid seen at 10:00 position on anoscopy today with scant heme pos stool  Inst to avoid straining  Last colonoscopy 2017  Has anusol hc cream with applicator that is helpful Pt req ref to disc tx options  Ref done to GI for this  inst pt to alert [place] if symptoms suddenly worsen or change         Relevant Orders   Ambulatory referral to Gastroenterology     Nervous and Auditory   MENINGIOMA    Clinically stable

## 2019-05-22 ENCOUNTER — Other Ambulatory Visit: Payer: Self-pay | Admitting: Family Medicine

## 2019-05-22 DIAGNOSIS — K648 Other hemorrhoids: Secondary | ICD-10-CM | POA: Diagnosis not present

## 2019-06-16 ENCOUNTER — Encounter: Payer: Self-pay | Admitting: Family Medicine

## 2019-06-16 ENCOUNTER — Ambulatory Visit (INDEPENDENT_AMBULATORY_CARE_PROVIDER_SITE_OTHER)
Admission: RE | Admit: 2019-06-16 | Discharge: 2019-06-16 | Disposition: A | Discharge status: Home / Self Care | Payer: Medicare Other | Source: Ambulatory Visit | Attending: Family Medicine | Admitting: Family Medicine

## 2019-06-16 ENCOUNTER — Ambulatory Visit (INDEPENDENT_AMBULATORY_CARE_PROVIDER_SITE_OTHER): Payer: Medicare Other | Admitting: Family Medicine

## 2019-06-16 ENCOUNTER — Other Ambulatory Visit: Payer: Self-pay

## 2019-06-16 VITALS — BP 136/84 | HR 86 | Temp 97.7°F | Ht 64.0 in | Wt 154.0 lb

## 2019-06-16 DIAGNOSIS — M5442 Lumbago with sciatica, left side: Secondary | ICD-10-CM | POA: Diagnosis not present

## 2019-06-16 DIAGNOSIS — R1031 Right lower quadrant pain: Secondary | ICD-10-CM

## 2019-06-16 DIAGNOSIS — R1032 Left lower quadrant pain: Secondary | ICD-10-CM

## 2019-06-16 DIAGNOSIS — K648 Other hemorrhoids: Secondary | ICD-10-CM | POA: Diagnosis not present

## 2019-06-16 DIAGNOSIS — M160 Bilateral primary osteoarthritis of hip: Secondary | ICD-10-CM | POA: Diagnosis not present

## 2019-06-16 NOTE — Assessment & Plan Note (Signed)
More bleeding lately-pt has appt to discuss with a surgeon mid month

## 2019-06-16 NOTE — Assessment & Plan Note (Signed)
Now intermittently radiating to L leg  Some pain in groin also (hip xr ordered)  Good rom today (improved)  Reassuring exam  Ibuprofen helping prn otc  Has cyclobenzaprine for night time  Given handout re: rehab exercises for low back  Enc use of heat  Would consider formal PT in the future  inst to update if no further improvement

## 2019-06-16 NOTE — Progress Notes (Signed)
Subjective:    Patient ID: Susan Woods, female    DOB: 10-13-1945, 74 y.o.   MRN: [id number]  This visit occurred during the SARS-CoV-2 public health emergency.  Safety protocols were in place, including screening questions prior to the visit, additional usage of staff PPE, and extensive cleaning of exam room while observing appropriate contact time as indicated for disinfecting solutions.    HPI Pt presents for back and hip pain as well as hemorrhoids   Wt Readings from Last 3 Encounters:  06/16/19 154 lb (69.9 kg)  04/21/19 158 lb 8 oz (71.9 kg)  01/16/19 156 lb 2 oz (70.8 kg)   26.43 kg/m  Has had pain for about a week  Has had it before but it usually gets better quicker  Pain is in low back and radiates forward to the low abdomen  Also radiates to the legs   L side worse- over the weekend the L leg ached   Best when walking  Worse when getting up from sitting /getting up in the am   Feels generally weak-nothing focal No numbness    She has taken a muscle relaxer  Also advil   No rectal pain  Has had more rectal bleeding however  She has appt with surgeon on 4/13 for her hemorrhoids  Anxious to take care of hemorrhoids   Last ls xray 2016  CLINICAL DATA:  74 year old female with lumbar back pain greater on the right. Pain radiating to the sides. Initial encounter.  EXAM: LUMBAR SPINE - COMPLETE 4+ VIEW  COMPARISON:  None.  FINDINGS: Normal lumbar segmentation. Vertebral height and alignment within normal limits. Relatively preserved disc spaces. Mild endplate spurring in the upper lumbar spine. No pars fracture. sacral ala and SI joints within normal limits. Grossly intact visible lower thoracic levels.  IMPRESSION: No acute osseous abnormality identified in the lumbar spine.   Electronically Signed   By: Genevie Ann M.D.   On: 05/18/2014 17:20  No urinary symptoms   Patient Active Problem List   Diagnosis Date Noted  .  Bilateral groin pain 06/16/2019  . Rectal bleeding 04/21/2019  . Muscle cramps 04/15/2018  . Internal hemorrhoids 04/27/2016  . Hypothyroid 01/13/2016  . 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 Colon cancer screening 12/22/2014  . Joint pain 08/26/2014  . Low back pain 05/18/2014  . Hypertensive retinopathy of both eyes, grade 1 12/16/2013  . Encounter for Medicare annual wellness exam 12/31/2012  . Routine general medical examination at a health care facility 02/20/2011  . MENINGIOMA 11/17/2009  . Osteoporosis 03/19/2007  . HYPOGLYCEMIA 06/12/2006  . Essential hypertension 06/12/2006  . Allergic rhinitis 06/12/2006  . GERD 06/12/2006   Past Medical History:  Diagnosis Date  . Anemia   . GERD (gastroesophageal reflux disease)   . HTN (hypertension)    echo (8/11): EF 55%, mild MR, mild TR  . Meningioma ([place])    small; advised no f/u unless symptomatic by Dr. Loletta Specter 8/11  . Nasal pruritis    full body w/o rash   . Nonallergic rhinitis   . Osteoporosis   . Psoriasis    mild intermittent   . Tingling    in hands of ? etiol   Past Surgical History:  Procedure Laterality Date  . 2D echo  8/11   nml   . ABDOMINAL HYSTERECTOMY    . ABDOMINAL SURGERY     adhesions  . CESAREAN SECTION    . COLONOSCOPY  2006  . dexa - osteopenia  2005   dexa 2009 - osteoporosis/fairly stable   . hosp TIA  8/11  . NASAL SINUS SURGERY  2002  . OOPHORECTOMY    . ORIF FINGER / THUMB FRACTURE  12/06  . small meningioma    . urethral stricutre     dilated  . VESICOVAGINAL FISTULA CLOSURE W/ TAH     Social History   Tobacco Use  . Smoking status: Former Smoker    Quit date: 04/08/1987    Years since quitting: 32.2  . Smokeless tobacco: Never Used  Substance Use Topics  . Alcohol use: Yes    Alcohol/week: 0.0 standard drinks    Comment: rarely  . Drug use: No   Family History  Problem Relation Age of Onset  . Cancer Father        throat CA  . Diabetes Father   . Heart disease Mother         MI late 56s - CHF   . Hypertension Mother   . Stroke Brother   . Diabetes Brother   . Heart disease Sister        CHF  . Diabetes Sister   . Hypertension Brother   . Diabetes Brother    Allergies  Allergen Reactions  . Amlodipine Swelling    Ankle swelling  . Indomethacin     REACTION: swelling, rash  . Levaquin [Levofloxacin In D5w] Other (See Comments)    Muscle pain   . Penicillins     REACTION: urticaria (hives)  . Sulfonamide Derivatives     REACTION: tingling around mouth and hands felt tight  . Zithromax [Azithromycin]   . Povidone-Iodine Hives and Itching   Current Outpatient Medications on File Prior to Visit  Medication Sig Dispense Refill  . amLODipine (NORVASC) 5 MG tablet Take 1 tablet (5 mg total) by mouth daily. 90 tablet 3  . Calcium Carb-Cholecalciferol (CALCIUM 600 + D PO) Take 1 tablet by mouth daily.     . Cholecalciferol (VITAMIN D3) 1000 UNITS CAPS Take 1 capsule by mouth daily.      . cyclobenzaprine (FLEXERIL) 10 MG tablet TAKE 1/2-1 TABLET BY MOUTH 3 TIMES DAILY AS NEEDED FOR MUSCLE SPASMS (WATCH FOR SEDATION) 30 tablet 3  . Ferrous Sulfate (FE-CAPS) 250 MG CPCR Take 1 capsule by mouth daily.      . fish oil-omega-3 fatty acids 1000 MG capsule Take 2 g by mouth 2 (two) times daily. Alternate with flax oil    . fluticasone (FLONASE) 50 MCG/ACT nasal spray Place 2 sprays into both nostrils daily. 48 g 3  . hydrochlorothiazide (HYDRODIURIL) 25 MG tablet Take 1 tablet (25 mg total) by mouth daily. 90 tablet 3  . hydrocortisone (PROCTOSOL HC) 2.5 % rectal cream Place 1 application rectally 2 (two) times daily. 30 g 0  . levocetirizine (XYZAL) 5 MG tablet TAKE 1 TABLET BY MOUTH  DAILY AS NEEDED 90 tablet 2  . losartan (COZAAR) 100 MG tablet Take 1 tablet (100 mg total) by mouth daily. 90 tablet 3  . metoprolol succinate (TOPROL-XL) 100 MG 24 hr tablet TAKE 1 TABLET BY MOUTH ONCE DAILY WITH OR IMMEDIATELY  FOLLOWING A MEAL 90 tablet 3  . Multiple Vitamin  (MULTIVITAMIN) tablet Take 1 tablet by mouth daily.      . NON FORMULARY similisan dry eye drops and ear drops. UAD PRN      No current facility-administered medications on file prior to visit.    Review of Systems  Constitutional: Negative for  activity change, appetite change, fatigue, fever and unexpected weight change.  HENT: Negative for congestion, ear pain, rhinorrhea, sinus pressure and sore throat.   Eyes: Negative for pain, redness and visual disturbance.  Respiratory: Negative for cough, shortness of breath and wheezing.   Cardiovascular: Negative for chest pain and palpitations.  Gastrointestinal: Negative for abdominal pain, blood in stool, constipation and diarrhea.  Endocrine: Negative for polydipsia and polyuria.  Genitourinary: Negative for dysuria, frequency and urgency.  Musculoskeletal: Positive for back pain and gait problem. Negative for arthralgias, joint swelling and myalgias.  Skin: Negative for pallor and rash.  Allergic/Immunologic: Negative for environmental allergies.  Neurological: Negative for dizziness, syncope, numbness and headaches.  Hematological: Negative for adenopathy. Does not bruise/bleed easily.  Psychiatric/Behavioral: Negative for decreased concentration and dysphoric mood. The patient is not nervous/anxious.        Objective:   Physical Exam Constitutional:      General: She is not in acute distress.    Appearance: Normal appearance. She is well-developed and normal weight. She is not ill-appearing.  HENT:     Head: Normocephalic and atraumatic.  Eyes:     General: No scleral icterus.    Conjunctiva/sclera: Conjunctivae normal.     Pupils: Pupils are equal, round, and reactive to light.  Cardiovascular:     Rate and Rhythm: Normal rate and regular rhythm.  Pulmonary:     Effort: Pulmonary effort is normal.     Breath sounds: Normal breath sounds. No wheezing or rales.  Abdominal:     General: Bowel sounds are normal. There is no  distension.     Palpations: Abdomen is soft.     Tenderness: There is no abdominal tenderness.  Musculoskeletal:        General: Tenderness present.     Cervical back: Normal range of motion and neck supple.     Lumbar back: Spasms and tenderness present. No edema, deformity or bony tenderness. Decreased range of motion. Negative right straight leg raise test and negative left straight leg raise test. No scoliosis.     Comments: Tight hamstring with SLR  Some tenderness of lumbar musculature -bilateral  No neuro changes  Flex 90 deg/ext full  Some pain on L lateral flexion   Nl rom of hips  Some pain with full internal rotation of R hip  No crepitus  Nl gait    Lymphadenopathy:     Cervical: No cervical adenopathy.  Skin:    General: Skin is warm and dry.     Coloration: Skin is not pale.     Findings: No erythema or rash.  Neurological:     Mental Status: She is alert.     Cranial Nerves: No cranial nerve deficit.     Sensory: No sensory deficit.     Motor: No weakness, atrophy or abnormal muscle tone.     Coordination: Coordination normal.     Deep Tendon Reflexes: Reflexes are normal and symmetric. Reflexes normal.     Comments: Negative SLR  Psychiatric:        Mood and Affect: Mood normal.           Assessment & Plan:   Problem List Items Addressed This Visit      Cardiovascular and Mediastinum   Internal hemorrhoids    More bleeding lately-pt has appt to discuss with a surgeon mid month         Other   Low back pain - Primary    Now intermittently radiating  to L leg  Some pain in groin also (hip xr ordered)  Good rom today (improved)  Reassuring exam  Ibuprofen helping prn otc  Has cyclobenzaprine for night time  Given handout re: rehab exercises for low back  Enc use of heat  Would consider formal PT in the future  inst to update if no further improvement       Bilateral groin pain    Bilateral - some radiation to or from low back  Some  discomfort with internal rotation on the R  Xray ordered to look for OA or other finding  Ibuprofen helps  Discussed stretching  Plan to follow rad review      Relevant Orders   DG HIPS BILAT WITH PELVIS 3-4 VIEWS

## 2019-06-16 NOTE — Patient Instructions (Addendum)
The cyclobenzaprine is fine for night time or when not working or driving  Ibuprofen- take with food as needed   Use heat when you can for 10 minutes at a time Try the rehab exercises   Most likely the back pain is muscular and causing pain in the leg   I do want to check hips in light of groin pain  Xray now  We should have results tomorrow

## 2019-06-16 NOTE — Assessment & Plan Note (Addendum)
Bilateral - some radiation to or from low back  Some discomfort with internal rotation on the R  Xray ordered to look for OA or other finding  Ibuprofen helps  Discussed stretching  Plan to follow rad review

## 2019-06-18 ENCOUNTER — Telehealth: Payer: Self-pay | Admitting: *Deleted

## 2019-06-18 NOTE — Telephone Encounter (Signed)
Left VM requesting pt to call the office back regarding xray results 

## 2019-06-19 NOTE — Telephone Encounter (Signed)
Addressed through result notes  

## 2019-06-24 ENCOUNTER — Ambulatory Visit: Payer: Self-pay | Admitting: General Surgery

## 2019-06-24 DIAGNOSIS — K642 Third degree hemorrhoids: Secondary | ICD-10-CM | POA: Diagnosis not present

## 2019-06-24 NOTE — H&P (Signed)
The patient is a 74 year old female who presents with a complaint of Rectal bleeding. 74 year old female who presents to the office for evaluation of rectal bleeding and anal pain. This has been going on for several months. She has been increasing her fiber over that time. This has helped her have softer more regular bowel movements, but she continues to have some spotting and drainage. Her last colonoscopy was in 2017, normal. She has never had any hemorrhoid procedures before. She has tried a prescription hemorrhoid cream, which resolved her symptoms but did not get rid of the problem.   Past Surgical History Mammie Lorenzo, LPN; 624THL 624THL AM) Appendectomy Cesarean Section - 1 Hysterectomy (not due to cancer) - Complete Hysterectomy (not due to cancer) - Partial Oral Surgery  Diagnostic Studies History Mammie Lorenzo, LPN; 624THL 624THL AM) Colonoscopy 1-5 years ago Mammogram within last year Pap Smear >5 years ago  Allergies Mammie Lorenzo, LPN; 624THL [id number] AM) Penicillins bruising, welts Sulfa Drugs numbness  Medication History Mammie Lorenzo, LPN; 624THL 075-GRM AM) amLODIPine Besylate (5MG  Tablet, Oral) Active. Losartan Potassium (100MG  Tablet, Oral) Active. Metoprolol Succinate ER (100MG  Tablet ER 24HR, Oral) Active. hydroCHLOROthiazide (25MG  Tablet, Oral) Active. Omega-3 (1000MG  Capsule, Oral) Active. Multivitamin (Oral) Active. Calcium-Vitamin D (600MG  Tablet Chewable, Oral) Active. Medications Reconciled  Social History Mammie Lorenzo, LPN; 624THL 624THL AM) Alcohol use Moderate alcohol use. Caffeine use Carbonated beverages. No drug use Tobacco use Former smoker.  Family History Mammie Lorenzo, LPN; 624THL 624THL AM) Bleeding disorder Sister. Cancer Father. Diabetes Mellitus Brother, Father, Sister. Heart Disease Mother. Hypertension Brother, Mother, Sister. Malignant Neoplasm Of Pancreas Family Members In  General.  Pregnancy / Birth History Mammie Lorenzo, LPN; 624THL 624THL AM) Age at menarche 7 years. Age of menopause <47 Contraceptive History Intrauterine device, Oral contraceptives. Gravida 2 Maternal age 43-25 Para 1  Other Problems Mammie Lorenzo, LPN; 624THL 624THL AM) Back Pain Chest pain Gastroesophageal Reflux Disease Hemorrhoids High blood pressure Migraine Headache Thyroid Disease     Review of Systems Claiborne Billings Dockery LPN; 624THL 624THL AM) General Not Present- Appetite Loss, Chills, Fatigue, Fever, Night Sweats, Weight Gain and Weight Loss. Skin Not Present- Change in Wart/Mole, Dryness, Hives, Jaundice, New Lesions, Non-Healing Wounds, Rash and Ulcer. HEENT Present- Hoarseness, Seasonal Allergies, Sinus Pain and Visual Disturbances. Not Present- Earache, Hearing Loss, Nose Bleed, Oral Ulcers, Ringing in the Ears, Sore Throat, Wears glasses/contact lenses and Yellow Eyes. Respiratory Not Present- Bloody sputum, Chronic Cough, Difficulty Breathing, Snoring and Wheezing. Breast Not Present- Breast Mass, Breast Pain, Nipple Discharge and Skin Changes. Cardiovascular Present- Leg Cramps. Not Present- Chest Pain, Difficulty Breathing Lying Down, Palpitations, Rapid Heart Rate, Shortness of Breath and Swelling of Extremities. Gastrointestinal Present- Abdominal Pain, Bloating, Bloody Stool, Gets full quickly at meals and Hemorrhoids. Not Present- Change in Bowel Habits, Chronic diarrhea, Constipation, Difficulty Swallowing, Excessive gas, Indigestion, Nausea, Rectal Pain and Vomiting. Female Genitourinary Not Present- Frequency, Nocturia, Painful Urination, Pelvic Pain and Urgency. Musculoskeletal Present- Back Pain, Joint Pain, Joint Stiffness and Muscle Pain. Not Present- Muscle Weakness and Swelling of Extremities. Neurological Present- Headaches. Not Present- Decreased Memory, Fainting, Numbness, Seizures, Tingling, Tremor, Trouble walking and  Weakness. Psychiatric Not Present- Anxiety, Bipolar, Change in Sleep Pattern, Depression, Fearful and Frequent crying. Endocrine Not Present- Cold Intolerance, Excessive Hunger, Hair Changes, Heat Intolerance, Hot flashes and New Diabetes. Hematology Not Present- Blood Thinners, Easy Bruising, Excessive bleeding, Gland problems, HIV and Persistent Infections.  Vitals Claiborne Billings Dockery LPN; 624THL [id number] AM) 06/24/2019 11:25 AM Weight: 157.2  lb Height: 65in Body Surface Area: 1.79 m Body Mass Index: 26.16 kg/m  Temp.: 97.53F(Thermal Scan)  Pulse: 88 (Regular)  BP: 122/78 (Sitting, Left Arm, Standard)        Physical Exam Leighton Ruff MD; [id number] 11:38 AM)  General Mental Status-Alert. General Appearance-Not in acute distress. Build & Nutrition-Well nourished. Posture-Normal posture. Gait-Normal.  Head and Neck Head-normocephalic, atraumatic with no lesions or palpable masses. Trachea-midline.  Chest and Lung Exam Chest and lung exam reveals -normal excursion with symmetric chest walls.  Abdomen Inspection Inspection of the abdomen reveals - No Hernias. Palpation/Percussion Palpation and Percussion of the abdomen reveal - Soft, Non Tender and No Rigidity (guarding).  Neurologic Neurologic evaluation reveals -alert and oriented x 3 with no impairment of recent or remote memory, normal attention span and ability to concentrate, normal sensation and normal coordination.  Musculoskeletal Normal Exam - Bilateral-Upper Extremity Strength Normal and Lower Extremity Strength Normal.   Results Leighton Ruff MD; [id number] 11:37 AM) Procedures  Name Value Date ANOSCOPY, DIAGNOSTIC [id number]) [ Hemorrhoids ] Procedure Other: Procedure: Anoscopy....Marland KitchenMarland KitchenSurgeon: Marcello Moores....Marland KitchenMarland KitchenAfter the risks and benefits were explained, verbal consent was obtained for above procedure. A medical assistant chaperone was present thoroughout the entire  procedure. ....Marland KitchenMarland KitchenAnesthesia: none....Marland KitchenMarland KitchenDiagnosis: rectal bleeding....Marland KitchenMarland KitchenFindings: Grade 2 right posterior hemorrhoid, grade 3 right anterior hemorrhoid, grade 1. Left lateral hemorrhoid  Performed: 06/24/2019 11:37 AM    Assessment & Plan Leighton Ruff MD; [id number] 11:40 AM)  PROLAPSED INTERNAL HEMORRHOIDS, GRADE 3 [id number]) Impression: 74 year old female who presents to the office with rectal bleeding and rectal pain. On exam today she has a grade 3 right anterior hemorrhoid and a grade 2 right posterior hemorrhoid. We discussed rubber band ligation as an option to control her bleeding. We discussed the success rate is between 60 and 70%. We also discussed hemorrhoidectomy and trans hemorrhoidal dearterialization. We discussed the typical postoperative pain associated with both surgeries. We discussed the typical recurrence rate for prolapse and rectal bleeding. She has elected to proceed with [place]. We will schedule this at her convenience

## 2019-07-14 ENCOUNTER — Telehealth: Payer: Self-pay | Admitting: Family Medicine

## 2019-07-14 NOTE — Telephone Encounter (Signed)
Patient called to make you aware They she has hemorrhoid surgery scheduled for June 11th.

## 2019-07-14 NOTE — Telephone Encounter (Signed)
That's good, thanks for letting me know

## 2019-07-16 ENCOUNTER — Telehealth: Payer: Self-pay

## 2019-07-16 NOTE — Telephone Encounter (Signed)
Pt wonders what her blood type is? Pt has never given blood before but pt thought years ago she remembered her blood type as AB. I could not find blood type listed under lab tab. Pt is going to ck with  Ins co to see if checking her blood type would be covered and pt will cb to schedule lab test if needed. Pt said she feels great; a friend of pts was talking about her blood type and got pt to wondering about hers.nothing further needed at this time.

## 2019-08-14 ENCOUNTER — Encounter (HOSPITAL_BASED_OUTPATIENT_CLINIC_OR_DEPARTMENT_OTHER): Payer: Self-pay | Admitting: General Surgery

## 2019-08-14 ENCOUNTER — Other Ambulatory Visit: Payer: Self-pay

## 2019-08-14 NOTE — Progress Notes (Signed)
Spoke w/ via phone for pre-op interview---patient Lab needs dos---- I stat 8 and ekg              COVID test ------08-20-2019 @900  am Arrive at -------530 am 08-22-2019 NPO after ------midnight Medications to take morning of surgery ----metoprolol succinate, amlodipine, eye drop- Diabetic medication -----n/a Patient Special Instructions -----none Pre-Op special Istructions -----none Patient verbalized understanding of instructions that were given at this phone interview. Patient denies shortness of breath, chest pain, fever, cough a this phone interview.

## 2019-08-19 ENCOUNTER — Other Ambulatory Visit (HOSPITAL_COMMUNITY): Payer: Medicare Other

## 2019-08-20 ENCOUNTER — Encounter: Payer: Self-pay | Admitting: Family Medicine

## 2019-08-20 ENCOUNTER — Other Ambulatory Visit (HOSPITAL_COMMUNITY)
Admission: RE | Admit: 2019-08-20 | Discharge: 2019-08-20 | Disposition: A | Discharge status: Home / Self Care | Payer: Medicare Other | Source: Ambulatory Visit | Attending: General Surgery | Admitting: General Surgery

## 2019-08-20 DIAGNOSIS — Z20822 Contact with and (suspected) exposure to covid-19: Secondary | ICD-10-CM | POA: Insufficient documentation

## 2019-08-20 DIAGNOSIS — Z01812 Encounter for preprocedural laboratory examination: Secondary | ICD-10-CM | POA: Insufficient documentation

## 2019-08-20 LAB — SARS CORONAVIRUS 2 (TAT 6-24 HRS): SARS Coronavirus 2: NEGATIVE

## 2019-08-22 ENCOUNTER — Encounter (HOSPITAL_BASED_OUTPATIENT_CLINIC_OR_DEPARTMENT_OTHER): Payer: Self-pay | Admitting: General Surgery

## 2019-08-22 ENCOUNTER — Encounter (HOSPITAL_BASED_OUTPATIENT_CLINIC_OR_DEPARTMENT_OTHER)
Admission: RE | Disposition: A | Discharge status: Home / Self Care | Payer: Self-pay | Source: Home / Self Care | Attending: General Surgery

## 2019-08-22 ENCOUNTER — Other Ambulatory Visit: Payer: Self-pay

## 2019-08-22 ENCOUNTER — Ambulatory Visit (HOSPITAL_BASED_OUTPATIENT_CLINIC_OR_DEPARTMENT_OTHER)
Admission: RE | Admit: 2019-08-22 | Discharge: 2019-08-22 | Disposition: A | Discharge status: Home / Self Care | Payer: Medicare Other | Attending: General Surgery | Admitting: General Surgery

## 2019-08-22 ENCOUNTER — Ambulatory Visit (HOSPITAL_BASED_OUTPATIENT_CLINIC_OR_DEPARTMENT_OTHER): Payer: Medicare Other | Admitting: Anesthesiology

## 2019-08-22 DIAGNOSIS — K642 Third degree hemorrhoids: Secondary | ICD-10-CM | POA: Diagnosis not present

## 2019-08-22 DIAGNOSIS — Z79899 Other long term (current) drug therapy: Secondary | ICD-10-CM | POA: Insufficient documentation

## 2019-08-22 DIAGNOSIS — I10 Essential (primary) hypertension: Secondary | ICD-10-CM | POA: Diagnosis not present

## 2019-08-22 DIAGNOSIS — K641 Second degree hemorrhoids: Secondary | ICD-10-CM | POA: Insufficient documentation

## 2019-08-22 DIAGNOSIS — K648 Other hemorrhoids: Secondary | ICD-10-CM | POA: Diagnosis not present

## 2019-08-22 DIAGNOSIS — Z87891 Personal history of nicotine dependence: Secondary | ICD-10-CM | POA: Insufficient documentation

## 2019-08-22 DIAGNOSIS — D649 Anemia, unspecified: Secondary | ICD-10-CM | POA: Diagnosis not present

## 2019-08-22 DIAGNOSIS — K219 Gastro-esophageal reflux disease without esophagitis: Secondary | ICD-10-CM | POA: Diagnosis not present

## 2019-08-22 HISTORY — DX: Personal history of other endocrine, nutritional and metabolic disease: Z86.39

## 2019-08-22 HISTORY — DX: Myoneural disorder, unspecified: G70.9

## 2019-08-22 HISTORY — PX: TRANSANAL HEMORRHOIDAL DEARTERIALIZATION: SHX6136

## 2019-08-22 HISTORY — DX: Unspecified osteoarthritis, unspecified site: M19.90

## 2019-08-22 LAB — POCT I-STAT, CHEM 8
BUN: 17 mg/dL (ref 8–23)
Calcium, Ion: 1.27 mmol/L (ref 1.15–1.40)
Chloride: 102 mmol/L (ref 98–111)
Creatinine, Ser: 0.8 mg/dL (ref 0.44–1.00)
Glucose, Bld: 94 mg/dL (ref 70–99)
HCT: 36 % (ref 36.0–46.0)
Hemoglobin: 12.2 g/dL (ref 12.0–15.0)
Potassium: 3.7 mmol/L (ref 3.5–5.1)
Sodium: 142 mmol/L (ref 135–145)
TCO2: 29 mmol/L (ref 22–32)

## 2019-08-22 SURGERY — TRANSANAL HEMORRHOIDAL DEARTERIALIZATION
Anesthesia: Monitor Anesthesia Care | Site: Rectum

## 2019-08-22 MED ORDER — OXYCODONE HCL 5 MG PO TABS: 5.0000 mg | ORAL_TABLET | Freq: Four times a day (QID) | ORAL | 0 refills | Status: DC | PRN

## 2019-08-22 MED ORDER — FENTANYL CITRATE (PF) 100 MCG/2ML IJ SOLN
INTRAMUSCULAR | Status: AC
  Filled 2019-08-22: qty 2

## 2019-08-22 MED ORDER — BUPIVACAINE-EPINEPHRINE 0.5% -1:200000 IJ SOLN
INTRAMUSCULAR | Status: DC | PRN
  Administered 2019-08-22: 20 mL

## 2019-08-22 MED ORDER — OXYCODONE HCL 5 MG PO TABS: 5.0000 mg | ORAL_TABLET | Freq: Once | ORAL | Status: DC | PRN

## 2019-08-22 MED ORDER — PROPOFOL 10 MG/ML IV BOLUS
INTRAVENOUS | Status: DC | PRN
  Administered 2019-08-22 (×2): 30 mg via INTRAVENOUS

## 2019-08-22 MED ORDER — LIDOCAINE 2% (20 MG/ML) 5 ML SYRINGE
INTRAMUSCULAR | Status: AC
  Filled 2019-08-22: qty 5

## 2019-08-22 MED ORDER — LIDOCAINE HCL (CARDIAC) PF 100 MG/5ML IV SOSY
PREFILLED_SYRINGE | INTRAVENOUS | Status: DC | PRN
  Administered 2019-08-22: 60 mg via INTRAVENOUS

## 2019-08-22 MED ORDER — GABAPENTIN 300 MG PO CAPS
ORAL_CAPSULE | ORAL | Status: AC
  Filled 2019-08-22: qty 1

## 2019-08-22 MED ORDER — MEPERIDINE HCL 25 MG/ML IJ SOLN: 6.2500 mg | INTRAMUSCULAR | Status: DC | PRN

## 2019-08-22 MED ORDER — PROPOFOL 10 MG/ML IV BOLUS
INTRAVENOUS | Status: AC
  Filled 2019-08-22: qty 20

## 2019-08-22 MED ORDER — PROPOFOL 500 MG/50ML IV EMUL
INTRAVENOUS | Status: DC | PRN
  Administered 2019-08-22: 75 ug/kg/min via INTRAVENOUS

## 2019-08-22 MED ORDER — FENTANYL CITRATE (PF) 100 MCG/2ML IJ SOLN: 25.0000 ug | INTRAMUSCULAR | Status: DC | PRN

## 2019-08-22 MED ORDER — SODIUM CHLORIDE 0.9% FLUSH: 3.0000 mL | Freq: Two times a day (BID) | INTRAVENOUS | Status: DC

## 2019-08-22 MED ORDER — LACTATED RINGERS IV SOLN: INTRAVENOUS | Status: DC

## 2019-08-22 MED ORDER — ACETAMINOPHEN 325 MG PO TABS: 325.0000 mg | ORAL_TABLET | ORAL | Status: DC | PRN

## 2019-08-22 MED ORDER — KETOROLAC TROMETHAMINE 30 MG/ML IJ SOLN
INTRAMUSCULAR | Status: DC | PRN
  Administered 2019-08-22: 30 mg via INTRAVENOUS

## 2019-08-22 MED ORDER — ACETAMINOPHEN 500 MG PO TABS
ORAL_TABLET | ORAL | Status: AC
  Filled 2019-08-22: qty 2

## 2019-08-22 MED ORDER — ONDANSETRON HCL 4 MG/2ML IJ SOLN
INTRAMUSCULAR | Status: AC
  Filled 2019-08-22: qty 2

## 2019-08-22 MED ORDER — MIDAZOLAM HCL 5 MG/5ML IJ SOLN
INTRAMUSCULAR | Status: DC | PRN
  Administered 2019-08-22 (×2): .5 mg via INTRAVENOUS
  Administered 2019-08-22: 1 mg via INTRAVENOUS

## 2019-08-22 MED ORDER — GABAPENTIN 300 MG PO CAPS
300.0000 mg | ORAL_CAPSULE | ORAL | Status: AC
  Administered 2019-08-22: 300 mg via ORAL

## 2019-08-22 MED ORDER — DEXAMETHASONE SODIUM PHOSPHATE 10 MG/ML IJ SOLN
INTRAMUSCULAR | Status: AC
  Filled 2019-08-22: qty 1

## 2019-08-22 MED ORDER — ONDANSETRON HCL 4 MG/2ML IJ SOLN
INTRAMUSCULAR | Status: DC | PRN
  Administered 2019-08-22: 4 mg via INTRAVENOUS

## 2019-08-22 MED ORDER — BUPIVACAINE LIPOSOME 1.3 % IJ SUSP: 20.0000 mL | Freq: Once | INTRAMUSCULAR | Status: DC

## 2019-08-22 MED ORDER — MIDAZOLAM HCL 2 MG/2ML IJ SOLN
INTRAMUSCULAR | Status: AC
  Filled 2019-08-22: qty 2

## 2019-08-22 MED ORDER — OXYCODONE HCL 5 MG/5ML PO SOLN: 5.0000 mg | Freq: Once | ORAL | Status: DC | PRN

## 2019-08-22 MED ORDER — BUPIVACAINE LIPOSOME 1.3 % IJ SUSP
INTRAMUSCULAR | Status: DC | PRN
  Administered 2019-08-22: 20 mL

## 2019-08-22 MED ORDER — FENTANYL CITRATE (PF) 100 MCG/2ML IJ SOLN
INTRAMUSCULAR | Status: DC | PRN
  Administered 2019-08-22 (×2): 25 ug via INTRAVENOUS
  Administered 2019-08-22: 12.5 ug via INTRAVENOUS
  Administered 2019-08-22: 25 ug via INTRAVENOUS
  Administered 2019-08-22: 12.5 ug via INTRAVENOUS

## 2019-08-22 MED ORDER — ACETAMINOPHEN 500 MG PO TABS
1000.0000 mg | ORAL_TABLET | ORAL | Status: AC
  Administered 2019-08-22: 1000 mg via ORAL

## 2019-08-22 MED ORDER — ACETAMINOPHEN 160 MG/5ML PO SOLN: 325.0000 mg | ORAL | Status: DC | PRN

## 2019-08-22 MED ORDER — PROPOFOL 500 MG/50ML IV EMUL
INTRAVENOUS | Status: AC
  Filled 2019-08-22: qty 50

## 2019-08-22 MED ORDER — DEXAMETHASONE SODIUM PHOSPHATE 4 MG/ML IJ SOLN
INTRAMUSCULAR | Status: DC | PRN
  Administered 2019-08-22: 5 mg via INTRAVENOUS

## 2019-08-22 SURGICAL SUPPLY — 36 items
BLADE HEX COATED 2.75 (ELECTRODE) IMPLANT
BRIEF STRETCH FOR OB PAD LRG (UNDERPADS AND DIAPERS) ×1 IMPLANT
COVER BACK TABLE 60X90IN (DRAPES) ×2 IMPLANT
COVER WAND RF STERILE (DRAPES) ×2 IMPLANT
DECANTER SPIKE VIAL GLASS SM (MISCELLANEOUS) ×1 IMPLANT
DRAPE HYSTEROSCOPY (DRAPE) ×2 IMPLANT
DRAPE SHEET LG 3/4 BI-LAMINATE (DRAPES) ×2 IMPLANT
DRSG PAD ABDOMINAL 8X10 ST (GAUZE/BANDAGES/DRESSINGS) ×1 IMPLANT
ELECT REM PT RETURN 9FT ADLT (ELECTROSURGICAL) ×2
ELECTRODE REM PT RTRN 9FT ADLT (ELECTROSURGICAL) ×1 IMPLANT
GAUZE SPONGE 4X4 12PLY STRL (GAUZE/BANDAGES/DRESSINGS) ×1 IMPLANT
GAUZE SPONGE 4X4 12PLY STRL LF (GAUZE/BANDAGES/DRESSINGS) ×1 IMPLANT
GLOVE BIO SURGEON STRL SZ 6.5 (GLOVE) ×4 IMPLANT
GLOVE BIOGEL PI IND STRL 7.0 (GLOVE) ×1 IMPLANT
GLOVE BIOGEL PI INDICATOR 7.0 (GLOVE) ×1
GOWN STRL REUS W/TWL 2XL LVL3 (GOWN DISPOSABLE) ×1 IMPLANT
GOWN STRL REUS W/TWL XL LVL3 (GOWN DISPOSABLE) ×4 IMPLANT
HEMOSTAT SURGICEL 4X8 (HEMOSTASIS) IMPLANT
KIT SIGMOIDOSCOPE (SET/KITS/TRAYS/PACK) IMPLANT
KIT SLIDE ONE PROLAPS HEMORR (KITS) ×2 IMPLANT
KIT TURNOVER CYSTO (KITS) ×2 IMPLANT
LEGGING LITHOTOMY PAIR STRL (DRAPES) ×2 IMPLANT
LUBRICANT JELLY K Y 4OZ (MISCELLANEOUS) ×2 IMPLANT
NEEDLE HYPO 22GX1.5 SAFETY (NEEDLE) ×2 IMPLANT
PAD ARMBOARD 7.5X6 YLW CONV (MISCELLANEOUS) IMPLANT
PENCIL BUTTON HOLSTER BLD 10FT (ELECTRODE) IMPLANT
SET BASIN DAY SURGERY F.S. (CUSTOM PROCEDURE TRAY) ×2 IMPLANT
SPONGE HEMORRHOID 8X3CM (HEMOSTASIS) IMPLANT
SUT CHROMIC 2 0 SH (SUTURE) IMPLANT
SUT CHROMIC 3 0 SH 27 (SUTURE) IMPLANT
SUT VIC AB 2-0 UR6 27 (SUTURE) IMPLANT
SYR CONTROL 10ML LL (SYRINGE) ×2 IMPLANT
TOWEL OR 17X26 10 PK STRL BLUE (TOWEL DISPOSABLE) ×2 IMPLANT
TRAY DSU PREP LF (CUSTOM PROCEDURE TRAY) ×2 IMPLANT
TUBE CONNECTING 12X1/4 (SUCTIONS) ×2 IMPLANT
YANKAUER SUCT BULB TIP NO VENT (SUCTIONS) ×2 IMPLANT

## 2019-08-22 NOTE — Anesthesia Postprocedure Evaluation (Signed)
Anesthesia Post Note  Patient: Susan Woods  Procedure(s) Performed: TRANSANAL HEMORRHOIDAL DEARTERIALIZATION (N/A Rectum)     Patient location during evaluation: PACU Anesthesia Type: MAC Level of consciousness: awake Pain management: pain level controlled Vital Signs Assessment: post-procedure vital signs reviewed and stable Respiratory status: spontaneous breathing Cardiovascular status: stable Postop Assessment: no apparent nausea or vomiting Anesthetic complications: no   No complications documented.  Last Vitals:  Vitals:   08/22/19 0900 08/22/19 0915  BP: 121/69   Pulse: (!) 55 (!) 58  Resp: 12 14  Temp:    SpO2: 97% 98%    Last Pain:  Vitals:   08/22/19 0900  TempSrc:   PainSc: 0-No pain                 John F Salome Arnt

## 2019-08-22 NOTE — Discharge Instructions (Addendum)
ANORECTAL SURGERY: POST OP INSTRUCTIONS 1. Take your usually prescribed home medications unless otherwise directed. 2. DIET: During the first few hours after surgery sip on some liquids until you are able to urinate.  It is normal to not urinate for several hours after this surgery.  If you feel uncomfortable, please contact the office for instructions.  After you are able to urinate,you may eat, if you feel like it.  Follow a light bland diet the first 24 hours after arrival home, such as soup, liquids, crackers, etc.  Be sure to include lots of fluids daily (6-8 glasses).  Avoid fast food or heavy meals, as your are more likely to get nauseated.  Eat a low fat diet the next few days after surgery.  Limit caffeine intake to 1-2 servings a day. 3. PAIN CONTROL: a. Pain is best controlled by a usual combination of several different methods TOGETHER: i. Muscle relaxation: Soak in a warm bath (or Sitz bath) three times a day and after bowel movements.  Continue to do this until all pain is resolved. ii. Over the counter pain medication iii. Prescription pain medication b. Most patients will experience some swelling and discomfort in the anus/rectal area and incisions.  Heat such as warm towels, sitz baths, warm baths, etc to help relax tight/sore spots and speed recovery.  Some people prefer to use ice, especially in the first couple days after surgery, as it may decrease the pain and swelling, or alternate between ice & heat.  Experiment to what works for you.  Swelling and bruising can take several weeks to resolve.  Pain can take even longer to completely resolve. c. It is helpful to take an over-the-counter pain medication regularly for the first few weeks.  Choose one of the following that works best for you: i. Naproxen (Aleve, etc)  Two 220mg tabs twice a day ii. Ibuprofen (Advil, etc) Three 200mg tabs four times a day (every meal & bedtime) d. A  prescription for pain medication (such as percocet,  oxycodone, hydrocodone, etc) should be given to you upon discharge.  Take your pain medication as prescribed.  i. If you are having problems/concerns with the prescription medicine (does not control pain, nausea, vomiting, rash, itching, etc), please call us [phone] to see if we need to switch you to a different pain medicine that will work better for you and/or control your side effect better. ii. If you need a refill on your pain medication, please contact your pharmacy.  They will contact our office to request authorization. Prescriptions will not be filled after 5 pm or on week-ends. 4. KEEP YOUR BOWELS REGULAR and AVOID CONSTIPATION a. The goal is one to two soft bowel movements a day.  You should at least have a bowel movement every other day. b. Avoid getting constipated.  Between the surgery and the pain medications, it is common to experience some constipation. This can be very painful after rectal surgery.  Increasing fluid intake and taking a fiber supplement (such as Metamucil, Citrucel, FiberCon, etc) 1-2 times a day regularly will usually help prevent this problem from occurring.  A stool softener like colace is also recommended.  This can be purchased over the counter at your pharmacy.  You can take it up to 3 times a day.  If you do not have a bowel movement after 24 hrs since your surgery, take one does of milk of magnesia.  If you still haven't had a bowel movement 8-12 hours after   that dose, take another dose.  If you don't have a bowel movement 48 hrs after surgery, purchase a Fleets enema from the drug store and administer gently per package instructions.  If you still are having trouble with your bowel movements after that, please call the office for further instructions. c. If you develop diarrhea or have many loose bowel movements, simplify your diet to bland foods & liquids for a few days.  Stop any stool softeners and decrease your fiber supplement.  Switching to mild  anti-diarrheal medications (Kayopectate, Pepto Bismol) can help.  If this worsens or does not improve, please call us.  5. Wound Care a. Remove your bandages before your first bowel movement or 8 hours after surgery.     b. Remove any wound packing material at this tim,e as well.  You do not need to repack the wound unless instructed otherwise.  Wear an absorbent pad or soft cotton gauze in your underwear to catch any drainage and help keep the area clean. You should change this every 2-3 hours while awake. c. Keep the area clean and dry.  Bathe / shower every day, especially after bowel movements.  Keep the area clean by showering / bathing over the incision / wound.   It is okay to soak an open wound to help wash it.  Wet wipes or showers / gentle washing after bowel movements is often less traumatic than regular toilet paper. d. You may have some styrofoam-like soft packing in the rectum which will come out with the first bowel movement.  e. You will often notice bleeding with bowel movements.  This should slow down by the end of the first week of surgery f. Expect some drainage.  This should slow down, too, by the end of the first week of surgery.  Wear an absorbent pad or soft cotton gauze in your underwear until the drainage stops. g. Do Not sit on a rubber or pillow ring.  This can make you symptoms worse.  You may sit on a soft pillow if needed.  6. ACTIVITIES as tolerated:   a. You may resume regular (light) daily activities beginning the next day--such as daily self-care, walking, climbing stairs--gradually increasing activities as tolerated.  If you can walk 30 minutes without difficulty, it is safe to try more intense activity such as jogging, treadmill, bicycling, low-impact aerobics, swimming, etc. b. Save the most intensive and strenuous activity for last such as sit-ups, heavy lifting, contact sports, etc  Refrain from any heavy lifting or straining until you are off narcotics for pain  control.   c. You may drive when you are no longer taking prescription pain medication, you can comfortably sit for long periods of time, and you can safely maneuver your car and apply brakes. d. You may have sexual intercourse when it is comfortable.  7. FOLLOW UP in our office a. Please call [facility] at [phone] to set up an appointment to see your surgeon in the office for a follow-up appointment approximately 3-4 weeks after your surgery. b. Make sure that you call for this appointment the day you arrive home to insure a convenient appointment time. 10. IF YOU HAVE DISABILITY OR FAMILY LEAVE FORMS, BRING THEM TO THE OFFICE FOR PROCESSING.  DO NOT GIVE THEM TO YOUR DOCTOR.     WHEN TO CALL US [phone]: 1. Poor pain control 2. Reactions / problems with new medications (rash/itching, nausea, etc)  3. Fever over 101.5 F (38.5 C) 4.   Inability to urinate 5. Nausea and/or vomiting 6. Worsening swelling or bruising 7. Continued bleeding from incision. 8. Increased pain, redness, or drainage from the incision  The clinic staff is available to answer your questions during regular business hours (8:30am-5pm).  Please don't hesitate to call and ask to speak to one of our nurses for clinical concerns.   A surgeon from [facility] is always on call at the hospitals   If you have a medical emergency, go to the nearest emergency room or call 911.    [facility], [place], [place], [place], [place]  [address] ? MAIN: [phone] ? TOLL FREE: [phone] ? FAX [phone] [email or web address]   Information for Discharge Teaching: EXPAREL (bupivacaine liposome injectable suspension)   Your surgeon or anesthesiologist gave you EXPAREL(bupivacaine) to help control your pain after surgery.   EXPAREL is a local anesthetic that provides pain relief by numbing the tissue around the surgical site.  EXPAREL is designed to release  pain medication over time and can control pain for up to 72 hours.  Depending on how you respond to EXPAREL, you may require less pain medication during your recovery.  Possible side effects:  Temporary loss of sensation or ability to move in the area where bupivacaine was injected.  Nausea, vomiting, constipation  Rarely, numbness and tingling in your mouth or lips, lightheadedness, or anxiety may occur.  Call your doctor right away if you think you may be experiencing any of these sensations, or if you have other questions regarding possible side effects.  Follow all other discharge instructions given to you by your surgeon or nurse. Eat a healthy diet and drink plenty of water or other fluids.  If you return to the hospital for any reason within 96 hours following the administration of EXPAREL, it is important for health care providers to know that you have received this anesthetic. A teal colored band has been placed on your arm with the date, time and amount of EXPAREL you have received in order to alert and inform your health care providers. Please leave this armband in place for the full 96 hours following administration, and then you may remove the band. Post Anesthesia Home Care Instructions  Activity: Get plenty of rest for the remainder of the day. A responsible individual must stay with you for 24 hours following the procedure.  For the next 24 hours, DO NOT: -Drive a car -Paediatric nurse -Drink alcoholic beverages -Take any medication unless instructed by your physician -Make any legal decisions or sign important papers.  Meals: Start with liquid foods such as gelatin or soup. Progress to regular foods as tolerated. Avoid greasy, spicy, heavy foods. If nausea and/or vomiting occur, drink only clear liquids until the nausea and/or vomiting subsides. Call your physician if vomiting continues.  Special Instructions/Symptoms: Your throat may feel dry or sore from the  anesthesia or the breathing tube placed in your throat during surgery. If this causes discomfort, gargle with warm salt water. The discomfort should disappear within 24 hours.  If you had a scopolamine patch placed behind your ear for the management of post- operative nausea and/or vomiting:  1. The medication in the patch is effective for 72 hours, after which it should be removed.  Wrap patch in a tissue and discard in the trash. Wash hands thoroughly with soap and water. 2. You may remove the patch earlier than 72 hours if you experience unpleasant side effects which may include  dry mouth, dizziness or visual disturbances. 3. Avoid touching the patch. Wash your hands with soap and water after contact with the patch.

## 2019-08-22 NOTE — Anesthesia Preprocedure Evaluation (Signed)
Anesthesia Evaluation  Patient identified by MRN, date of birth, ID band Patient awake    Reviewed: Allergy & Precautions, NPO status , Patient's Chart, lab work & pertinent test results, reviewed documented beta blocker date and time   Airway Mallampati: I       Dental no notable dental hx. (+) Teeth Intact   Pulmonary former smoker,    Pulmonary exam normal breath sounds clear to auscultation       Cardiovascular hypertension, Pt. on medications and Pt. on home beta blockers Normal cardiovascular exam Rhythm:Regular Rate:Normal     Neuro/Psych negative psych ROS   GI/Hepatic   Endo/Other    Renal/GU   negative genitourinary   Musculoskeletal   Abdominal Normal abdominal exam  (+)   Peds  Hematology   Anesthesia Other Findings   Reproductive/Obstetrics                             Anesthesia Physical Anesthesia Plan  ASA: II  Anesthesia Plan: MAC   Post-op Pain Management:    Induction:   PONV Risk Score and Plan: 2 and Ondansetron, Dexamethasone and Midazolam  Airway Management Planned: Natural Airway, Nasal Cannula, Simple Face Mask and Mask  Additional Equipment: None  Intra-op Plan:   Post-operative Plan:   Informed Consent: I have reviewed the patients History and Physical, chart, labs and discussed the procedure including the risks, benefits and alternatives for the proposed anesthesia with the patient or authorized representative who has indicated his/her understanding and acceptance.       Plan Discussed with: CRNA  Anesthesia Plan Comments:         Anesthesia Quick Evaluation

## 2019-08-22 NOTE — Anesthesia Postprocedure Evaluation (Signed)
Anesthesia Post Note  Patient: Susan Woods  Procedure(s) Performed: TRANSANAL HEMORRHOIDAL DEARTERIALIZATION (N/A Rectum)     Anesthesia Post Evaluation No complications documented.  Last Vitals:  Vitals:   08/22/19 0602  BP: (!) 146/90  Pulse: 66  Resp: 16  Temp: 36.7 C  SpO2: 100%    Last Pain:  Vitals:   08/22/19 0602  TempSrc: Oral  PainSc: 0-No pain                 Jeffrey Graefe

## 2019-08-22 NOTE — H&P (Signed)
The patient is a 74 year old female who presents with a complaint of Rectal bleeding. 74 year old female who presents to the office for evaluation of rectal bleeding and anal pain. This has been going on for several months. She has been increasing her fiber over that time. This has helped her have softer more regular bowel movements, but she continues to have some spotting and drainage. Her last colonoscopy was in 2017, normal. She has never had any hemorrhoid procedures before. She has tried a prescription hemorrhoid cream, which resolved her symptoms but did not get rid of the problem.   Past Surgical History Mammie Lorenzo, LPN; 7/34/2876 81:15 AM) Appendectomy Cesarean Section - 1 Hysterectomy (not due to cancer) - Complete Hysterectomy (not due to cancer) - Partial Oral Surgery  Diagnostic Studies History Mammie Lorenzo, LPN; 10/05/2033 59:74 AM) Colonoscopy 1-5 years ago Mammogram within last year Pap Smear >5 years ago  Allergies Mammie Lorenzo, LPN; 1/63/8453 64:68 AM) Penicillins bruising, welts Sulfa Drugs numbness  Medication History Mammie Lorenzo, LPN; 0/32/1224 82:50 AM) amLODIPine Besylate (5MG  Tablet, Oral) Active. Losartan Potassium (100MG  Tablet, Oral) Active. Metoprolol Succinate ER (100MG  Tablet ER 24HR, Oral) Active. hydroCHLOROthiazide (25MG  Tablet, Oral) Active. Omega-3 (1000MG  Capsule, Oral) Active. Multivitamin (Oral) Active. Calcium-Vitamin D (600MG  Tablet Chewable, Oral) Active. Medications Reconciled  Social History Mammie Lorenzo, LPN; 0/37/0488 89:16 AM) Alcohol use Moderate alcohol use. Caffeine use Carbonated beverages. No drug use Tobacco use Former smoker.  Family History Mammie Lorenzo, LPN; 9/45/0388 82:80 AM) Bleeding disorder Sister. Cancer Father. Diabetes Mellitus Brother, Father, Sister. Heart Disease Mother. Hypertension Brother, Mother, Sister. Malignant Neoplasm Of Pancreas Family Members In  General.  Pregnancy / Birth History Mammie Lorenzo, LPN; 0/34/9179 15:05 AM) Age at menarche 47 years. Age of menopause <47 Contraceptive History Intrauterine device, Oral contraceptives. Gravida 2 Maternal age 69-25 Para 1  Other Problems Mammie Lorenzo, LPN; 6/97/9480 16:55 AM) Back Pain Chest pain Gastroesophageal Reflux Disease Hemorrhoids High blood pressure Migraine Headache Thyroid Disease     Review of Systems  General Not Present- Appetite Loss, Chills, Fatigue, Fever, Night Sweats, Weight Gain and Weight Loss. Skin Not Present- Change in Wart/Mole, Dryness, Hives, Jaundice, New Lesions, Non-Healing Wounds, Rash and Ulcer. HEENT Present- Hoarseness, Seasonal Allergies, Sinus Pain and Visual Disturbances. Not Present- Earache, Hearing Loss, Nose Bleed, Oral Ulcers, Ringing in the Ears, Sore Throat, Wears glasses/contact lenses and Yellow Eyes. Respiratory Not Present- Bloody sputum, Chronic Cough, Difficulty Breathing, Snoring and Wheezing. Breast Not Present- Breast Mass, Breast Pain, Nipple Discharge and Skin Changes. Cardiovascular Present- Leg Cramps. Not Present- Chest Pain, Difficulty Breathing Lying Down, Palpitations, Rapid Heart Rate, Shortness of Breath and Swelling of Extremities. Gastrointestinal Present- Abdominal Pain, Bloating, Bloody Stool, Gets full quickly at meals and Hemorrhoids. Not Present- Change in Bowel Habits, Chronic diarrhea, Constipation, Difficulty Swallowing, Excessive gas, Indigestion, Nausea, Rectal Pain and Vomiting. Female Genitourinary Not Present- Frequency, Nocturia, Painful Urination, Pelvic Pain and Urgency. Musculoskeletal Present- Back Pain, Joint Pain, Joint Stiffness and Muscle Pain. Not Present- Muscle Weakness and Swelling of Extremities. Neurological Present- Headaches. Not Present- Decreased Memory, Fainting, Numbness, Seizures, Tingling, Tremor, Trouble walking and Weakness. Psychiatric Not Present- Anxiety,  Bipolar, Change in Sleep Pattern, Depression, Fearful and Frequent crying. Endocrine Not Present- Cold Intolerance, Excessive Hunger, Hair Changes, Heat Intolerance, Hot flashes and New Diabetes. Hematology Not Present- Blood Thinners, Easy Bruising, Excessive bleeding, Gland problems, HIV and Persistent Infections.  BP (!) 146/90   Pulse 66   Temp 98 F (36.7 C) (Oral)  Resp 16   Ht 5' 5.5" (1.664 m)   Wt 70.6 kg   SpO2 100%   BMI 25.50 kg/m    Physical Exam   General Mental Status-Alert. General Appearance-Not in acute distress. Build & Nutrition-Well nourished. Posture-Normal posture. Gait-Normal.  Head and Neck Head-normocephalic, atraumatic with no lesions or palpable masses. Trachea-midline.  Chest and Lung Exam Chest and lung exam reveals -normal excursion with symmetric chest walls.  Abdomen Inspection Inspection of the abdomen reveals - No Hernias. Palpation/Percussion Palpation and Percussion of the abdomen reveal - Soft, Non Tender and No Rigidity (guarding).  Neurologic Neurologic evaluation reveals -alert and oriented x 3 with no impairment of recent or remote memory, normal attention span and ability to concentrate, normal sensation and normal coordination.  Musculoskeletal Normal Exam - Bilateral-Upper Extremity Strength Normal and Lower Extremity Strength Normal.   ANOSCOPY, DIAGNOSTIC (94765) [ Hemorrhoids ] Procedure Other: Procedure: Anoscopy....Marland KitchenMarland KitchenSurgeon: Marcello Moores....Marland KitchenMarland KitchenAfter the risks and benefits were explained, verbal consent was obtained for above procedure. A medical assistant chaperone was present thoroughout the entire procedure. ....Marland KitchenMarland KitchenAnesthesia: none....Marland KitchenMarland KitchenDiagnosis: rectal bleeding....Marland KitchenMarland KitchenFindings: Grade 2 right posterior hemorrhoid, grade 3 right anterior hemorrhoid, grade 1. Left lateral hemorrhoid  Performed: 06/24/2019 11:37 AM    Assessment & Plan   PROLAPSED INTERNAL HEMORRHOIDS, GRADE  3 (Y65.0) Impression: 74 year old female who presents to the office with rectal bleeding and rectal pain. On exam today she has a grade 3 right anterior hemorrhoid and a grade 2 right posterior hemorrhoid. We discussed rubber band ligation as an option to control her bleeding. We discussed the success rate is between 60 and 70%. We also discussed hemorrhoidectomy and trans hemorrhoidal dearterialization. We discussed the typical postoperative pain associated with both surgeries. We discussed the typical recurrence rate for prolapse and rectal bleeding. She has elected to proceed with [place]. We will schedule this at her convenience

## 2019-08-22 NOTE — Transfer of Care (Signed)
Immediate Anesthesia Transfer of Care Note  Patient: Susan Woods  Procedure(s) Performed: Procedure(s) (LRB): TRANSANAL HEMORRHOIDAL DEARTERIALIZATION (N/A)  Patient Location: PACU  Anesthesia Type: General  Level of Consciousness: awake, sedated, patient cooperative and responds to stimulation  Airway & Oxygen Therapy: Patient Spontanous Breathing and Patient connected to RA and soft FM   Post-op Assessment: Report given to PACU RN, Post -op Vital signs reviewed and stable and Patient moving all extremities  Post vital signs: Reviewed and stable  Complications: No apparent anesthesia complications

## 2019-08-22 NOTE — Anesthesia Procedure Notes (Signed)
Procedure Name: MAC Date/Time: 08/22/2019 7:26 AM Performed by: Justice Rocher, CRNA Pre-anesthesia Checklist: Patient identified, Emergency Drugs available, Suction available, Patient being monitored and Timeout performed Patient Re-evaluated:Patient Re-evaluated prior to induction Oxygen Delivery Method: Simple face mask Preoxygenation: Pre-oxygenation with 100% oxygen Induction Type: IV induction Placement Confirmation: breath sounds checked- equal and bilateral,  CO2 detector and positive ETCO2

## 2019-08-22 NOTE — Op Note (Signed)
08/22/2019  8:22 AM  PATIENT:  Susan Woods  74 y.o. female  Patient Care Team: Tower, Wynelle Fanny, MD as PCP - General Thelma Comp, OD as Consulting Physician (Optometry)  PRE-OPERATIVE DIAGNOSIS:  PROLAPSED INTERNAL HEMORRHOID, GRADE 3  POST-OPERATIVE DIAGNOSIS:  PROLAPSED INTERNAL HEMORRHOID, GRADE 3  PROCEDURE:  TRANSANAL HEMORRHOIDAL DEARTERIALIZATION   Surgeon(s): Leighton Ruff, MD  ASSISTANT: none   ANESTHESIA:   local and MAC  EBL: 30m Total I/O In: -  Out: 10 [Blood:10]  DRAINS: none   SPECIMEN:  No Specimen  DISPOSITION OF SPECIMEN:  N/A  COUNTS:  YES  PLAN OF CARE: Discharge to home after PACU  PATIENT DISPOSITION:  PACU - hemodynamically stable.  INDICATION: 74y.o. F with rectal bleeding and grade 3 hemorrhoids.  I recommended [place]   OR FINDINGS: Grade 2 and 3 internal hemorrhoids  Description: Informed consent was confirmed. Patient underwent general anesthesia without difficulty. Patient was placed into lithotomy positioning.  The perianal region was prepped and draped in sterile fashion. Surgical time out confirmed or plan.  I placed a rectal block using Experel mixed with 0.5% Marcaine with Epinephrine.    I did digital rectal examination and then transitioned over to anoscopy to get a sense of the anatomy.  I switched over to the [facility] Incfiberoptically lit Doppler anocope.   Using the Doppler on the tip of the [place], I identified the arterial hemorrhoidal vessels coming in in the classic hexagonal anatomical pattern  (right posterior/lateral/anterior, left posterior /lateral/anterior).    I proceeded to ligate the hemorrhoidal arteries. I used a 2-0 Vicryl suture on a UR-6 needle in a figure-of-eight fashion over the signal around 6 cm proximal to the anal verge. I then ran that stitch longitudinally more distally to the dentate line. I then tied that stitch down to cause a hemorrhoidopexy. I did that for all 6 locations.    I  redid Doppler anoscopy. I Identified a signal at the anterior midline and right anterior locations.  I isolated and ligated both of these with a figure-of-eight stitches. Signals went away.  At completion of this, all hemorrhoids were reduced into the rectum.  There is no more prolapse. External anatomy looked normal.  I repeated anoscopy and examination.   Hemostasis was good. I placed a soft Gelfoam cylinder into the rectum. Patient is being extubated go to recovery room.  I am about to discuss the patient's status to the family.

## 2019-08-25 ENCOUNTER — Encounter (HOSPITAL_BASED_OUTPATIENT_CLINIC_OR_DEPARTMENT_OTHER): Payer: Self-pay | Admitting: General Surgery

## 2019-08-27 ENCOUNTER — Other Ambulatory Visit: Payer: Self-pay | Admitting: General Surgery

## 2019-10-01 DIAGNOSIS — H3561 Retinal hemorrhage, right eye: Secondary | ICD-10-CM | POA: Diagnosis not present

## 2019-10-01 DIAGNOSIS — H33193 Other retinoschisis and retinal cysts, bilateral: Secondary | ICD-10-CM | POA: Diagnosis not present

## 2019-10-01 DIAGNOSIS — H43813 Vitreous degeneration, bilateral: Secondary | ICD-10-CM | POA: Diagnosis not present

## 2019-10-01 DIAGNOSIS — H35372 Puckering of macula, left eye: Secondary | ICD-10-CM | POA: Diagnosis not present

## 2019-11-12 ENCOUNTER — Other Ambulatory Visit: Payer: Self-pay | Admitting: Family Medicine

## 2019-12-17 ENCOUNTER — Other Ambulatory Visit: Payer: Self-pay

## 2019-12-17 ENCOUNTER — Ambulatory Visit (INDEPENDENT_AMBULATORY_CARE_PROVIDER_SITE_OTHER): Payer: Medicare Other

## 2019-12-17 DIAGNOSIS — Z23 Encounter for immunization: Secondary | ICD-10-CM

## 2020-01-21 DIAGNOSIS — Z1231 Encounter for screening mammogram for malignant neoplasm of breast: Secondary | ICD-10-CM | POA: Diagnosis not present

## 2020-01-22 ENCOUNTER — Encounter: Payer: Self-pay | Admitting: Family Medicine

## 2020-01-22 ENCOUNTER — Ambulatory Visit (INDEPENDENT_AMBULATORY_CARE_PROVIDER_SITE_OTHER): Payer: Medicare Other | Admitting: Family Medicine

## 2020-01-22 VITALS — BP 130/80 | HR 55 | Temp 96.9°F | Ht 64.25 in | Wt 152.5 lb

## 2020-01-22 DIAGNOSIS — M810 Age-related osteoporosis without current pathological fracture: Secondary | ICD-10-CM

## 2020-01-22 DIAGNOSIS — E039 Hypothyroidism, unspecified: Secondary | ICD-10-CM

## 2020-01-22 DIAGNOSIS — Z0000 Encounter for general adult medical examination without abnormal findings: Secondary | ICD-10-CM

## 2020-01-22 DIAGNOSIS — I10 Essential (primary) hypertension: Secondary | ICD-10-CM | POA: Diagnosis not present

## 2020-01-22 DIAGNOSIS — H35033 Hypertensive retinopathy, bilateral: Secondary | ICD-10-CM

## 2020-01-22 LAB — VITAMIN D 25 HYDROXY (VIT D DEFICIENCY, FRACTURES): VITD: 26.1 ng/mL — ABNORMAL LOW (ref 30.00–100.00)

## 2020-01-22 LAB — LIPID PANEL
Cholesterol: 145 mg/dL (ref 0–200)
HDL: 50.1 mg/dL (ref >39.00)
LDL Cholesterol: 78 mg/dL (ref 0–99)
NonHDL: 94.77
Total CHOL/HDL Ratio: 3
Triglycerides: 85 mg/dL (ref 0.0–149.0)
VLDL: 17 mg/dL (ref 0.0–40.0)

## 2020-01-22 LAB — CBC WITH DIFFERENTIAL/PLATELET
Basophils Absolute: 0 10*3/uL (ref 0.0–0.1)
Basophils Relative: 0.7 % (ref 0.0–3.0)
Eosinophils Absolute: 0.1 10*3/uL (ref 0.0–0.7)
Eosinophils Relative: 2.1 % (ref 0.0–5.0)
HCT: 37.2 % (ref 36.0–46.0)
Hemoglobin: 12.4 g/dL (ref 12.0–15.0)
Lymphocytes Relative: 44.8 % (ref 12.0–46.0)
Lymphs Abs: 2.7 10*3/uL (ref 0.7–4.0)
MCHC: 33.4 g/dL (ref 30.0–36.0)
MCV: 87.5 fl (ref 78.0–100.0)
Monocytes Absolute: 0.4 10*3/uL (ref 0.1–1.0)
Monocytes Relative: 7.4 % (ref 3.0–12.0)
Neutro Abs: 2.7 10*3/uL (ref 1.4–7.7)
Neutrophils Relative %: 45 % (ref 43.0–77.0)
Platelets: 240 10*3/uL (ref 150.0–400.0)
RBC: 4.25 Mil/uL (ref 3.87–5.11)
RDW: 14.3 % (ref 11.5–15.5)
WBC: 5.9 10*3/uL (ref 4.0–10.5)

## 2020-01-22 LAB — COMPREHENSIVE METABOLIC PANEL
ALT: 8 U/L (ref 0–35)
AST: 17 U/L (ref 0–37)
Albumin: 4.4 g/dL (ref 3.5–5.2)
Alkaline Phosphatase: 71 U/L (ref 39–117)
BUN: 15 mg/dL (ref 6–23)
CO2: 31 mEq/L (ref 19–32)
Calcium: 9.4 mg/dL (ref 8.4–10.5)
Chloride: 102 mEq/L (ref 96–112)
Creatinine, Ser: 0.85 mg/dL (ref 0.40–1.20)
GFR: 68.51 mL/min (ref >60.00)
Glucose, Bld: 93 mg/dL (ref 70–99)
Potassium: 3.7 mEq/L (ref 3.5–5.1)
Sodium: 140 mEq/L (ref 135–145)
Total Bilirubin: 0.9 mg/dL (ref 0.2–1.2)
Total Protein: 7.1 g/dL (ref 6.0–8.3)

## 2020-01-22 LAB — TSH: TSH: 0.48 u[IU]/mL (ref 0.35–4.50)

## 2020-01-22 MED ORDER — HYDROCHLOROTHIAZIDE 25 MG PO TABS
25.0000 mg | ORAL_TABLET | Freq: Every day | ORAL | 3 refills | Status: DC
Start: 2020-01-22 — End: 2021-01-27

## 2020-01-22 MED ORDER — METOPROLOL SUCCINATE ER 100 MG PO TB24
ORAL_TABLET | ORAL | 3 refills | Status: DC
Start: 2020-01-22 — End: 2021-01-27

## 2020-01-22 MED ORDER — CYCLOBENZAPRINE HCL 10 MG PO TABS
ORAL_TABLET | ORAL | 3 refills | Status: DC
Start: 2020-01-22 — End: 2021-11-09

## 2020-01-22 MED ORDER — AMLODIPINE BESYLATE 5 MG PO TABS
5.0000 mg | ORAL_TABLET | Freq: Every day | ORAL | 3 refills | Status: DC
Start: 2020-01-22 — End: 2021-01-27

## 2020-01-22 MED ORDER — LOSARTAN POTASSIUM 100 MG PO TABS
100.0000 mg | ORAL_TABLET | Freq: Every day | ORAL | 3 refills | Status: DC
Start: 2020-01-22 — End: 2021-01-27

## 2020-01-22 MED ORDER — FLUTICASONE PROPIONATE 50 MCG/ACT NA SUSP
2.0000 | Freq: Every day | NASAL | 3 refills | Status: DC
Start: 2020-01-22 — End: 2021-04-29

## 2020-01-22 MED ORDER — LEVOCETIRIZINE DIHYDROCHLORIDE 5 MG PO TABS
5.0000 mg | ORAL_TABLET | Freq: Every day | ORAL | 3 refills | Status: DC | PRN
Start: 2020-01-22 — End: 2021-04-29

## 2020-01-22 NOTE — Assessment & Plan Note (Signed)
utd eye exams  No vision change bp is well controlled

## 2020-01-22 NOTE — Progress Notes (Signed)
Subjective:    Patient ID: Susan Woods, female    DOB: 04-29-1945, 74 y.o.   MRN: [id number]  This visit occurred during the SARS-CoV-2 public health emergency.  Safety protocols were in place, including screening questions prior to the visit, additional usage of staff PPE, and extensive cleaning of exam room while observing appropriate contact time as indicated for disinfecting solutions.    HPI Pt presents for amw and health mt exam with rev of chronic medical problems   I have personally reviewed the Medicare Annual Wellness questionnaire and have noted 1. The patient's medical and social history 2. Their use of alcohol, tobacco or illicit drugs 3. Their current medications and supplements 4. The patient's functional ability including ADL's, fall risks, home safety risks and hearing or visual             impairment. 5. Diet and physical activities 6. Evidence for depression or mood disorders  The patients weight, height, BMI have been recorded in the chart and visual acuity is per eye clinic.  I have made referrals, counseling and provided education to the patient based review of the above and I have provided the pt with a written personalized care plan for preventive services. Reviewed and updated provider list, see scanned forms.  See scanned forms.  Routine anticipatory guidance given to patient.  See health maintenance. Colon cancer screening  2/17 colonoscopy , wants to do cologaurd kit  Breast cancer screening  Mammogram yesterday-pend result  (had some trauma and a sore R lat breast)  Self breast exam- no lumps  Flu vaccine 10/21 Tetanus vaccine  Tdap 10/17 covid vaccinated moderna-is not going to do a booster  Pneumovax-complete  Zoster vaccine-zostavax 1/13   Dexa  11/14- not open to treatment or dexa  Falls- none  Fractures-none new Supplements- D Exercise -good   Advance directive- up to date  Cognitive function addressed- see scanned forms- and if  abnormal then additional documentation follows.  No issues with memory/cognition  occ misplaces  Handles her own affairs   PMH and SH reviewed  Meds, vitals, and allergies reviewed.   ROS: See HPI.  Otherwise negative.    Weight : Wt Readings from Last 3 Encounters:  01/22/20 152 lb 8 oz (69.2 kg)  08/22/19 155 lb 9.6 oz (70.6 kg)  06/16/19 154 lb (69.9 kg)  great weight  25.97 kg/m  Hearing/vision:  Hearing Screening   _0  _1  _2  _3  _4  _5  _6  _7  _8   Right ear:   40 40 40  0    Left ear:   40 40 40  40    Vision Screening Comments: [facility] exam scheduled [facility] tomorrow 01/23/20  Missed highest tone in R ear only    Care team Cherrelle Plante-pcp Bulakowski- opt  Has been feeling good  Swimming/ staying active   HTN bp is stable today  No cp or palpitations or headaches or edema  No side effects to medicines  BP Readings from Last 3 Encounters:  01/22/20 (!) 144/78  08/22/19 (!) 143/82  06/16/19 136/84    Takes amlodipine 5 mg daily hctz 25 mg daily  Losartan 100 mg daily Metoprolol xl 100 mg daily  Pulse Readings from Last 3 Encounters:  01/22/20 (!) 55  08/22/19 61  06/16/19 86     Hypothyroidism  Pt has no clinical changes No change in energy level/ hair or skin/ edema and no tremor Lab Results  Component Value Date   TSH 0.67 01/16/2019  Due for labs  No current thyroid medication   Patient Active Problem List   Diagnosis Date Noted  . Bilateral groin pain 06/16/2019  . Rectal bleeding 04/21/2019  . Muscle cramps 04/15/2018  . Internal hemorrhoids 04/27/2016  . Hypothyroid 01/13/2016  . 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 Colon cancer screening 12/22/2014  . Joint pain 08/26/2014  . Low back pain 05/18/2014  . Hypertensive retinopathy of both eyes, grade 1 12/16/2013  . Encounter for Medicare annual wellness exam 12/31/2012  . Routine general medical examination at a health care facility 02/20/2011  . MENINGIOMA  11/17/2009  . Osteoporosis 03/19/2007  . HYPOGLYCEMIA 06/12/2006  . Essential hypertension 06/12/2006  . Allergic rhinitis 06/12/2006  . GERD 06/12/2006   Past Medical History:  Diagnosis Date  . Anemia   . Arthritis    in hips  . GERD (gastroesophageal reflux disease)   . History of hypothyroidism    no current meds  . HTN (hypertension)    echo (8/11): EF 55%, mild MR, mild TR  . Nasal pruritis    full body w/o rash   . Neuromuscular disorder ([place])    cramps occ after working out  . Nonallergic rhinitis   . Osteoporosis   . Psoriasis    mild intermittent   . Tingling    in hands of ? etiol   Past Surgical History:  Procedure Laterality Date  . 2D echo  8/11   nml   . ABDOMINAL HYSTERECTOMY  1975   with partial 1 ovary removed  . ABDOMINAL SURGERY  1976   adhesions  . CESAREAN SECTION  1971  . COLONOSCOPY  2006  . dexa - osteopenia  2005   dexa 2009 - osteoporosis/fairly stable   . hosp TIA  10/2009   no problems since, told in hospital not tia  . NASAL SINUS SURGERY  2002  . OOPHORECTOMY    . ORIF FINGER / THUMB FRACTURE Right 02/2005  . ovary removed  1976  . removal of internal stitch that did not disolve  1977  . TRANSANAL HEMORRHOIDAL DEARTERIALIZATION N/A 08/22/2019   Procedure: TRANSANAL HEMORRHOIDAL DEARTERIALIZATION;  Surgeon: Leighton Ruff, MD;  [facility];  Service: General;  Laterality: N/A;  . 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 VESICOVAGINAL FISTULA CLOSURE W/ TAH  1975   Social History   Tobacco Use  . Smoking status: Former Smoker    Years: 5.00    Types: Cigarettes    Quit date: 04/08/1987    Years since quitting: 32.8  . Smokeless tobacco: Never Used  . Tobacco comment: 1-2 cig per day  Vaping Use  . Vaping Use: Never used  Substance Use Topics  . Alcohol use: Yes    Alcohol/week: 0.0 standard drinks    Comment: rarely  . Drug use: No   Family History  Problem Relation Age of Onset  . Cancer  Father        throat CA  . Diabetes Father   . Heart disease Mother        MI late 84s - CHF   . Hypertension Mother   . Stroke Brother   . Diabetes Brother   . Heart disease Sister        CHF  . Diabetes Sister   . Hypertension Brother   . Diabetes Brother    Allergies  Allergen Reactions  . Amlodipine Swelling    Ankle swelling  . Azithromycin  Heart palpitations on z pack  . Levaquin [Levofloxacin In D5w] Other (See Comments)    Muscle pain   . Penicillins     REACTION: urticaria (hives), knots in abdomen easy bruising  . Shrimp [Shellfish Allergy]     Throat tries to close  . Sulfonamide Derivatives     REACTION: tingling around mouth and hands felt tight  . Povidone-Iodine Hives and Itching   Current Outpatient Medications on File Prior to Visit  Medication Sig Dispense Refill  . Calcium Carb-Cholecalciferol (CALCIUM 600 + D PO) Take 1 tablet by mouth daily.     . Cholecalciferol (VITAMIN D3) 1000 UNITS CAPS Take 1 capsule by mouth daily.      . Ferrous Sulfate (FE-CAPS) 250 MG CPCR Take 1 capsule by mouth daily.      . fish oil-omega-3 fatty acids 1000 MG capsule Take 2 g by mouth daily. Alternate with flax oil  500 mg in am    . Multiple Vitamin (MULTIVITAMIN) tablet Take 1 tablet by mouth daily.      . NON FORMULARY similisan dry eye drops daily in am     No current facility-administered medications on file prior to visit.    Review of Systems  Constitutional: Negative for activity change, appetite change, fatigue, fever and unexpected weight change.  HENT: Negative for congestion, ear pain, rhinorrhea, sinus pressure and sore throat.   Eyes: Negative for pain, redness and visual disturbance.  Respiratory: Negative for cough, shortness of breath and wheezing.   Cardiovascular: Negative for chest pain and palpitations.  Gastrointestinal: Negative for abdominal pain, blood in stool, constipation and diarrhea.  Endocrine: Negative for polydipsia and polyuria.   Genitourinary: Negative for dysuria, frequency and urgency.  Musculoskeletal: Positive for arthralgias. Negative for back pain and myalgias.  Skin: Negative for pallor and rash.  Allergic/Immunologic: Negative for environmental allergies.  Neurological: Negative for dizziness, syncope and headaches.  Hematological: Negative for adenopathy. Does not bruise/bleed easily.  Psychiatric/Behavioral: Negative for decreased concentration and dysphoric mood. The patient is not nervous/anxious.        Objective:   Physical Exam Constitutional:      General: She is not in acute distress.    Appearance: Normal appearance. She is well-developed and normal weight. She is not ill-appearing or diaphoretic.  HENT:     Head: Normocephalic and atraumatic.     Right Ear: Tympanic membrane, ear canal and external ear normal.     Left Ear: Tympanic membrane, ear canal and external ear normal.     Nose: Nose normal. No congestion.     Mouth/Throat:     Mouth: Mucous membranes are moist.     Pharynx: Oropharynx is clear. No posterior oropharyngeal erythema.  Eyes:     General: No scleral icterus.    Extraocular Movements: Extraocular movements intact.     Conjunctiva/sclera: Conjunctivae normal.     Pupils: Pupils are equal, round, and reactive to light.  Neck:     Thyroid: No thyromegaly.     Vascular: No carotid bruit or JVD.  Cardiovascular:     Rate and Rhythm: Normal rate and regular rhythm.     Pulses: Normal pulses.     Heart sounds: Normal heart sounds. No gallop.   Pulmonary:     Effort: Pulmonary effort is normal. No respiratory distress.     Breath sounds: Normal breath sounds. No wheezing.     Comments: Good air exch Chest:     Chest wall: No tenderness.  Abdominal:  General: Bowel sounds are normal. There is no distension or abdominal bruit.     Palpations: Abdomen is soft. There is no mass.     Tenderness: There is no abdominal tenderness.     Hernia: No hernia is present.   Genitourinary:    Comments: Breast exam: No mass, nodules, thickening, tenderness, bulging, retraction, inflamation, nipple discharge or skin changes noted.  No axillary or clavicular LA.     Musculoskeletal:        General: No tenderness. Normal range of motion.     Cervical back: Normal range of motion and neck supple. No rigidity. No muscular tenderness.     Right lower leg: No edema.     Left lower leg: No edema.  Lymphadenopathy:     Cervical: No cervical adenopathy.  Skin:    General: Skin is warm and dry.     Coloration: Skin is not pale.     Findings: No erythema or rash.  Neurological:     Mental Status: She is alert. Mental status is at baseline.     Cranial Nerves: No cranial nerve deficit.     Motor: No abnormal muscle tone.     Coordination: Coordination normal.     Gait: Gait normal.     Deep Tendon Reflexes: Reflexes are normal and symmetric. Reflexes normal.  Psychiatric:        Mood and Affect: Mood normal.        Cognition and Memory: Cognition and memory normal.           Assessment & Plan:   Problem List Items Addressed This Visit      Cardiovascular and Mediastinum   Essential hypertension    bp in fair control at this time  BP Readings from Last 1 Encounters:  01/22/20 130/80   No changes needed Most recent labs reviewed  Disc lifstyle change with low sodium diet and exercise  Labs today Plans to continue amlodipine 5 mg, hctz 25 mg , losartan 100 mg and metoprolol xl 100 mg daily       Relevant Medications   amLODipine (NORVASC) 5 MG tablet   hydrochlorothiazide (HYDRODIURIL) 25 MG tablet   losartan (COZAAR) 100 MG tablet   metoprolol succinate (TOPROL-XL) 100 MG 24 hr tablet   Other Relevant Orders   Comprehensive metabolic panel   Lipid panel   TSH   CBC with Differential/Platelet     Endocrine   Hypothyroid    Hypothyroidism  Pt has no clinical changes No change in energy level/ hair or skin/ edema and no tremor Lab Results   Component Value Date   TSH 0.67 01/16/2019     TSH re checked today  Not currently taking medication       Relevant Medications   metoprolol succinate (TOPROL-XL) 100 MG 24 hr tablet   Other Relevant Orders   TSH     Musculoskeletal and Integument   Osteoporosis    Declines dexa or any tx Last 11/14 No falls or fx  Good exercise  Disc need for calcium/ vitamin D/ wt bearing exercise and bone density test every 2 y to monitor Disc safety/ fracture risk in detail        Relevant Orders   VITAMIN D 25 Hydroxy (Vit-D Deficiency, Fractures)     Other   Routine general medical examination at a health care facility    Reviewed health habits including diet and exercise and skin cancer prevention Reviewed appropriate screening tests for age  Also reviewed health mt list, fam hx and immunization status , as well as social and family history   See HPI Labs ordered  Plans to do cologuard for screening  Mammogram yesterday-pend report  Planning booster for covid shot  Considering shingrix if covered  Declines dexa or any tx for OP No falls or fx and good exercise  Adv directive is up to date  No cognitive concerns Fairly good hearing screen and up to date with vision/eye exam        Encounter for Medicare annual wellness exam - Primary    Reviewed health habits including diet and exercise and skin cancer prevention Reviewed appropriate screening tests for age  Also reviewed health mt list, fam hx and immunization status , as well as social and family history   See HPI Labs ordered  Plans to do cologuard for screening  Mammogram yesterday-pend report  Planning booster for covid shot  Considering shingrix if covered  Declines dexa or any tx for OP No falls or fx and good exercise  Adv directive is up to date  No cognitive concerns Fairly good hearing screen and up to date with vision/eye exam      Hypertensive retinopathy of both eyes, grade 1    utd eye exams  No  vision change bp is well controlled

## 2020-01-22 NOTE — Assessment & Plan Note (Addendum)
bp in fair control at this time  BP Readings from Last 1 Encounters:  01/22/20 130/80   No changes needed Most recent labs reviewed  Disc lifstyle change with low sodium diet and exercise  Labs today Plans to continue amlodipine 5 mg, hctz 25 mg , losartan 100 mg and metoprolol xl 100 mg daily

## 2020-01-22 NOTE — Patient Instructions (Addendum)
I do recommend a covid booster  COVID-19 Vaccine Information can be found at: [email or web address] For questions related to vaccine distribution or appointments, please email [email or web address] or call [phone].   If you are interested in the new shingles vaccine (Shingrix) - call your local pharmacy to check on coverage and availability  If affordable, get on a wait list at your pharmacy to get the vaccine.  We will sign you up for the cologuard program   Labs today

## 2020-01-22 NOTE — Assessment & Plan Note (Signed)
Reviewed health habits including diet and exercise and skin cancer prevention Reviewed appropriate screening tests for age  Also reviewed health mt list, fam hx and immunization status , as well as social and family history   See HPI Labs ordered  Plans to do cologuard for screening  Mammogram yesterday-pend report  Planning booster for covid shot  Considering shingrix if covered  Declines dexa or any tx for OP No falls or fx and good exercise  Adv directive is up to date  No cognitive concerns Fairly good hearing screen and up to date with vision/eye exam

## 2020-01-22 NOTE — Assessment & Plan Note (Signed)
Hypothyroidism  Pt has no clinical changes No change in energy level/ hair or skin/ edema and no tremor Lab Results  Component Value Date   TSH 0.67 01/16/2019     TSH re checked today  Not currently taking medication

## 2020-01-22 NOTE — Assessment & Plan Note (Signed)
Declines dexa or any tx Last 11/14 No falls or fx  Good exercise  Disc need for calcium/ vitamin D/ wt bearing exercise and bone density test every 2 y to monitor Disc safety/ fracture risk in detail

## 2020-01-23 DIAGNOSIS — H2513 Age-related nuclear cataract, bilateral: Secondary | ICD-10-CM | POA: Diagnosis not present

## 2020-01-23 DIAGNOSIS — H524 Presbyopia: Secondary | ICD-10-CM | POA: Diagnosis not present

## 2020-01-23 DIAGNOSIS — H25013 Cortical age-related cataract, bilateral: Secondary | ICD-10-CM | POA: Diagnosis not present

## 2020-01-23 DIAGNOSIS — H59812 Chorioretinal scars after surgery for detachment, left eye: Secondary | ICD-10-CM | POA: Diagnosis not present

## 2020-01-23 DIAGNOSIS — H04123 Dry eye syndrome of bilateral lacrimal glands: Secondary | ICD-10-CM | POA: Diagnosis not present

## 2020-03-23 ENCOUNTER — Ambulatory Visit: Payer: Medicare Other | Attending: Internal Medicine

## 2020-03-23 ENCOUNTER — Other Ambulatory Visit: Payer: Self-pay | Admitting: Internal Medicine

## 2020-03-23 DIAGNOSIS — Z23 Encounter for immunization: Secondary | ICD-10-CM

## 2020-03-23 NOTE — Progress Notes (Signed)
   Covid-19 Vaccination Clinic  Name:  Susan Woods    MRN: [id number] DOB: July 03, 1945  03/23/2020  [name] was observed post Covid-19 immunization for 15 minutes without incident. She was provided with Vaccine Information Sheet and instruction to access the V-Safe system.   [name] was instructed to call 911 with any severe reactions post vaccine: Marland Kitchen Difficulty breathing  . Swelling of face and throat  . A fast heartbeat  . A bad rash all over body  . Dizziness and weakness   Immunizations Administered    Name Date Dose VIS Date Route   Moderna Covid-19 Booster Vaccine 03/23/2020 10:33 AM 0.25 mL 12/31/2019 Intramuscular   Manufacturer: Moderna   Lot: [id number]   Solen: 16967-893-81

## 2020-05-11 ENCOUNTER — Ambulatory Visit: Payer: Medicare Other | Admitting: Family Medicine

## 2020-05-13 ENCOUNTER — Encounter: Payer: Self-pay | Admitting: Family Medicine

## 2020-05-13 ENCOUNTER — Other Ambulatory Visit: Payer: Self-pay

## 2020-05-13 ENCOUNTER — Ambulatory Visit (INDEPENDENT_AMBULATORY_CARE_PROVIDER_SITE_OTHER): Payer: Medicare Other | Admitting: Family Medicine

## 2020-05-13 VITALS — BP 126/74 | HR 64 | Temp 96.9°F | Ht 64.25 in | Wt 157.2 lb

## 2020-05-13 DIAGNOSIS — M5442 Lumbago with sciatica, left side: Secondary | ICD-10-CM | POA: Diagnosis not present

## 2020-05-13 NOTE — Assessment & Plan Note (Signed)
Right side this time  Suspect spasm/muscular  Rev last LS film-reassuring No neuro changes  Will re for PT Also given home exercises  Tylenol /ibuprofen prn with heat if needed Discussed sleep position  If no improvement consider films Watch for any neuro changes

## 2020-05-13 NOTE — Progress Notes (Signed)
Subjective:    Patient ID: Susan Woods, female    DOB: Jan 21, 1946, 75 y.o.   MRN: [id number]  This visit occurred during the SARS-CoV-2 public health emergency.  Safety protocols were in place, including screening questions prior to the visit, additional usage of staff PPE, and extensive cleaning of exam room while observing appropriate contact time as indicated for disinfecting solutions.    HPI Pt presents with c/o arthritis  Wt Readings from Last 3 Encounters:  05/13/20 157 lb 3 oz (71.3 kg)  01/22/20 152 lb 8 oz (69.2 kg)  08/22/19 155 lb 9.6 oz (70.6 kg)   26.77 kg/m  A week or two she is having more body pain  She exercises regularly  After a water work out it was hard to move for 2 d  Getting massage and that does help    Low back/buttock pain that rad to legs at times  Worse on the right  Sharp and dull   In am when she wakes up - worse /so she stretches and gets moving  Also getting up after sitting   No neurologic changes  No foot drop   No B/B control changes   Groin (that is chronic)  A little sore in shoulder girdle -not a big deal   Last LS film: DG Lumbar Spine Complete (Accession [id number]) (Order [id number]) Imaging Date: 05/18/2014 Department: [facility] Released By: Ellamae Sia Authorizing: Graviel Payeur, Wynelle Fanny, MD    Exam Status  Status  Final [99]   PACS Intelerad Image Link  Show images for DG Lumbar Spine Complete  Study Result  Narrative & Impression  CLINICAL DATA:  75 year old female with lumbar back pain greater on the right. Pain radiating to the sides. Initial encounter.  EXAM: LUMBAR SPINE - COMPLETE 4+ VIEW  COMPARISON:  None.  FINDINGS: Normal lumbar segmentation. Vertebral height and alignment within normal limits. Relatively preserved disc spaces. Mild endplate spurring in the upper lumbar spine. No pars fracture. sacral ala and SI joints within normal limits. Grossly intact  visible lower thoracic levels.  IMPRESSION: No acute osseous abnormality identified in the lumbar spine.   Electronically Signed   By: Genevie Ann M.D.   On: 05/18/2014 17:20     Takes occ tylenol  Has flexerill if needed at home   5/21 had mild deg changes of hips   Patient Active Problem List   Diagnosis Date Noted  . Bilateral groin pain 06/16/2019  . Rectal bleeding 04/21/2019  . Muscle cramps 04/15/2018  . Internal hemorrhoids 04/27/2016  . Hypothyroid 01/13/2016  . 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 Colon cancer screening 12/22/2014  . Joint pain 08/26/2014  . Low back pain 05/18/2014  . Hypertensive retinopathy of both eyes, grade 1 12/16/2013  . Encounter for Medicare annual wellness exam 12/31/2012  . Routine general medical examination at a health care facility 02/20/2011  . MENINGIOMA 11/17/2009  . Osteoporosis 03/19/2007  . HYPOGLYCEMIA 06/12/2006  . Essential hypertension 06/12/2006  . Allergic rhinitis 06/12/2006  . GERD 06/12/2006   Past Medical History:  Diagnosis Date  . Anemia   . Arthritis    in hips  . GERD (gastroesophageal reflux disease)   . History of hypothyroidism    no current meds  . HTN (hypertension)    echo (8/11): EF 55%, mild MR, mild TR  . Nasal pruritis    full body w/o rash   . Neuromuscular disorder (H[facility])    cramps occ after working  out  . Nonallergic rhinitis   . Osteoporosis   . Psoriasis    mild intermittent   . Tingling    in hands of ? etiol   Past Surgical History:  Procedure Laterality Date  . 2D echo  8/11   nml   . ABDOMINAL HYSTERECTOMY  1975   with partial 1 ovary removed  . ABDOMINAL SURGERY  1976   adhesions  . CESAREAN SECTION  1971  . COLONOSCOPY  2006  . dexa - osteopenia  2005   dexa 2009 - osteoporosis/fairly stable   . hosp TIA  10/2009   no problems since, told in hospital not tia  . NASAL SINUS SURGERY  2002  . OOPHORECTOMY    . ORIF FINGER / THUMB FRACTURE Right 02/2005  . ovary removed   1976  . removal of internal stitch that did not disolve  1977  . TRANSANAL HEMORRHOIDAL DEARTERIALIZATION N/A 08/22/2019   Procedure: TRANSANAL HEMORRHOIDAL DEARTERIALIZATION;  Surgeon: Leighton Ruff, MD;  Location: [facility];  Service: General;  Laterality: N/A;  . 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 VESICOVAGINAL FISTULA CLOSURE W/ TAH  1975   Social History   Tobacco Use  . Smoking status: Former Smoker    Years: 5.00    Types: Cigarettes    Quit date: 04/08/1987    Years since quitting: 33.1  . Smokeless tobacco: Never Used  . Tobacco comment: 1-2 cig per day  Vaping Use  . Vaping Use: Never used  Substance Use Topics  . Alcohol use: Yes    Alcohol/week: 0.0 standard drinks    Comment: rarely  . Drug use: No   Family History  Problem Relation Age of Onset  . Cancer Father        throat CA  . Diabetes Father   . Heart disease Mother        MI late 54s - CHF   . Hypertension Mother   . Stroke Brother   . Diabetes Brother   . Heart disease Sister        CHF  . Diabetes Sister   . Hypertension Brother   . Diabetes Brother    Allergies  Allergen Reactions  . Amlodipine Swelling    Ankle swelling  . Azithromycin     Heart palpitations on z pack  . Levaquin [Levofloxacin In D5w] Other (See Comments)    Muscle pain   . Penicillins     REACTION: urticaria (hives), knots in abdomen easy bruising  . Shrimp [Shellfish Allergy]     Throat tries to close  . Sulfonamide Derivatives     REACTION: tingling around mouth and hands felt tight  . Povidone-Iodine Hives and Itching   Current Outpatient Medications on File Prior to Visit  Medication Sig Dispense Refill  . amLODipine (NORVASC) 5 MG tablet Take 1 tablet (5 mg total) by mouth daily. 90 tablet 3  . Calcium Carb-Cholecalciferol (CALCIUM 600 + D PO) Take 1 tablet by mouth daily.     . Cholecalciferol (VITAMIN D3) 1000 UNITS CAPS Take 1 capsule by mouth daily.    . cyclobenzaprine  (FLEXERIL) 10 MG tablet TAKE 1/2-1 TABLET BY MOUTH 3 TIMES DAILY AS NEEDED FOR MUSCLE SPASMS (WATCH FOR SEDATION) 30 tablet 3  . Ferrous Sulfate 250 MG CPCR Take 1 capsule by mouth daily.    . fish oil-omega-3 fatty acids 1000 MG capsule Take 2 g by mouth daily. Alternate with flax oil  500 mg in am    . fluticasone (FLONASE) 50 MCG/ACT nasal spray Place 2 sprays into both nostrils daily. 48 g 3  . hydrochlorothiazide (HYDRODIURIL) 25 MG tablet Take 1 tablet (25 mg total) by mouth daily. 90 tablet 3  . levocetirizine (XYZAL) 5 MG tablet Take 1 tablet (5 mg total) by mouth daily as needed. 90 tablet 3  . losartan (COZAAR) 100 MG tablet Take 1 tablet (100 mg total) by mouth daily. 90 tablet 3  . metoprolol succinate (TOPROL-XL) 100 MG 24 hr tablet Take with or immediately following a meal. 90 tablet 3  . Multiple Vitamin (MULTIVITAMIN) tablet Take 1 tablet by mouth daily.    . NON FORMULARY similisan dry eye drops daily in am     No current facility-administered medications on file prior to visit.    Review of Systems  Constitutional: Negative for activity change, appetite change, fatigue, fever and unexpected weight change.  HENT: Negative for congestion, ear pain, rhinorrhea, sinus pressure and sore throat.   Eyes: Negative for pain, redness and visual disturbance.  Respiratory: Negative for cough, shortness of breath and wheezing.   Cardiovascular: Negative for chest pain and palpitations.  Gastrointestinal: Negative for abdominal pain, blood in stool, constipation and diarrhea.  Endocrine: Negative for polydipsia and polyuria.  Genitourinary: Negative for dysuria, frequency and urgency.  Musculoskeletal: Positive for back pain. Negative for arthralgias, gait problem, joint swelling and myalgias.  Skin: Negative for pallor and rash.  Allergic/Immunologic: Negative for environmental allergies.  Neurological: Negative for dizziness, syncope and headaches.  Hematological: Negative for  adenopathy. Does not bruise/bleed easily.  Psychiatric/Behavioral: Negative for decreased concentration and dysphoric mood. The patient is not nervous/anxious.        Objective:   Physical Exam Constitutional:      General: She is not in acute distress.    Appearance: Normal appearance. She is well-developed, normal weight and well-nourished. She is not ill-appearing.  HENT:     Head: Normocephalic and atraumatic.  Eyes:     General: No scleral icterus.    Extraocular Movements: EOM normal.     Conjunctiva/sclera: Conjunctivae normal.     Pupils: Pupils are equal, round, and reactive to light.  Cardiovascular:     Rate and Rhythm: Normal rate and regular rhythm.  Pulmonary:     Effort: Pulmonary effort is normal.     Breath sounds: Normal breath sounds. No wheezing or rales.  Abdominal:     General: Bowel sounds are normal. There is no distension.     Palpations: Abdomen is soft.     Tenderness: There is no abdominal tenderness.  Musculoskeletal:        General: Tenderness present.     Cervical back: Normal range of motion and neck supple. No tenderness.     Lumbar back: Spasms and tenderness present. No edema or bony tenderness. Decreased range of motion. Negative right straight leg raise test and negative left straight leg raise test.     Right lower leg: No edema.     Left lower leg: No edema.     Comments: Nl rom hips Mild troch tenderness bilat  Nl rom=pain on R lat bend   Tender in R lumbar musculature  No piriformis pain  No spinous process tenderness  No neuro changes   Lymphadenopathy:     Cervical: No cervical adenopathy.  Skin:    General: Skin is warm and dry.     Coloration: Skin is not pale.  Findings: No erythema or rash.  Neurological:     Mental Status: She is alert.     Cranial Nerves: No cranial nerve deficit.     Sensory: No sensory deficit.     Motor: No atrophy or abnormal muscle tone.     Coordination: Coordination normal.     Deep Tendon  Reflexes: Strength normal and reflexes are normal and symmetric. Reflexes normal.     Comments: Negative SLR  Psychiatric:        Mood and Affect: Mood and affect and mood normal.           Assessment & Plan:   Problem List Items Addressed This Visit      Other   Low back pain - Primary    Right side this time  Suspect spasm/muscular  Rev last LS film-reassuring No neuro changes  Will re for PT Also given home exercises  Tylenol /ibuprofen prn with heat if needed Discussed sleep position  If no improvement consider films Watch for any neuro changes       Relevant Orders   Ambulatory referral to Physical Therapy

## 2020-05-13 NOTE — Patient Instructions (Addendum)
Tylenol is ok   Ibuprofen ok as needed with food  Flexeril with caution of sedation   I placed a referral for PT - you will get a call   Look at the exercises as well

## 2020-05-18 ENCOUNTER — Telehealth: Payer: Self-pay | Admitting: Family Medicine

## 2020-05-18 NOTE — Telephone Encounter (Signed)
Patient called in stating she heard from her insurance company that they do not cover [facility], even though it was authorized. Patient is wanting to use [facility] instead as [employer] said they cover. Please advise and resend referral to them.

## 2020-05-18 NOTE — Telephone Encounter (Signed)
Referral re sent to [facility] and their office will call patient to schedule.

## 2020-07-05 ENCOUNTER — Telehealth: Payer: Self-pay

## 2020-07-05 NOTE — Telephone Encounter (Signed)
Please put it in a 12:30 appt if possible this week.  Thanks

## 2020-07-05 NOTE — Telephone Encounter (Signed)
Pt called to report Right ear soreness, itchy x 2 days...Marland Kitchen pt does use ear plugs when she goes swimming, not sure if it is related.... She describes the pain as achy/throbbing---denies URI Sx some itchy eyes... denies fever or total loss of hearing.... Pt has tried OTC Tylenol with minimal relief   You do not have any openings until next week and other providers no availability until the end of week

## 2020-07-05 NOTE — Telephone Encounter (Signed)
appt scheduled tomorrow at 12:30

## 2020-07-06 ENCOUNTER — Encounter: Payer: Self-pay | Admitting: Family Medicine

## 2020-07-06 ENCOUNTER — Other Ambulatory Visit: Payer: Self-pay

## 2020-07-06 ENCOUNTER — Ambulatory Visit (INDEPENDENT_AMBULATORY_CARE_PROVIDER_SITE_OTHER): Payer: Medicare Other | Admitting: Family Medicine

## 2020-07-06 VITALS — BP 138/86 | HR 59 | Temp 96.9°F | Ht 64.25 in | Wt 154.4 lb

## 2020-07-06 DIAGNOSIS — H6090 Unspecified otitis externa, unspecified ear: Secondary | ICD-10-CM | POA: Insufficient documentation

## 2020-07-06 DIAGNOSIS — J301 Allergic rhinitis due to pollen: Secondary | ICD-10-CM

## 2020-07-06 DIAGNOSIS — H60332 Swimmer's ear, left ear: Secondary | ICD-10-CM

## 2020-07-06 MED ORDER — NEOMYCIN-POLYMYXIN-HC 3.5-10000-1 OT SOLN: 3.0000 [drp] | Freq: Four times a day (QID) | OTIC | 0 refills | Status: DC

## 2020-07-06 NOTE — Patient Instructions (Addendum)
Get back on flonase daily through the allergy season   Use the cortisporin ear drops 3-4 drops in left ear four times daily  If any problems or not improving later in the week let us know   No ear plugs or swimming until ear is better

## 2020-07-06 NOTE — Assessment & Plan Note (Signed)
Urged to continue xyzal Add back flonase  Avoid allergens when possible

## 2020-07-06 NOTE — Assessment & Plan Note (Signed)
L ear in a swimmer who wears ear plugs Px cortisporin otic solution  Adv to avoid swimming and avoid putting plugs in until resolved Update if not starting to improve in a week or if worsening   Also if hearing loss   Meds ordered this encounter  Medications  . neomycin-polymyxin-hydrocortisone (CORTISPORIN) OTIC solution    Sig: Place 3 drops into the left ear 4 (four) times daily.    Dispense:  10 mL    Refill:  0

## 2020-07-06 NOTE — Progress Notes (Signed)
Subjective:    Patient ID: Susan Woods, female    DOB: [birth year], 75 y.o.   MRN: [id number]  This visit occurred during the SARS-CoV-2 public health emergency.  Safety protocols were in place, including screening questions prior to the visit, additional usage of staff PPE, and extensive cleaning of exam room while observing appropriate contact time as indicated for disinfecting solutions.    HPI Pt presents for ear issues /watery eyes  Wt Readings from Last 3 Encounters:  07/06/20 154 lb 6 oz (70 kg)  05/13/20 157 lb 3 oz (71.3 kg)  01/22/20 152 lb 8 oz (69.2 kg)   26.29 kg/m  Pt called yesterday with c/o R ear sore and itchy for 4 d  Uses [employer] plus for swimming -noticed a bit of soreness putting those in  Sore to the touch  No discharge She put in some ear ache drops otc  Hearing is ok   occ the pain radiates down and behind ear   Allergic rhinitis-- bad time of year  Eyes water  xyzal flonase -has not kept up with   Patient Active Problem List   Diagnosis Date Noted  . Otitis externa 07/06/2020  . Bilateral groin pain 06/16/2019  . Rectal bleeding 04/21/2019  . Muscle cramps 04/15/2018  . Internal hemorrhoids 04/27/2016  . Hypothyroid 01/13/2016  . 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 Colon cancer screening 12/22/2014  . Joint pain 08/26/2014  . Low back pain 05/18/2014  . Hypertensive retinopathy of both eyes, grade 1 12/16/2013  . Encounter for Medicare annual wellness exam 12/31/2012  . Routine general medical examination at a health care facility 02/20/2011  . MENINGIOMA 11/17/2009  . Osteoporosis 03/19/2007  . HYPOGLYCEMIA 06/12/2006  . Essential hypertension 06/12/2006  . Allergic rhinitis 06/12/2006  . GERD 06/12/2006   Past Medical History:  Diagnosis Date  . Anemia   . Arthritis    in hips  . GERD (gastroesophageal reflux disease)   . History of hypothyroidism    no current meds  . HTN (hypertension)    echo (8/11): EF 55%, mild MR, mild  TR  . Nasal pruritis    full body w/o rash   . Neuromuscular disorder ([place])    cramps occ after working out  . Nonallergic rhinitis   . Osteoporosis   . Psoriasis    mild intermittent   . Tingling    in hands of ? etiol   Past Surgical History:  Procedure Laterality Date  . 2D echo  8/11   nml   . ABDOMINAL HYSTERECTOMY  1975   with partial 1 ovary removed  . ABDOMINAL SURGERY  1976   adhesions  . CESAREAN SECTION  1971  . COLONOSCOPY  2006  . dexa - osteopenia  2005   dexa 2009 - osteoporosis/fairly stable   . hosp TIA  10/2009   no problems since, told in hospital not tia  . NASAL SINUS SURGERY  2002  . OOPHORECTOMY    . ORIF FINGER / THUMB FRACTURE Right 02/2005  . ovary removed  1976  . removal of internal stitch that did not disolve  1977  . TRANSANAL HEMORRHOIDAL DEARTERIALIZATION N/A 08/22/2019   Procedure: TRANSANAL HEMORRHOIDAL DEARTERIALIZATION;  Surgeon: Leighton Ruff, MD;  Location: [facility];  Service: General;  Laterality: N/A;  . 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 VESICOVAGINAL FISTULA CLOSURE W/ TAH  1975   Social History   Tobacco Use  . Smoking status:  Former Smoker    Years: 5.00    Types: Cigarettes    Quit date: 04/08/1987    Years since quitting: 33.2  . Smokeless tobacco: Never Used  . Tobacco comment: 1-2 cig per day  Vaping Use  . Vaping Use: Never used  Substance Use Topics  . Alcohol use: Yes    Alcohol/week: 0.0 standard drinks    Comment: rarely  . Drug use: No   Family History  Problem Relation Age of Onset  . Cancer Father        throat CA  . Diabetes Father   . Heart disease Mother        MI late 27s - CHF   . Hypertension Mother   . Stroke Brother   . Diabetes Brother   . Heart disease Sister        CHF  . Diabetes Sister   . Hypertension Brother   . Diabetes Brother    Allergies  Allergen Reactions  . Amlodipine Swelling    Ankle swelling  . Azithromycin     Heart palpitations on z  pack  . Levaquin [Levofloxacin In D5w] Other (See Comments)    Muscle pain   . Penicillins     REACTION: urticaria (hives), knots in abdomen easy bruising  . Shrimp [Shellfish Allergy]     Throat tries to close  . Sulfonamide Derivatives     REACTION: tingling around mouth and hands felt tight  . Povidone-Iodine Hives and Itching   Current Outpatient Medications on File Prior to Visit  Medication Sig Dispense Refill  . amLODipine (NORVASC) 5 MG tablet Take 1 tablet (5 mg total) by mouth daily. 90 tablet 3  . Cholecalciferol (VITAMIN D3) 1000 UNITS CAPS Take 1 capsule by mouth daily.    . cyclobenzaprine (FLEXERIL) 10 MG tablet TAKE 1/2-1 TABLET BY MOUTH 3 TIMES DAILY AS NEEDED FOR MUSCLE SPASMS (WATCH FOR SEDATION) 30 tablet 3  . Ferrous Sulfate 250 MG CPCR Take 1 capsule by mouth daily.    . fish oil-omega-3 fatty acids 1000 MG capsule Take 2 g by mouth daily. Alternate with flax oil  500 mg in am    . fluticasone (FLONASE) 50 MCG/ACT nasal spray Place 2 sprays into both nostrils daily. 48 g 3  . hydrochlorothiazide (HYDRODIURIL) 25 MG tablet Take 1 tablet (25 mg total) by mouth daily. 90 tablet 3  . levocetirizine (XYZAL) 5 MG tablet Take 1 tablet (5 mg total) by mouth daily as needed. 90 tablet 3  . losartan (COZAAR) 100 MG tablet Take 1 tablet (100 mg total) by mouth daily. 90 tablet 3  . metoprolol succinate (TOPROL-XL) 100 MG 24 hr tablet Take with or immediately following a meal. 90 tablet 3  . Multiple Vitamin (MULTIVITAMIN) tablet Take 1 tablet by mouth daily.    . NON FORMULARY similisan dry eye drops daily in am     No current facility-administered medications on file prior to visit.    Review of Systems  Constitutional: Negative for activity change, appetite change, fatigue, fever and unexpected weight change.  HENT: Positive for ear pain and rhinorrhea. Negative for congestion, ear discharge, facial swelling, hearing loss, sinus pressure, sinus pain, sore throat and  tinnitus.   Eyes: Negative for pain, redness and visual disturbance.  Respiratory: Negative for cough, shortness of breath and wheezing.   Cardiovascular: Negative for chest pain and palpitations.  Gastrointestinal: Negative for abdominal pain, blood in stool, constipation and diarrhea.  Endocrine: Negative for polydipsia  and polyuria.  Genitourinary: Negative for dysuria, frequency and urgency.  Musculoskeletal: Negative for arthralgias, back pain and myalgias.  Skin: Negative for pallor and rash.  Allergic/Immunologic: Negative for environmental allergies.  Neurological: Negative for dizziness, syncope and headaches.  Hematological: Negative for adenopathy. Does not bruise/bleed easily.  Psychiatric/Behavioral: Negative for decreased concentration and dysphoric mood. The patient is not nervous/anxious.        Objective:   Physical Exam Constitutional:      General: She is not in acute distress.    Appearance: Normal appearance. She is not ill-appearing.  HENT:     Head: Normocephalic and atraumatic.     Right Ear: Tympanic membrane, ear canal and external ear normal. There is no impacted cerumen.     Left Ear: Tympanic membrane normal. There is no impacted cerumen.     Ears:     Comments: L ear canal is mildly swollen with some pale discharge Some skin scale -pink areas  Able to visualize TM    Nose: Congestion present.     Mouth/Throat:     Mouth: Mucous membranes are moist.     Pharynx: Oropharynx is clear. No posterior oropharyngeal erythema.  Eyes:     General: No scleral icterus.       Right eye: No discharge.        Left eye: No discharge.     Pupils: Pupils are equal, round, and reactive to light.  Cardiovascular:     Rate and Rhythm: Normal rate and regular rhythm.     Heart sounds: Normal heart sounds.  Pulmonary:     Effort: Pulmonary effort is normal. No respiratory distress.     Breath sounds: Normal breath sounds. No wheezing.  Musculoskeletal:     Cervical  back: Normal range of motion and neck supple.  Lymphadenopathy:     Cervical: No cervical adenopathy.  Neurological:     Mental Status: She is alert.     Cranial Nerves: No cranial nerve deficit.  Psychiatric:        Mood and Affect: Mood normal.           Assessment & Plan:   Problem List Items Addressed This Visit      Respiratory   Allergic rhinitis - Primary    Urged to continue xyzal Add back flonase  Avoid allergens when possible          Nervous and Auditory   Otitis externa    L ear in a swimmer who wears ear plugs Px cortisporin otic solution  Adv to avoid swimming and avoid putting plugs in until resolved Update if not starting to improve in a week or if worsening   Also if hearing loss   Meds ordered this encounter  Medications  . neomycin-polymyxin-hydrocortisone (CORTISPORIN) OTIC solution    Sig: Place 3 drops into the left ear 4 (four) times daily.    Dispense:  10 mL    Refill:  0

## 2020-07-07 ENCOUNTER — Telehealth: Payer: Self-pay | Admitting: *Deleted

## 2020-07-07 MED ORDER — CIPROFLOXACIN-DEXAMETHASONE 0.3-0.1 % OT SUSP: 4.0000 [drp] | Freq: Two times a day (BID) | OTIC | 0 refills | Status: DC

## 2020-07-07 NOTE — Telephone Encounter (Signed)
I sent ciprodex It should work well  I noted rxn to levaquin in chart (muscle pain) but since this is topical I feel comfortable she won't have a problem  If any side effects let me know

## 2020-07-07 NOTE — Telephone Encounter (Signed)
Received fax from [facility] asking for an alt med for the cortisporin otic solution, it's to expensive for pt  Called pharmacy and pharmacist said they can't tell what med will be cheaper for pt until they receive a new Rx and "run it through her insurance" she did say the only med they have like the one PCP sent in is the neomycin-polymyxin-hydrocortisone suspension (instead of solution), other ear drops they have are ofloxacin and ciprodex.

## 2020-07-07 NOTE — Telephone Encounter (Signed)
Pt notified new Rx sent to pharmacy and advise of PCP's comments

## 2020-08-20 ENCOUNTER — Ambulatory Visit: Payer: Medicare Other | Admitting: Family Medicine

## 2020-08-24 ENCOUNTER — Ambulatory Visit: Payer: Medicare Other | Admitting: Family Medicine

## 2020-08-26 DIAGNOSIS — M1712 Unilateral primary osteoarthritis, left knee: Secondary | ICD-10-CM | POA: Diagnosis not present

## 2020-09-21 DIAGNOSIS — H33022 Retinal detachment with multiple breaks, left eye: Secondary | ICD-10-CM | POA: Diagnosis not present

## 2020-09-21 DIAGNOSIS — H43813 Vitreous degeneration, bilateral: Secondary | ICD-10-CM | POA: Diagnosis not present

## 2020-09-21 DIAGNOSIS — H35372 Puckering of macula, left eye: Secondary | ICD-10-CM | POA: Diagnosis not present

## 2020-09-21 DIAGNOSIS — H33193 Other retinoschisis and retinal cysts, bilateral: Secondary | ICD-10-CM | POA: Diagnosis not present

## 2020-10-01 DIAGNOSIS — H25013 Cortical age-related cataract, bilateral: Secondary | ICD-10-CM | POA: Diagnosis not present

## 2020-10-01 DIAGNOSIS — H18413 Arcus senilis, bilateral: Secondary | ICD-10-CM | POA: Diagnosis not present

## 2020-10-01 DIAGNOSIS — H2513 Age-related nuclear cataract, bilateral: Secondary | ICD-10-CM | POA: Diagnosis not present

## 2020-10-01 DIAGNOSIS — H2512 Age-related nuclear cataract, left eye: Secondary | ICD-10-CM | POA: Diagnosis not present

## 2020-10-01 DIAGNOSIS — H25043 Posterior subcapsular polar age-related cataract, bilateral: Secondary | ICD-10-CM | POA: Diagnosis not present

## 2020-10-25 DIAGNOSIS — H33022 Retinal detachment with multiple breaks, left eye: Secondary | ICD-10-CM | POA: Diagnosis not present

## 2020-10-25 DIAGNOSIS — H2512 Age-related nuclear cataract, left eye: Secondary | ICD-10-CM | POA: Diagnosis not present

## 2020-10-25 HISTORY — PX: RETINAL DETACHMENT SURGERY: SHX105

## 2020-10-26 DIAGNOSIS — H33022 Retinal detachment with multiple breaks, left eye: Secondary | ICD-10-CM | POA: Diagnosis not present

## 2020-11-02 DIAGNOSIS — H31092 Other chorioretinal scars, left eye: Secondary | ICD-10-CM | POA: Diagnosis not present

## 2020-11-24 DIAGNOSIS — H33022 Retinal detachment with multiple breaks, left eye: Secondary | ICD-10-CM | POA: Diagnosis not present

## 2020-11-24 DIAGNOSIS — H43811 Vitreous degeneration, right eye: Secondary | ICD-10-CM | POA: Diagnosis not present

## 2020-11-24 DIAGNOSIS — R6889 Other general symptoms and signs: Secondary | ICD-10-CM | POA: Diagnosis not present

## 2020-11-25 DIAGNOSIS — H3522 Other non-diabetic proliferative retinopathy, left eye: Secondary | ICD-10-CM | POA: Diagnosis not present

## 2020-11-25 DIAGNOSIS — H3342 Traction detachment of retina, left eye: Secondary | ICD-10-CM | POA: Diagnosis not present

## 2020-11-25 DIAGNOSIS — H33022 Retinal detachment with multiple breaks, left eye: Secondary | ICD-10-CM | POA: Diagnosis not present

## 2020-11-26 DIAGNOSIS — H33022 Retinal detachment with multiple breaks, left eye: Secondary | ICD-10-CM | POA: Diagnosis not present

## 2020-12-22 DIAGNOSIS — H33022 Retinal detachment with multiple breaks, left eye: Secondary | ICD-10-CM | POA: Diagnosis not present

## 2021-01-14 ENCOUNTER — Other Ambulatory Visit: Payer: Self-pay

## 2021-01-14 ENCOUNTER — Ambulatory Visit (INDEPENDENT_AMBULATORY_CARE_PROVIDER_SITE_OTHER): Payer: Medicare Other

## 2021-01-14 DIAGNOSIS — Z23 Encounter for immunization: Secondary | ICD-10-CM | POA: Diagnosis not present

## 2021-01-23 ENCOUNTER — Telehealth: Payer: Self-pay | Admitting: Family Medicine

## 2021-01-23 DIAGNOSIS — M810 Age-related osteoporosis without current pathological fracture: Secondary | ICD-10-CM

## 2021-01-23 DIAGNOSIS — E039 Hypothyroidism, unspecified: Secondary | ICD-10-CM

## 2021-01-23 DIAGNOSIS — I10 Essential (primary) hypertension: Secondary | ICD-10-CM

## 2021-01-23 NOTE — Progress Notes (Signed)
Subjective:   Susan Woods is a 75 y.o. female who presents for Medicare Annual (Subsequent) preventive examination.  I connected with Chanese Brodnax today by telephone and verified that I am speaking with the correct person using two identifiers. Location patient: home Location provider: work Persons participating in the virtual visit: patient, Marine scientist.    I discussed the limitations, risks, security and privacy concerns of performing an evaluation and management service by telephone and the availability of in person appointments. I also discussed with the patient that there may be a patient responsible charge related to this service. The patient expressed understanding and verbally consented to this telephonic visit.    Interactive audio and video telecommunications were attempted between this provider and patient, however failed, due to patient having technical difficulties OR patient did not have access to video capability.  We continued and completed visit with audio only.  Some vital signs may be absent or patient reported.   Time Spent with patient on telephone encounter: 30 minutes;  Review of Systems     Cardiac Risk Factors include: hypertension     Objective:    Today's Vitals   01/24/21 0944  Weight: 152 lb (68.9 kg)  Height: 5\' 4"  (1.626 m)   Body mass index is 26.09 kg/m.  Advanced Directives 01/24/2021 08/22/2019 10/28/2016 12/23/2015 05/02/2014  Does Patient Have a Medical Advance Directive? Yes Yes Yes Yes No  Type of Paramedic of [place];Living will [place];Living will - [place];Living will -  Does patient want to make changes to medical advance directive? Yes (MAU/Ambulatory/Procedural Areas - Information given) No - Patient declined - No - Patient declined -  Copy of [place] in Chart? Yes - validated most recent copy scanned in chart (See row information) No -  copy requested - No - copy requested -  Would patient like information on creating a medical advance directive? - - - - No - patient declined information    Current Medications (verified) Outpatient Encounter Medications as of 01/24/2021  Medication Sig   amLODipine (NORVASC) 5 MG tablet Take 1 tablet (5 mg total) by mouth daily.   Cholecalciferol (VITAMIN D3) 1000 UNITS CAPS Take 1 capsule by mouth daily.   ciprofloxacin-dexamethasone (CIPRODEX) OTIC suspension Place 4 drops into the left ear 2 (two) times daily.   cyclobenzaprine (FLEXERIL) 10 MG tablet TAKE 1/2-1 TABLET BY MOUTH 3 TIMES DAILY AS NEEDED FOR MUSCLE SPASMS (WATCH FOR SEDATION)   Ferrous Sulfate 250 MG CPCR Take 1 capsule by mouth daily.   fish oil-omega-3 fatty acids 1000 MG capsule Take 2 g by mouth daily. Alternate with flax oil  500 mg in am   fluticasone (FLONASE) 50 MCG/ACT nasal spray Place 2 sprays into both nostrils daily.   hydrochlorothiazide (HYDRODIURIL) 25 MG tablet Take 1 tablet (25 mg total) by mouth daily.   levocetirizine (XYZAL) 5 MG tablet Take 1 tablet (5 mg total) by mouth daily as needed.   losartan (COZAAR) 100 MG tablet Take 1 tablet (100 mg total) by mouth daily.   metoprolol succinate (TOPROL-XL) 100 MG 24 hr tablet Take with or immediately following a meal.   Multiple Vitamin (MULTIVITAMIN) tablet Take 1 tablet by mouth daily.   NON FORMULARY similisan dry eye drops daily in am   No facility-administered encounter medications on file as of 01/24/2021.    Allergies (verified) Amlodipine, Azithromycin, Levaquin [levofloxacin in d5w], Penicillins, Shrimp [shellfish allergy], Sulfonamide derivatives, and  Povidone-iodine   History: Past Medical History:  Diagnosis Date   Anemia    Arthritis    in hips   GERD (gastroesophageal reflux disease)    History of hypothyroidism    no current meds   HTN (hypertension)    echo (8/11): EF 55%, mild MR, mild TR   Nasal pruritis    full body w/o rash     Neuromuscular disorder ([place])    cramps occ after working out   Nonallergic rhinitis    Osteoporosis    Psoriasis    mild intermittent    Tingling    in hands of ? etiol   Past Surgical History:  Procedure Laterality Date   2D echo  10/2009   [place]   with partial 1 ovary removed   ABDOMINAL SURGERY  1976   adhesions   [place]   COLONOSCOPY  2006   dexa - osteopenia  2005   dexa 2009 - osteoporosis/fairly stable    hosp TIA  10/2009   no problems since, told in hospital not [place]  2002   OOPHORECTOMY     ORIF FINGER / THUMB FRACTURE Right 02/2005   ovary removed  1976   removal of internal stitch that did not [place] Left 10/25/2020   11/25/20   TRANSANAL HEMORRHOIDAL DEARTERIALIZATION N/A 08/22/2019   Procedure: TRANSANAL HEMORRHOIDAL DEARTERIALIZATION;  Surgeon: Leighton Ruff, MD;  Location: [facility];  Service: General;  Laterality: N/A;   urethral stricutre  1989 or 1990   dilated   VESICOVAGINAL FISTULA CLOSURE W/ [place]   Family History  Problem Relation Age of Onset   Cancer Father        throat CA   Diabetes Father    Heart disease Mother        MI late 28s - CHF    Hypertension Mother    Stroke Brother    Diabetes Brother    Heart disease Sister        CHF   Diabetes Sister    Hypertension Brother    Diabetes Brother    Social History   Socioeconomic History   Marital status: Single    Spouse name: Not on file   Number of children: Not on file   Years of education: Not on file   Highest education level: Not on file  Occupational History   Not on file  Tobacco Use   Smoking status: Former    Years: 5.00    Types: Cigarettes    Quit date: 04/08/1987    Years since quitting: 33.8   Smokeless tobacco: Never   Tobacco comments:    1-2 cig per day  Vaping Use   Vaping Use: Never used  Substance and Sexual Activity    Alcohol use: Yes    Comment: occasional   Drug use: No   Sexual activity: Not on file  Other Topics Concern   Not on file  Social History Narrative   Lives in [place]; retired (worked for [employer]); Psychologist, occupational for [employer].       Cell [phone]   Social Determinants of Health   Financial Resource Strain: Low Risk    Difficulty of Paying Living Expenses: Not hard at all  Food Insecurity: No Food Insecurity   Worried About [place] in the Last Year: Never true  Ran Out of Food in the Last Year: Never true  Transportation Needs: No Transportation Needs   Lack of Transportation (Medical): No   Lack of Transportation (Non-Medical): No  Physical Activity: Sufficiently Active   Days of Exercise per Week: 3 days   Minutes of Exercise per Session: 60 min  Stress: No Stress Concern Present   Feeling of Stress : Not at all  Social Connections: Moderately Integrated   Frequency of Communication with Friends and Family: More than three times a week   Frequency of Social Gatherings with Friends and Family: Three times a week   Attends Religious Services: More than 4 times per year   Active Member of Clubs or Organizations: Yes   Attends Music therapist: More than 4 times per year   Marital Status: Never married    Tobacco Counseling Counseling given: Not Answered Tobacco comments: 1-2 cig per day   Clinical Intake:  Pre-visit preparation completed: Yes  Pain : No/denies pain     BMI - recorded: 26.29 Nutritional Status: BMI 25 -29 Overweight Nutritional Risks: None Diabetes: No  How often do you need to have someone help you when you read instructions, pamphlets, or other written materials from your doctor or pharmacy?: 1 - Never  Diabetic?No  Interpreter Needed?: No  Information entered by :: Orrin Brigham LPN   Activities of Daily Living In your present state of health, do you have any difficulty performing the following  activities: 01/24/2021  Hearing? N  Vision? N  Difficulty concentrating or making decisions? N  Walking or climbing stairs? N  Dressing or bathing? N  Doing errands, shopping? N  Preparing Food and eating ? N  Using the Toilet? N  In the past six months, have you accidently leaked urine? N  Do you have problems with loss of bowel control? N  Managing your Medications? N  Managing your Finances? N  Housekeeping or managing your Housekeeping? N  Some recent data might be hidden    Patient Care Team: [place], Wynelle Fanny, MD as PCP - General Thelma Comp, OD as Consulting Physician (Optometry)  Indicate any recent Medical Services you may have received from other than [facility] providers in the past year (date may be approximate).     Assessment:   This is a routine wellness examination for Samiyyah.  Hearing/Vision screen Hearing Screening - Comments:: No issues  Vision Screening - Comments:: Next eye exam 01/25/21 , Dr. Rick Duff , wears glasses  Dietary issues and exercise activities discussed: Current Exercise Habits: Home exercise routine, Type of exercise: walking, Time (Minutes): 60, Frequency (Times/Week): 3, Weekly Exercise (Minutes/Week): 180, Intensity: Moderate, Exercise limited by: cardiac condition(s)   Goals Addressed             This Visit's Progress    Patient Stated       Would like to drink more water and eat healthier       Depression Screen PHQ 2/9 Scores 01/24/2021 01/22/2020 01/16/2019 01/11/2018 01/08/2017 12/23/2015 12/22/2014  PHQ - 2 Score 0 0 0 0 0 0 0    Fall Risk Fall Risk  01/24/2021 01/16/2019 01/11/2018 01/08/2017 12/23/2015  Falls in the past year? 1 0 0 No Yes  Comment - - - - pt tripped while walking up stairs  Number falls in past yr: 0 - - - 1  Injury with Fall? 0 - - - Yes  Risk for fall due to : Impaired vision - - - -  Follow up  Falls prevention discussed Falls evaluation completed - - Falls evaluation completed    FALL RISK  PREVENTION PERTAINING TO THE HOME:  Any stairs in or around the home? Yes  If so, are there any without handrails? No  Home free of loose throw rugs in walkways, pet beds, electrical cords, etc? Yes  Adequate lighting in your home to reduce risk of falls? Yes   ASSISTIVE DEVICES UTILIZED TO PREVENT FALLS:  Life alert? No  Use of a cane, walker or w/c? No  Grab bars in the bathroom? No  Shower chair or bench in shower? No  Elevated toilet seat or a handicapped toilet? Yes   TIMED UP AND GO:  Was the test performed? No . Visit completed over the phone.    Cognitive Function: Normal cognitive status assessed by this Nurse Health Advisor. No abnormalities found.   MMSE - Mini Mental State Exam 12/23/2015  Orientation to time 5  Orientation to Place 5  Registration 3  Attention/ Calculation 0  Recall 3  Language- name 2 objects 0  Language- repeat 1  Language- follow 3 step command 3  Language- read & follow direction 0  Write a sentence 0  Copy design 0  Total score 20        Immunizations Immunization History  Administered Date(s) Administered   Influenza Split 02/20/2011   Influenza Whole 12/20/2005   Influenza, Seasonal, Injecte, Preservative Fre 02/19/2012   Influenza,inj,Quad PF,6+ Mos 12/31/2012, 05/01/2014, 12/22/2014, 11/19/2017, 12/01/2018, 12/17/2019, 01/14/2021   Influenza-Unspecified 11/23/2016   Moderna SARS-COV2 Booster Vaccination 03/23/2020   Moderna Sars-Covid-2 Vaccination 04/15/2019, 05/14/2019   Pneumococcal Conjugate-13 12/22/2014   Pneumococcal Polysaccharide-23 12/31/2012   Td 03/14/1999, 07/03/2006   Tdap 12/16/2015   Zoster, Live 03/24/2011    TDAP status: Up to date  Flu Vaccine status: Up to date  Pneumococcal vaccine status: Up to date  Covid-19 vaccine status: Information provided on how to obtain vaccines.   Qualifies for Shingles Vaccine? Yes   Zostavax completed Yes   Shingrix Completed?: No.    Education has been provided  regarding the importance of this vaccine. Patient has been advised to call insurance company to determine out of pocket expense if they have not yet received this vaccine. Advised may also receive vaccine at local pharmacy or Health Dept. Verbalized acceptance and understanding.  Screening Tests Health Maintenance  Topic Date Due   Zoster Vaccines- Shingrix (1 of 2) Never done   COLON CANCER SCREENING ANNUAL FOBT  04/20/2020   COVID-19 Vaccine (3 - Booster for Moderna series) 05/18/2020   MAMMOGRAM  01/20/2021   Hepatitis C Screening  01/12/2028 (Originally 10/08/1963)   COLONOSCOPY (Pts 45-108yrs Insurance coverage will need to be confirmed)  04/25/2025   TETANUS/TDAP  12/21/2025   Pneumonia Vaccine 48+ Years old  Completed   INFLUENZA VACCINE  Completed   DEXA SCAN  Completed   HPV VACCINES  Aged Out    Health Maintenance  Health Maintenance Due  Topic Date Due   Zoster Vaccines- Shingrix (1 of 2) Never done   COLON CANCER SCREENING ANNUAL FOBT  04/20/2020   COVID-19 Vaccine (3 - Booster for Moderna series) 05/18/2020   MAMMOGRAM  01/20/2021    Colorectal cancer screening: Type of screening: Colonoscopy. Completed 04/26/15. Repeat every 10 years, FOBT due 2022, last completed 04/21/19  Mammogram status:  Bone Density status:  Lung Cancer Screening: (Low Dose CT Chest recommended if Age 56-80 years, 30 pack-year currently smoking OR have quit w/in 15years.) does  not qualify.     Additional Screening:  Hepatitis C Screening: does qualify; Not completed  Vision Screening: Recommended annual ophthalmology exams for early detection of glaucoma and other disorders of the eye. Is the patient up to date with their annual eye exam?  No , eye exam scheduled 01/25/21 Who is the provider or what is the name of the office in which the patient attends annual eye exams? Dr. Rick Duff    Dental Screening: Recommended annual dental exams for proper oral hygiene  Community Resource  Referral / Chronic Care Management: CRR required this visit?  No   CCM required this visit?  No      Plan:     I have personally reviewed and noted the following in the patient's chart:   Medical and social history Use of alcohol, tobacco or illicit drugs  Current medications and supplements including opioid prescriptions.  Functional ability and status Nutritional status Physical activity Advanced directives List of other physicians Hospitalizations, surgeries, and ER visits in previous 12 months Vitals Screenings to include cognitive, depression, and falls Referrals and appointments  In addition, I have reviewed and discussed with patient certain preventive protocols, quality metrics, and best practice recommendations. A written personalized care plan for preventive services as well as general preventive health recommendations were provided to patient.   Due to this being a telephonic visit, the after visit summary with patients personalized plan was offered to patient via mail or my-chart. Patient would like to access on my-chart.   Loma Messing, LPN [id number]   Nurse Health Advisor  Nurse Notes: none

## 2021-01-23 NOTE — Telephone Encounter (Signed)
-----   Message from Ellamae Sia sent at 01/12/2021 12:19 PM EDT ----- Regarding: Lab orders for Monday, 11.14.22 Patient is scheduled for CPX labs, please order future labs, Thanks , Karna Christmas

## 2021-01-24 ENCOUNTER — Ambulatory Visit (INDEPENDENT_AMBULATORY_CARE_PROVIDER_SITE_OTHER): Payer: Medicare Other

## 2021-01-24 ENCOUNTER — Other Ambulatory Visit: Payer: Medicare Other

## 2021-01-24 VITALS — Ht 64.0 in | Wt 152.0 lb

## 2021-01-24 DIAGNOSIS — Z0000 Encounter for general adult medical examination without abnormal findings: Secondary | ICD-10-CM | POA: Diagnosis not present

## 2021-01-24 NOTE — Patient Instructions (Signed)
Susan Woods , Thank you for taking time to complete your Medicare Wellness Visit. I appreciate your ongoing commitment to your health goals. Please review the following plan we discussed and let me know if I can assist you in the future.   Screening recommendations/referrals: Colonoscopy: up to date, completed 04/26/15, due 04/25/25 Mammogram: due last exam 01/21/20 , next appointment schedule 01/25/21 Bone Density: declined today, contact office to schedule if you change your mind Recommended yearly ophthalmology/optometry visit for glaucoma screening and checkup Recommended yearly dental visit for hygiene and checkup  Vaccinations: Influenza vaccine: up to date Pneumococcal vaccine: up to date Tdap vaccine: up to date, completed 12/16/15 due 12/15/25 Shingles vaccine: Discuss with your local pharmacy if you change your mind   Covid-19:Newest booster available at your local pharmacy  Advanced directives: copy on file  Conditions/risks identified: see problem list  Next appointment: Follow up in one year for your annual wellness visit 01/26/22 @ 9:00am, this will be a telephone visit.   Preventive Care 73 Years and Older, Female Preventive care refers to lifestyle choices and visits with your health care provider that can promote health and wellness. What does preventive care include? A yearly physical exam. This is also called an annual well check. Dental exams once or twice a year. Routine eye exams. Ask your health care provider how often you should have your eyes checked. Personal lifestyle choices, including: Daily care of your teeth and gums. Regular physical activity. Eating a healthy diet. Avoiding tobacco and drug use. Limiting alcohol use. Practicing safe sex. Taking low-dose aspirin every day. Taking vitamin and mineral supplements as recommended by your health care provider. What happens during an annual well check? The services and screenings done by your health care  provider during your annual well check will depend on your age, overall health, lifestyle risk factors, and family history of disease. Counseling  Your health care provider may ask you questions about your: Alcohol use. Tobacco use. Drug use. Emotional well-being. Home and relationship well-being. Sexual activity. Eating habits. History of falls. Memory and ability to understand (cognition). Work and work Statistician. Reproductive health. Screening  You may have the following tests or measurements: Height, weight, and BMI. Blood pressure. Lipid and cholesterol levels. These may be checked every 5 years, or more frequently if you are over 56 years old. Skin check. Lung cancer screening. You may have this screening every year starting at age 40 if you have a 30-pack-year history of smoking and currently smoke or have quit within the past 15 years. Fecal occult blood test (FOBT) of the stool. You may have this test every year starting at age 69. Flexible sigmoidoscopy or colonoscopy. You may have a sigmoidoscopy every 5 years or a colonoscopy every 10 years starting at age 77. Hepatitis C blood test. Hepatitis B blood test. Sexually transmitted disease (STD) testing. Diabetes screening. This is done by checking your blood sugar (glucose) after you have not eaten for a while (fasting). You may have this done every 1-3 years. Bone density scan. This is done to screen for osteoporosis. You may have this done starting at age 40. Mammogram. This may be done every 1-2 years. Talk to your health care provider about how often you should have regular mammograms. Talk with your health care provider about your test results, treatment options, and if necessary, the need for more tests. Vaccines  Your health care provider may recommend certain vaccines, such as: Influenza vaccine. This is recommended every year. Tetanus,  diphtheria, and acellular pertussis (Tdap, Td) vaccine. You may need a Td  booster every 10 years. Zoster vaccine. You may need this after age 89. Pneumococcal 13-valent conjugate (PCV13) vaccine. One dose is recommended after age 58. Pneumococcal polysaccharide (PPSV23) vaccine. One dose is recommended after age 75. Talk to your health care provider about which screenings and vaccines you need and how often you need them. This information is not intended to replace advice given to you by your health care provider. Make sure you discuss any questions you have with your health care provider. Document Released: 03/26/2015 Document Revised: 11/17/2015 Document Reviewed: 12/29/2014 Elsevier Interactive Patient Education  2017 [place] Prevention in the Home Falls can cause injuries. They can happen to people of all ages. There are many things you can do to make your home safe and to help prevent falls. What can I do on the outside of my home? Regularly fix the edges of walkways and driveways and fix any cracks. Remove anything that might make you trip as you walk through a door, such as a raised step or threshold. Trim any bushes or trees on the path to your home. Use bright outdoor lighting. Clear any walking paths of anything that might make someone trip, such as rocks or tools. Regularly check to see if handrails are loose or broken. Make sure that both sides of any steps have handrails. Any raised decks and porches should have guardrails on the edges. Have any leaves, snow, or ice cleared regularly. Use sand or salt on walking paths during winter. Clean up any spills in your garage right away. This includes oil or grease spills. What can I do in the bathroom? Use night lights. Install grab bars by the toilet and in the tub and shower. Do not use towel bars as grab bars. Use non-skid mats or decals in the tub or shower. If you need to sit down in the shower, use a plastic, non-slip stool. Keep the floor dry. Clean up any water that spills on the floor  as soon as it happens. Remove soap buildup in the tub or shower regularly. Attach bath mats securely with double-sided non-slip rug tape. Do not have throw rugs and other things on the floor that can make you trip. What can I do in the bedroom? Use night lights. Make sure that you have a light by your bed that is easy to reach. Do not use any sheets or blankets that are too big for your bed. They should not hang down onto the floor. Have a firm chair that has side arms. You can use this for support while you get dressed. Do not have throw rugs and other things on the floor that can make you trip. What can I do in the kitchen? Clean up any spills right away. Avoid walking on wet floors. Keep items that you use a lot in easy-to-reach places. If you need to reach something above you, use a strong step stool that has a grab bar. Keep electrical cords out of the way. Do not use floor polish or wax that makes floors slippery. If you must use wax, use non-skid floor wax. Do not have throw rugs and other things on the floor that can make you trip. What can I do with my stairs? Do not leave any items on the stairs. Make sure that there are handrails on both sides of the stairs and use them. Fix handrails that are broken or loose. Make  sure that handrails are as long as the stairways. Check any carpeting to make sure that it is firmly attached to the stairs. Fix any carpet that is loose or worn. Avoid having throw rugs at the top or bottom of the stairs. If you do have throw rugs, attach them to the floor with carpet tape. Make sure that you have a light switch at the top of the stairs and the bottom of the stairs. If you do not have them, ask someone to add them for you. What else can I do to help prevent falls? Wear shoes that: Do not have high heels. Have rubber bottoms. Are comfortable and fit you well. Are closed at the toe. Do not wear sandals. If you use a stepladder: Make sure that it is  fully opened. Do not climb a closed stepladder. Make sure that both sides of the stepladder are locked into place. Ask someone to hold it for you, if possible. Clearly mark and make sure that you can see: Any grab bars or handrails. First and last steps. Where the edge of each step is. Use tools that help you move around (mobility aids) if they are needed. These include: Canes. Walkers. Scooters. Crutches. Turn on the lights when you go into a dark area. Replace any light bulbs as soon as they burn out. Set up your furniture so you have a clear path. Avoid moving your furniture around. If any of your floors are uneven, fix them. If there are any pets around you, be aware of where they are. Review your medicines with your doctor. Some medicines can make you feel dizzy. This can increase your chance of falling. Ask your doctor what other things that you can do to help prevent falls. This information is not intended to replace advice given to you by your health care provider. Make sure you discuss any questions you have with your health care provider. Document Released: 12/24/2008 Document Revised: 08/05/2015 Document Reviewed: 04/03/2014 Elsevier Interactive Patient Education  2017 [employer].

## 2021-01-25 DIAGNOSIS — Z1231 Encounter for screening mammogram for malignant neoplasm of breast: Secondary | ICD-10-CM | POA: Diagnosis not present

## 2021-01-25 LAB — HM MAMMOGRAPHY

## 2021-01-27 ENCOUNTER — Encounter: Payer: Self-pay | Admitting: Family Medicine

## 2021-01-27 ENCOUNTER — Ambulatory Visit (INDEPENDENT_AMBULATORY_CARE_PROVIDER_SITE_OTHER): Payer: Medicare Other | Admitting: Family Medicine

## 2021-01-27 ENCOUNTER — Other Ambulatory Visit: Payer: Self-pay

## 2021-01-27 VITALS — BP 134/78 | HR 73 | Temp 97.7°F | Ht 64.25 in | Wt 159.0 lb

## 2021-01-27 DIAGNOSIS — I10 Essential (primary) hypertension: Secondary | ICD-10-CM

## 2021-01-27 DIAGNOSIS — H35033 Hypertensive retinopathy, bilateral: Secondary | ICD-10-CM | POA: Diagnosis not present

## 2021-01-27 DIAGNOSIS — E559 Vitamin D deficiency, unspecified: Secondary | ICD-10-CM

## 2021-01-27 DIAGNOSIS — M810 Age-related osteoporosis without current pathological fracture: Secondary | ICD-10-CM

## 2021-01-27 DIAGNOSIS — E039 Hypothyroidism, unspecified: Secondary | ICD-10-CM | POA: Diagnosis not present

## 2021-01-27 DIAGNOSIS — Z0000 Encounter for general adult medical examination without abnormal findings: Secondary | ICD-10-CM | POA: Diagnosis not present

## 2021-01-27 DIAGNOSIS — Z1211 Encounter for screening for malignant neoplasm of colon: Secondary | ICD-10-CM | POA: Diagnosis not present

## 2021-01-27 LAB — CBC WITH DIFFERENTIAL/PLATELET
Basophils Absolute: 0 10*3/uL (ref 0.0–0.1)
Basophils Relative: 0.6 % (ref 0.0–3.0)
Eosinophils Absolute: 0.1 10*3/uL (ref 0.0–0.7)
Eosinophils Relative: 2.9 % (ref 0.0–5.0)
HCT: 34.7 % — ABNORMAL LOW (ref 36.0–46.0)
Hemoglobin: 11.7 g/dL — ABNORMAL LOW (ref 12.0–15.0)
Lymphocytes Relative: 34.2 % (ref 12.0–46.0)
Lymphs Abs: 1.5 10*3/uL (ref 0.7–4.0)
MCHC: 33.6 g/dL (ref 30.0–36.0)
MCV: 86.4 fl (ref 78.0–100.0)
Monocytes Absolute: 0.3 10*3/uL (ref 0.1–1.0)
Monocytes Relative: 7.2 % (ref 3.0–12.0)
Neutro Abs: 2.4 10*3/uL (ref 1.4–7.7)
Neutrophils Relative %: 55.1 % (ref 43.0–77.0)
Platelets: 239 10*3/uL (ref 150.0–400.0)
RBC: 4.02 Mil/uL (ref 3.87–5.11)
RDW: 14.2 % (ref 11.5–15.5)
WBC: 4.4 10*3/uL (ref 4.0–10.5)

## 2021-01-27 LAB — COMPREHENSIVE METABOLIC PANEL
ALT: 12 U/L (ref 0–35)
AST: 21 U/L (ref 0–37)
Albumin: 4.2 g/dL (ref 3.5–5.2)
Alkaline Phosphatase: 69 U/L (ref 39–117)
BUN: 12 mg/dL (ref 6–23)
CO2: 32 mEq/L (ref 19–32)
Calcium: 9.3 mg/dL (ref 8.4–10.5)
Chloride: 102 mEq/L (ref 96–112)
Creatinine, Ser: 0.78 mg/dL (ref 0.40–1.20)
GFR: 75.41 mL/min (ref >60.00)
Glucose, Bld: 94 mg/dL (ref 70–99)
Potassium: 3.7 mEq/L (ref 3.5–5.1)
Sodium: 140 mEq/L (ref 135–145)
Total Bilirubin: 0.8 mg/dL (ref 0.2–1.2)
Total Protein: 6.7 g/dL (ref 6.0–8.3)

## 2021-01-27 LAB — LIPID PANEL
Cholesterol: 156 mg/dL (ref 0–200)
HDL: 51 mg/dL (ref >39.00)
LDL Cholesterol: 92 mg/dL (ref 0–99)
NonHDL: 105.41
Total CHOL/HDL Ratio: 3
Triglycerides: 67 mg/dL (ref 0.0–149.0)
VLDL: 13.4 mg/dL (ref 0.0–40.0)

## 2021-01-27 LAB — TSH: TSH: 0.3 u[IU]/mL — ABNORMAL LOW (ref 0.35–5.50)

## 2021-01-27 LAB — VITAMIN D 25 HYDROXY (VIT D DEFICIENCY, FRACTURES): VITD: 39.26 ng/mL (ref 30.00–100.00)

## 2021-01-27 MED ORDER — HYDROCHLOROTHIAZIDE 25 MG PO TABS: 25.0000 mg | ORAL_TABLET | Freq: Every day | ORAL | 3 refills | Status: DC

## 2021-01-27 MED ORDER — METOPROLOL SUCCINATE ER 100 MG PO TB24: ORAL_TABLET | ORAL | 3 refills | Status: DC

## 2021-01-27 MED ORDER — AMLODIPINE BESYLATE 5 MG PO TABS: 5.0000 mg | ORAL_TABLET | Freq: Every day | ORAL | 3 refills | Status: DC

## 2021-01-27 MED ORDER — LOSARTAN POTASSIUM 100 MG PO TABS: 100.0000 mg | ORAL_TABLET | Freq: Every day | ORAL | 3 refills | Status: DC

## 2021-01-27 NOTE — Assessment & Plan Note (Signed)
Last dexa 2014 Pt declines another dexa or any treatment  inst to alert me if she changes her mind No falls or fractures Taking vit D, level ordered today  Good exercise   Disc need for calcium/ vitamin D/ wt bearing exercise and bone density test every 2 y to monitor Disc safety/ fracture risk in detail

## 2021-01-27 NOTE — Patient Instructions (Addendum)
If you are interested in the new shingles vaccine (Shingrix) - call your local pharmacy to check on coverage and availability  If affordable, get on a wait list at your pharmacy to get the vaccine.  Take care of yourself  Do the ifob stool kit for colon cancer screening   Keep exercising and eating healthy

## 2021-01-27 NOTE — Assessment & Plan Note (Addendum)
Reviewed health habits including diet and exercise and skin cancer prevention Reviewed appropriate screening tests for age  Also reviewed health mt list, fam hx and immunization status , as well as social and family history   See HPI Labs ordered  amw reviewed  Interested in shingrix if covered Colonoscopy is up to date, pt wants to do ifob for screening Mammogram was this week, pending report from [place] 2014, reviewed  Pt declines further eval or tx for OP Good exercise

## 2021-01-27 NOTE — Assessment & Plan Note (Signed)
Nl colonoscopy 2017  Pos ifob in 2019- but pt did have hemorrhoidal bleeding at the time Open to ifob again -this was ordered Declines cologuard or colonsocopy

## 2021-01-27 NOTE — Assessment & Plan Note (Signed)
bp in fair control at this time  BP Readings from Last 1 Encounters:  01/27/21 134/78   No changes needed Most recent labs reviewed  Disc lifstyle change with low sodium diet and exercise  Plan to continue: Amlodipine 5 mg daily  Hctz 25 mg daily  Losartan 100 mg daily  Metoprolol xl 100 mg daily

## 2021-01-27 NOTE — Progress Notes (Signed)
Subjective:    Patient ID: Susan Woods, female    DOB: 1945/06/03, 75 y.o.   MRN: [id number]  This visit occurred during the SARS-CoV-2 public health emergency.  Safety protocols were in place, including screening questions prior to the visit, additional usage of staff PPE, and extensive cleaning of exam room while observing appropriate contact time as indicated for disinfecting solutions.   HPI Here for health maintenance exam and to review chronic medical problems    Wt Readings from Last 3 Encounters:  01/27/21 159 lb (72.1 kg)  01/24/21 152 lb (68.9 kg)  07/06/20 154 lb 6 oz (70 kg)   27.08 kg/m  Had 2 eye surgeries - cataract and retina  Detatched twice  Doing better now  Had to sit at home   Is back to exercise   Had amw on 11/14  No gaps except zoster vaccine    Zoster status -interested in shingrix next year  Covid vaccinated Tdap 2017 Flu shot-this month Pna vaccine -up to date   Colonoscopy 2017  Ifob pos 04/2019 -from hemorrhoids and had surgery  Cologuard was sent to her and she did not do it due to lack of time  No family hx of colon cancer  Would like to do the ifob kit  No GI symptoms    Mammogram 11/21 - had that on Tuesday - [facility] /pending report  Self breast exam -no lumps   Dexa 01/2013 , osteoporosis Declines re check  Declines treatment  Falls -one due to vision (parking lot, did not see step), no injuries  Fractures-none  Supplements -vitamin D , 5000 iu every 4-5 days  Vit D low last time at 26.1 Exercise - started on the elliptical and walking up to 4 miles (flat)  [facility]  Plans to get back to water exercise  Does a lot of stretching   HTN bp is stable today  No cp or palpitations or headaches or edema  No side effects to medicines  BP Readings from Last 3 Encounters:  01/27/21 134/78  07/06/20 138/86  05/13/20 126/74     Pulse Readings from Last 3 Encounters:  01/27/21 73  07/06/20 (!) 59   05/13/20 64    Amlodipine 5 mg daily  Hctz 25 mg daily  Losartan 100 mg daily  Metoprolol xl 100 mg daily   Due for labs  Lab Results  Component Value Date   CREATININE 0.85 01/22/2020   BUN 15 01/22/2020   NA 140 01/22/2020   K 3.7 01/22/2020   CL 102 01/22/2020   CO2 31 01/22/2020   Lab Results  Component Value Date   CHOL 145 01/22/2020   HDL 50.10 01/22/2020   LDLCALC 78 01/22/2020   TRIG 85.0 01/22/2020   CHOLHDL 3 01/22/2020    Patient Active Problem List   Diagnosis Date Noted   Vitamin D deficiency 01/27/2021   Rectal bleeding 04/21/2019   Internal hemorrhoids 04/27/2016   Hypothyroid 01/13/2016   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   Colon cancer screening 12/22/2014   Hypertensive retinopathy of both eyes, grade 1 12/16/2013   Encounter for Medicare annual wellness exam 12/31/2012   Routine general medical examination at a health care facility 02/20/2011   MENINGIOMA 11/17/2009   Osteoporosis 03/19/2007   HYPOGLYCEMIA 06/12/2006   Essential hypertension 06/12/2006   Allergic rhinitis 06/12/2006   GERD 06/12/2006   Past Medical History:  Diagnosis Date   Anemia    Arthritis    in  hips   GERD (gastroesophageal reflux disease)    History of hypothyroidism    no current meds   HTN (hypertension)    echo (8/11): EF 55%, mild MR, mild TR   Nasal pruritis    full body w/o rash    Neuromuscular disorder (HCC)    cramps occ after working out   Nonallergic rhinitis    Osteoporosis    Psoriasis    mild intermittent    Tingling    in hands of ? etiol   Past Surgical History:  Procedure Laterality Date   2D echo  10/2009   [place]   with partial 1 ovary removed   ABDOMINAL SURGERY  1976   adhesions   [place]   COLONOSCOPY  2006   dexa - osteopenia  2005   dexa 2009 - osteoporosis/fairly stable    hosp TIA  10/2009   no problems since, told in hospital not [place]  2002   OOPHORECTOMY      ORIF FINGER / THUMB FRACTURE Right 02/2005   ovary removed  1976   removal of internal stitch that did not [place] Left 10/25/2020   11/25/20   TRANSANAL HEMORRHOIDAL DEARTERIALIZATION N/A 08/22/2019   Procedure: TRANSANAL HEMORRHOIDAL DEARTERIALIZATION;  Surgeon: Leighton Ruff, MD;  Location: [facility];  Service: General;  Laterality: N/A;   urethral stricutre  1989 or 1990   dilated   VESICOVAGINAL FISTULA CLOSURE W/ TAH  1975   Social History   Tobacco Use   Smoking status: Former    Years: 5.00    Types: Cigarettes    Quit date: 04/08/1987    Years since quitting: 33.8   Smokeless tobacco: Never   Tobacco comments:    1-2 cig per day  Vaping Use   Vaping Use: Never used  Substance Use Topics   Alcohol use: Yes    Comment: occasional   Drug use: No   Family History  Problem Relation Age of Onset   Cancer Father        throat CA   Diabetes Father    Heart disease Mother        MI late 10s - CHF    Hypertension Mother    Stroke Brother    Diabetes Brother    Heart disease Sister        CHF   Diabetes Sister    Hypertension Brother    Diabetes Brother    Allergies  Allergen Reactions   Amlodipine Swelling    Ankle swelling   Azithromycin     Heart palpitations on z pack   Levaquin [Levofloxacin In D5w] Other (See Comments)    Muscle pain    Penicillins     REACTION: urticaria (hives), knots in abdomen easy bruising   Shrimp [Shellfish Allergy]     Throat tries to close   Sulfonamide Derivatives     REACTION: tingling around mouth and hands felt tight   Povidone-Iodine Hives and Itching   Current Outpatient Medications on File Prior to Visit  Medication Sig Dispense Refill   Cholecalciferol (VITAMIN D3) 1000 UNITS CAPS Take 1 capsule by mouth daily.     cyclobenzaprine (FLEXERIL) 10 MG tablet TAKE 1/2-1 TABLET BY MOUTH 3 TIMES DAILY AS NEEDED FOR MUSCLE SPASMS (WATCH FOR SEDATION) 30 tablet 3   Ferrous  Sulfate 250 MG CPCR Take 1 capsule  by mouth daily.     fish oil-omega-3 fatty acids 1000 MG capsule Take 2 g by mouth daily. Alternate with flax oil  500 mg in am     fluticasone (FLONASE) 50 MCG/ACT nasal spray Place 2 sprays into both nostrils daily. 48 g 3   levocetirizine (XYZAL) 5 MG tablet Take 1 tablet (5 mg total) by mouth daily as needed. 90 tablet 3   Multiple Vitamin (MULTIVITAMIN) tablet Take 1 tablet by mouth daily.     NON FORMULARY similisan dry eye drops daily in am     No current facility-administered medications on file prior to visit.     Review of Systems  Constitutional:  Negative for activity change, appetite change, fatigue, fever and unexpected weight change.  HENT:  Negative for congestion, ear pain, rhinorrhea, sinus pressure and sore throat.   Eyes:  Negative for pain, redness and visual disturbance.  Respiratory:  Negative for cough, shortness of breath and wheezing.   Cardiovascular:  Negative for chest pain and palpitations.  Gastrointestinal:  Negative for abdominal pain, blood in stool, constipation and diarrhea.  Endocrine: Negative for polydipsia and polyuria.  Genitourinary:  Negative for dysuria, frequency and urgency.  Musculoskeletal:  Negative for arthralgias, back pain and myalgias.  Skin:  Negative for pallor and rash.  Allergic/Immunologic: Negative for environmental allergies.  Neurological:  Negative for dizziness, syncope and headaches.  Hematological:  Negative for adenopathy. Does not bruise/bleed easily.  Psychiatric/Behavioral:  Negative for decreased concentration and dysphoric mood. The patient is not nervous/anxious.       Objective:   Physical Exam Constitutional:      General: She is not in acute distress.    Appearance: Normal appearance. She is well-developed and normal weight. She is not ill-appearing or diaphoretic.  HENT:     Head: Normocephalic and atraumatic.     Right Ear: Tympanic membrane, ear canal and external ear  normal.     Left Ear: Tympanic membrane, ear canal and external ear normal.     Nose: Nose normal. No congestion.     Mouth/Throat:     Mouth: Mucous membranes are moist.     Pharynx: Oropharynx is clear. No posterior oropharyngeal erythema.  Eyes:     General: No scleral icterus.    Extraocular Movements: Extraocular movements intact.     Conjunctiva/sclera: Conjunctivae normal.     Pupils: Pupils are equal, round, and reactive to light.  Neck:     Thyroid: No thyromegaly.     Vascular: No carotid bruit or JVD.  Cardiovascular:     Rate and Rhythm: Normal rate and regular rhythm.     Pulses: Normal pulses.     Heart sounds: Normal heart sounds.    No gallop.  Pulmonary:     Effort: Pulmonary effort is normal. No respiratory distress.     Breath sounds: Normal breath sounds. No wheezing.     Comments: Good air exch Chest:     Chest wall: No tenderness.  Abdominal:     General: Bowel sounds are normal. There is no distension or abdominal bruit.     Palpations: Abdomen is soft. There is no mass.     Tenderness: There is no abdominal tenderness.     Hernia: No hernia is present.  Genitourinary:    Comments: Breast exam: No mass, nodules, thickening, tenderness, bulging, retraction, inflamation, nipple discharge or skin changes noted.  No axillary or clavicular LA.     Musculoskeletal:  General: No tenderness. Normal range of motion.     Cervical back: Normal range of motion and neck supple. No rigidity. No muscular tenderness.     Right lower leg: No edema.     Left lower leg: No edema.  Lymphadenopathy:     Cervical: No cervical adenopathy.  Skin:    General: Skin is warm and dry.     Coloration: Skin is not pale.     Findings: No erythema or rash.  Neurological:     Mental Status: She is alert. Mental status is at baseline.     Cranial Nerves: No cranial nerve deficit.     Motor: No abnormal muscle tone.     Coordination: Coordination normal.     Gait: Gait  normal.     Deep Tendon Reflexes: Reflexes are normal and symmetric. Reflexes normal.  Psychiatric:        Mood and Affect: Mood normal.        Cognition and Memory: Cognition and memory normal.     Comments: Pleasant           Assessment & Plan:   Problem List Items Addressed This Visit       Cardiovascular and Mediastinum   Essential hypertension    bp in fair control at this time  BP Readings from Last 1 Encounters:  01/27/21 134/78  No changes needed Most recent labs reviewed  Disc lifstyle change with low sodium diet and exercise  Plan to continue: Amlodipine 5 mg daily  Hctz 25 mg daily  Losartan 100 mg daily  Metoprolol xl 100 mg daily       Relevant Medications   metoprolol succinate (TOPROL-XL) 100 MG 24 hr tablet   losartan (COZAAR) 100 MG tablet   hydrochlorothiazide (HYDRODIURIL) 25 MG tablet   amLODipine (NORVASC) 5 MG tablet     Endocrine   Hypothyroid    No clinical changes No thyroid supplementation currently  Labs ordered      Relevant Medications   metoprolol succinate (TOPROL-XL) 100 MG 24 hr tablet     Musculoskeletal and Integument   Osteoporosis    Last dexa 2014 Pt declines another dexa or any treatment  inst to alert me if she changes her mind No falls or fractures Taking vit D, level ordered today  Good exercise   Disc need for calcium/ vitamin D/ wt bearing exercise and bone density test every 2 y to monitor Disc safety/ fracture risk in detail          Other   Routine general medical examination at a health care facility - Primary    Reviewed health habits including diet and exercise and skin cancer prevention Reviewed appropriate screening tests for age  Also reviewed health mt list, fam hx and immunization status , as well as social and family history   See HPI Labs ordered  amw reviewed  Interested in shingrix if covered Colonoscopy is up to date, pt wants to do ifob for screening Mammogram was this week, pending  report from [place] 2014, reviewed  Pt declines further eval or tx for OP Good exercise       Hypertensive retinopathy of both eyes, grade 1    Continues close OPH f/u  bp is well controlled       Colon cancer screening    Nl colonoscopy 2017  Pos ifob in 2019- but pt did have hemorrhoidal bleeding at the time Open to ifob again -this was ordered Declines cologuard  or colonsocopy        Relevant Orders   Fecal occult blood, imunochemical   Vitamin D deficiency    Takes 5000 iu every 4 to 5 days Level ordered Discussed importance to bone and overall health

## 2021-01-27 NOTE — Assessment & Plan Note (Signed)
No clinical changes No thyroid supplementation currently  Labs ordered

## 2021-01-27 NOTE — Assessment & Plan Note (Signed)
Continues close OPH f/u  bp is well controlled

## 2021-01-27 NOTE — Assessment & Plan Note (Signed)
Takes 5000 iu every 4 to 5 days Level ordered Discussed importance to bone and overall health

## 2021-01-28 ENCOUNTER — Other Ambulatory Visit (INDEPENDENT_AMBULATORY_CARE_PROVIDER_SITE_OTHER): Payer: Medicare Other

## 2021-01-28 DIAGNOSIS — E039 Hypothyroidism, unspecified: Secondary | ICD-10-CM | POA: Diagnosis not present

## 2021-01-28 LAB — T4, FREE: Free T4: 0.59 ng/dL — ABNORMAL LOW (ref 0.60–1.60)

## 2021-02-01 ENCOUNTER — Other Ambulatory Visit (INDEPENDENT_AMBULATORY_CARE_PROVIDER_SITE_OTHER): Payer: Medicare Other

## 2021-02-01 DIAGNOSIS — Z1211 Encounter for screening for malignant neoplasm of colon: Secondary | ICD-10-CM

## 2021-02-01 LAB — FECAL OCCULT BLOOD, IMMUNOCHEMICAL: Fecal Occult Bld: NEGATIVE

## 2021-02-10 ENCOUNTER — Encounter: Payer: Self-pay | Admitting: Family Medicine

## 2021-02-22 DIAGNOSIS — H43811 Vitreous degeneration, right eye: Secondary | ICD-10-CM | POA: Diagnosis not present

## 2021-02-22 DIAGNOSIS — H26492 Other secondary cataract, left eye: Secondary | ICD-10-CM | POA: Diagnosis not present

## 2021-02-22 DIAGNOSIS — H35372 Puckering of macula, left eye: Secondary | ICD-10-CM | POA: Diagnosis not present

## 2021-02-22 DIAGNOSIS — H3581 Retinal edema: Secondary | ICD-10-CM | POA: Diagnosis not present

## 2021-02-22 DIAGNOSIS — T85398D Other mechanical complication of other ocular prosthetic devices, implants and grafts, subsequent encounter: Secondary | ICD-10-CM | POA: Diagnosis not present

## 2021-02-28 DIAGNOSIS — Z48810 Encounter for surgical aftercare following surgery on the sense organs: Secondary | ICD-10-CM | POA: Diagnosis not present

## 2021-02-28 DIAGNOSIS — H26492 Other secondary cataract, left eye: Secondary | ICD-10-CM | POA: Diagnosis not present

## 2021-02-28 DIAGNOSIS — Z8669 Personal history of other diseases of the nervous system and sense organs: Secondary | ICD-10-CM | POA: Diagnosis not present

## 2021-02-28 DIAGNOSIS — H35372 Puckering of macula, left eye: Secondary | ICD-10-CM | POA: Diagnosis not present

## 2021-02-28 DIAGNOSIS — T85398A Other mechanical complication of other ocular prosthetic devices, implants and grafts, initial encounter: Secondary | ICD-10-CM | POA: Diagnosis not present

## 2021-02-28 DIAGNOSIS — H3581 Retinal edema: Secondary | ICD-10-CM | POA: Diagnosis not present

## 2021-03-01 DIAGNOSIS — H35372 Puckering of macula, left eye: Secondary | ICD-10-CM | POA: Diagnosis not present

## 2021-04-01 DIAGNOSIS — H31092 Other chorioretinal scars, left eye: Secondary | ICD-10-CM | POA: Diagnosis not present

## 2021-04-01 DIAGNOSIS — H3581 Retinal edema: Secondary | ICD-10-CM | POA: Diagnosis not present

## 2021-04-01 DIAGNOSIS — H35372 Puckering of macula, left eye: Secondary | ICD-10-CM | POA: Diagnosis not present

## 2021-04-12 DIAGNOSIS — H2511 Age-related nuclear cataract, right eye: Secondary | ICD-10-CM | POA: Diagnosis not present

## 2021-04-12 DIAGNOSIS — H04123 Dry eye syndrome of bilateral lacrimal glands: Secondary | ICD-10-CM | POA: Diagnosis not present

## 2021-04-12 DIAGNOSIS — H524 Presbyopia: Secondary | ICD-10-CM | POA: Diagnosis not present

## 2021-04-12 DIAGNOSIS — Z9842 Cataract extraction status, left eye: Secondary | ICD-10-CM | POA: Diagnosis not present

## 2021-04-25 ENCOUNTER — Other Ambulatory Visit: Payer: Self-pay

## 2021-04-25 ENCOUNTER — Ambulatory Visit (INDEPENDENT_AMBULATORY_CARE_PROVIDER_SITE_OTHER): Payer: Medicare Other | Admitting: Family Medicine

## 2021-04-25 ENCOUNTER — Encounter: Payer: Self-pay | Admitting: Family Medicine

## 2021-04-25 VITALS — BP 144/82 | HR 78 | Temp 98.6°F | Ht 64.25 in | Wt 160.0 lb

## 2021-04-25 DIAGNOSIS — J0100 Acute maxillary sinusitis, unspecified: Secondary | ICD-10-CM | POA: Diagnosis not present

## 2021-04-25 MED ORDER — DOXYCYCLINE HYCLATE 100 MG PO TABS: 100.0000 mg | ORAL_TABLET | Freq: Two times a day (BID) | ORAL | 0 refills | Status: DC

## 2021-04-25 NOTE — Progress Notes (Signed)
Subjective:    Patient ID: Susan Woods, female    DOB: 01-30-1946, 76 y.o.   MRN: [id number]  This visit occurred during the SARS-CoV-2 public health emergency.  Safety protocols were in place, including screening questions prior to the visit, additional usage of staff PPE, and extensive cleaning of exam room while observing appropriate contact time as indicated for disinfecting solutions.   HPI Pt presents for chills and sore throat  Wt Readings from Last 3 Encounters:  04/25/21 160 lb (72.6 kg)  01/27/21 159 lb (72.1 kg)  01/24/21 152 lb (68.9 kg)   27.25 kg/m  Started early Thursday  Runny nose  Pnd  Sore throat (scratchy but not severe at all) Chills  Had some body aches one night   Nasal mucous was deep yellow but now more clear than it was   Little headache off and on Now sinus pain and pressure   Coughing started yesterday-mild on and off  Just a little production - mucous is clear  No wheeze No SOB   Temperature never went up-no fever    Had covid test on 2/10 that was negative  Otc  [place] hbp- multi symptom (thinks it has tylenol in it)  Flonase daily  Patient Active Problem List   Diagnosis Date Noted   Vitamin D deficiency 01/27/2021   Rectal bleeding 04/21/2019   Internal hemorrhoids 04/27/2016   Hypothyroid 01/13/2016   Acute sinusitis 02/08/2015   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   Colon cancer screening 12/22/2014   Hypertensive retinopathy of both eyes, grade 1 12/16/2013   Encounter for Medicare annual wellness exam 12/31/2012   Routine general medical examination at a health care facility 02/20/2011   MENINGIOMA 11/17/2009   Osteoporosis 03/19/2007   HYPOGLYCEMIA 06/12/2006   Essential hypertension 06/12/2006   Allergic rhinitis 06/12/2006   GERD 06/12/2006   Past Medical History:  Diagnosis Date   Anemia    Arthritis    in hips   GERD (gastroesophageal reflux disease)    History of hypothyroidism    no current meds    HTN (hypertension)    echo (8/11): EF 55%, mild MR, mild TR   Nasal pruritis    full body w/o rash    Neuromuscular disorder (HCC)    cramps occ after working out   Nonallergic rhinitis    Osteoporosis    Psoriasis    mild intermittent    Tingling    in hands of ? etiol   Past Surgical History:  Procedure Laterality Date   2D echo  10/2009   [place]   with partial 1 ovary removed   ABDOMINAL SURGERY  1976   adhesions   CESAREAN SECTION  1971   COLONOSCOPY  2006   dexa - osteopenia  2005   dexa 2009 - osteoporosis/fairly stable    hosp TIA  10/2009   no problems since, told in hospital not [place]  2002   OOPHORECTOMY     ORIF FINGER / THUMB FRACTURE Right 02/2005   ovary removed  1976   removal of internal stitch that did not [place] Left 10/25/2020   11/25/20   TRANSANAL HEMORRHOIDAL DEARTERIALIZATION N/A 08/22/2019   Procedure: TRANSANAL HEMORRHOIDAL DEARTERIALIZATION;  Surgeon: Leighton Ruff, MD;  Location: [facility];  Service: General;  Laterality: N/A;   urethral stricutre  1989 or 1990   dilated   VESICOVAGINAL FISTULA  CLOSURE W/ TAH  1975   Social History   Tobacco Use   Smoking status: Former    Years: 5.00    Types: Cigarettes    Quit date: 04/08/1987    Years since quitting: 34.0   Smokeless tobacco: Never   Tobacco comments:    1-2 cig per day  Vaping Use   Vaping Use: Never used  Substance Use Topics   Alcohol use: Yes    Comment: occasional   Drug use: No   Family History  Problem Relation Age of Onset   Cancer Father        throat CA   Diabetes Father    Heart disease Mother        MI late 54s - CHF    Hypertension Mother    Stroke Brother    Diabetes Brother    Heart disease Sister        CHF   Diabetes Sister    Hypertension Brother    Diabetes Brother    Allergies  Allergen Reactions   Amlodipine Swelling    Ankle swelling    Azithromycin     Heart palpitations on z pack   Levaquin [Levofloxacin In D5w] Other (See Comments)    Muscle pain    Penicillins     REACTION: urticaria (hives), knots in abdomen easy bruising   Shrimp [Shellfish Allergy]     Throat tries to close   Sulfonamide Derivatives     REACTION: tingling around mouth and hands felt tight   Povidone-Iodine Hives and Itching   Current Outpatient Medications on File Prior to Visit  Medication Sig Dispense Refill   amLODipine (NORVASC) 5 MG tablet Take 1 tablet (5 mg total) by mouth daily. 90 tablet 3   Cholecalciferol (VITAMIN D3) 1000 UNITS CAPS Take 1 capsule by mouth daily.     cyclobenzaprine (FLEXERIL) 10 MG tablet TAKE 1/2-1 TABLET BY MOUTH 3 TIMES DAILY AS NEEDED FOR MUSCLE SPASMS (WATCH FOR SEDATION) 30 tablet 3   Ferrous Sulfate 250 MG CPCR Take 1 capsule by mouth daily.     fish oil-omega-3 fatty acids 1000 MG capsule Take 2 g by mouth daily. Alternate with flax oil  500 mg in am     fluticasone (FLONASE) 50 MCG/ACT nasal spray Place 2 sprays into both nostrils daily. 48 g 3   hydrochlorothiazide (HYDRODIURIL) 25 MG tablet Take 1 tablet (25 mg total) by mouth daily. 90 tablet 3   levocetirizine (XYZAL) 5 MG tablet Take 1 tablet (5 mg total) by mouth daily as needed. 90 tablet 3   losartan (COZAAR) 100 MG tablet Take 1 tablet (100 mg total) by mouth daily. 90 tablet 3   metoprolol succinate (TOPROL-XL) 100 MG 24 hr tablet Take with or immediately following a meal. 90 tablet 3   Multiple Vitamin (MULTIVITAMIN) tablet Take 1 tablet by mouth daily.     NON FORMULARY similisan dry eye drops daily in am     No current facility-administered medications on file prior to visit.    Review of Systems  Constitutional:  Positive for appetite change. Negative for fatigue and fever.  HENT:  Positive for congestion, postnasal drip, rhinorrhea, sinus pressure and sore throat. Negative for ear pain and nosebleeds.   Eyes:  Negative for pain, redness and  itching.  Respiratory:  Positive for cough. Negative for shortness of breath and wheezing.   Cardiovascular:  Negative for chest pain.  Gastrointestinal:  Negative for abdominal pain, diarrhea, nausea and vomiting.  Endocrine: Negative for polyuria.  Genitourinary:  Negative for dysuria, frequency and urgency.  Musculoskeletal:  Negative for arthralgias and myalgias.  Allergic/Immunologic: Negative for immunocompromised state.  Neurological:  Positive for headaches. Negative for dizziness, tremors, syncope, weakness and numbness.  Hematological:  Negative for adenopathy. Does not bruise/bleed easily.  Psychiatric/Behavioral:  Negative for dysphoric mood. The patient is not nervous/anxious.       Objective:   Physical Exam Constitutional:      General: She is not in acute distress.    Appearance: Normal appearance. She is well-developed. She is not ill-appearing.  HENT:     Head: Normocephalic and atraumatic.     Comments: Nares are injected and congested  Clear pnd   Bilateral maxillary sinus tenderness     Right Ear: Tympanic membrane and external ear normal.     Left Ear: Tympanic membrane, ear canal and external ear normal.     Nose: Congestion and rhinorrhea present.     Mouth/Throat:     Mouth: Mucous membranes are moist.     Pharynx: No oropharyngeal exudate or posterior oropharyngeal erythema.     Comments: Clear pnd Eyes:     General: No scleral icterus.       Right eye: No discharge.        Left eye: No discharge.     Conjunctiva/sclera: Conjunctivae normal.     Pupils: Pupils are equal, round, and reactive to light.  Cardiovascular:     Rate and Rhythm: Normal rate and regular rhythm.  Pulmonary:     Effort: Pulmonary effort is normal. No respiratory distress.     Breath sounds: Normal breath sounds. No stridor. No wheezing, rhonchi or rales.  Musculoskeletal:     Cervical back: Normal range of motion and neck supple.  Lymphadenopathy:     Cervical: No cervical  adenopathy.  Skin:    General: Skin is warm and dry.     Findings: No rash.  Neurological:     Mental Status: She is alert.     Cranial Nerves: No cranial nerve deficit.  Psychiatric:        Mood and Affect: Mood normal.          Assessment & Plan:   Problem List Items Addressed This Visit       Respiratory   Acute sinusitis - Primary    With viral uri, negative and no fever/ negative covid test  Sinus pain with some colored drainage   Px doxycycline  Fluids/rest sympt care flonase Nasal saline ok to use prn Update if not starting to improve in a week or if worsening       Relevant Medications   doxycycline (VIBRA-TABS) 100 MG tablet

## 2021-04-25 NOTE — Patient Instructions (Addendum)
Use flonase daily   Add saline nasal spray as needed when you feel congested  Treat your symptoms   Take the doxycycline as directed for sinus infection    Update if not starting to improve in a week or if worsening  If trouble breathing, go to the ER

## 2021-04-25 NOTE — Assessment & Plan Note (Signed)
With viral uri, negative and no fever/ negative covid test  Sinus pain with some colored drainage   Px doxycycline  Fluids/rest sympt care flonase Nasal saline ok to use prn Update if not starting to improve in a week or if worsening

## 2021-04-26 ENCOUNTER — Telehealth: Payer: Self-pay | Admitting: Family Medicine

## 2021-04-26 NOTE — Telephone Encounter (Signed)
Mrs. Susan Woods called in and wanted to know if she can take doxycycline (VIBRA-TABS) 100 MG tablet and levocetirizine (XYZAL) 5 MG tablet together.  Please advise

## 2021-04-26 NOTE — Telephone Encounter (Signed)
Yes

## 2021-04-26 NOTE — Telephone Encounter (Signed)
Pt notified of Dr. Tower's comments and verbalized understanding  

## 2021-04-29 ENCOUNTER — Other Ambulatory Visit: Payer: Self-pay | Admitting: *Deleted

## 2021-04-29 MED ORDER — LEVOCETIRIZINE DIHYDROCHLORIDE 5 MG PO TABS: 5.0000 mg | ORAL_TABLET | Freq: Every day | ORAL | 2 refills | Status: DC | PRN

## 2021-04-29 MED ORDER — FLUTICASONE PROPIONATE 50 MCG/ACT NA SUSP: 2.0000 | Freq: Every day | NASAL | 2 refills | Status: DC

## 2021-04-29 NOTE — Telephone Encounter (Signed)
Received a faxed refill request from [facility] for for levocetirizine and f;utocaspme. Spoke to patient by telephone and was advised that she did request these refills. Patient stated that she does not use these medications daily but as needled. Refills sent to pharmacy per patient's request.

## 2021-06-14 DIAGNOSIS — H43811 Vitreous degeneration, right eye: Secondary | ICD-10-CM | POA: Diagnosis not present

## 2021-06-14 DIAGNOSIS — H2511 Age-related nuclear cataract, right eye: Secondary | ICD-10-CM | POA: Diagnosis not present

## 2021-06-14 DIAGNOSIS — H59812 Chorioretinal scars after surgery for detachment, left eye: Secondary | ICD-10-CM | POA: Diagnosis not present

## 2021-06-14 DIAGNOSIS — H43391 Other vitreous opacities, right eye: Secondary | ICD-10-CM | POA: Diagnosis not present

## 2021-07-08 ENCOUNTER — Telehealth: Payer: Self-pay

## 2021-07-08 NOTE — Telephone Encounter (Signed)
[facility] - Client ?TELEPHONE ADVICE RECORD ?AccessNurse? ?Patient ?Name: ?Susan Woods ?[place] ?Gender: Female ?DOB: [birth year] ?Age: 76 Y 25 M 30 D ?Return ?Phone ?Number: ?[id number] ?(Primary), ?[phone] ?(Secondary) ?Address: ?City/ ?State/ ?Zip: ?[place] ?[id number] ?Client [facility] - Client ?Client Site [place] - Day ?Provider Tower, Roque Lias - MD ?Contact Type Call ?Who Is Calling Patient / Member / Family / Caregiver ?Call Type Triage / Clinical ?Relationship To Patient Self ?Return Phone Number [phone] (Secondary) ?Chief Complaint Skin Lesion - Moles/ Lumps/ Growths ?Reason for Call Symptomatic / Request for Health Information ?Initial Comment Caller states has a mole on LT eyelid; should see ?dermatologist, PCP or different type of MD? ?Translation No ?Nurse Assessment ?Nurse: Zorita Pang, RN, Deborah Date/Time (Eastern Time): 07/08/2021 11:54:17 AM ?Confirm and document reason for call. If ?symptomatic, describe symptoms. ?---The caller states that she has a mole on her eyelid. In ?the crease. She thinks it may be changing. ?Does the patient have any new or worsening ?symptoms? ---Yes ?Will a triage be completed? ---Yes ?Related visit to physician within the last 2 weeks? ---No ?Does the PT have any chronic conditions? (i.e. ?diabetes, asthma, this includes High risk factors for ?pregnancy, etc.) ?---Yes ?List chronic conditions. ---hypertension ?Is this a behavioral health or substance abuse call? ---No ?Guidelines ?Guideline Title Affirmed Question Affirmed Notes Nurse Date/Time (Eastern ?Time) ?Skin Lesion - Moles ?or Growths ?Caller is uncertain ?what lesion is ?Womble, RN, ?Neoma Laming ?07/08/2021 11:56:28 ?AM ?Disp. Time (Eastern ?Time) Disposition Final User ?07/08/2021 12:01:24 PM See PCP within 2 Weeks Yes Womble, RN, Neoma Laming ?Caller Disagree/Comply Comply ?Caller Understands Yes ?PLEASE NOTE: All timestamps contained within  this report are represented as [place] Standard Time. ?CONFIDENTIALTY NOTICE: This fax transmission is intended only for the addressee. It contains information that is legally privileged, confidential or ?otherwise protected from use or disclosure. If you are not the intended recipient, you are strictly prohibited from reviewing, disclosing, copying using ?or disseminating any of this information or taking any action in reliance on or regarding this information. If you have received this fax in error, please ?notify us immediately by telephone so that we can arrange for its return to [place]. Phone: [phone], Toll-Free: [phone], Fax: [phone] ?Page: 2 of 2 ?Call Id: [id number] ?PreDisposition Call Doctor ?Care Advice Given Per Guideline ?SEE PCP WITHIN 2 WEEKS: * You need to be seen for this ongoing problem within the next 2 weeks. * You become worse ?Comments ?User: Marquis Buggy, RN Date/Time Eilene Ghazi Time): 07/08/2021 11:57:23 AM ?S/P retinal tear and repairs 3 times. ?User: Marquis Buggy, RN Date/Time Eilene Ghazi Time): 07/08/2021 12:04:04 PM ?The caller states that she already has an appointment to see her PCP on Monday but not sure if she should go to ?see a dermatologist or ophthalmologist and this nurse informed her that she can keep the appointment and let the ?PCP decide if she needs to have it removed or who she would recommend and she verbalized understandin ?

## 2021-07-08 NOTE — Telephone Encounter (Signed)
Aware, will see her then 

## 2021-07-08 NOTE — Telephone Encounter (Signed)
Pt already has appt with Dr Glori Bickers on 07/11/21 at 10 AM. Sending note to Dr Glori Bickers. ?

## 2021-07-11 ENCOUNTER — Encounter: Payer: Self-pay | Admitting: Family Medicine

## 2021-07-11 ENCOUNTER — Ambulatory Visit (INDEPENDENT_AMBULATORY_CARE_PROVIDER_SITE_OTHER): Payer: Medicare Other | Admitting: Family Medicine

## 2021-07-11 DIAGNOSIS — L989 Disorder of the skin and subcutaneous tissue, unspecified: Secondary | ICD-10-CM | POA: Diagnosis not present

## 2021-07-11 NOTE — Progress Notes (Signed)
? ?Subjective:  ? ? Patient ID: Susan Woods, female    DOB: Oct 06, 1945, 76 y.o.   MRN: [id number] ? ?HPI ?Pt presents for mole near eye  ? ?Wt Readings from Last 3 Encounters:  ?07/11/21 154 lb 4.8 oz (70 kg)  ?04/25/21 160 lb (72.6 kg)  ?01/27/21 159 lb (72.1 kg)  ? ?26.28 kg/m? ? ?Hyperpigmented area on L upper eyelid  ?For years -first noticed 2020  ? ?Noticed it after she wiped it hard /seemed irritated-/got red around it  ?Not sore  ?Not tender  ?Not swollen  ? 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 ?Patient Active Problem List  ? Diagnosis Date Noted  ? Skin lesion of face 07/11/2021  ? Vitamin D deficiency 01/27/2021  ? Rectal bleeding 04/21/2019  ? Internal hemorrhoids 04/27/2016  ? Hypothyroid 01/13/2016  ? 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 Colon cancer screening 12/22/2014  ? Hypertensive retinopathy of both eyes, grade 1 12/16/2013  ? Encounter for Medicare annual wellness exam 12/31/2012  ? Routine general medical examination at a health care facility 02/20/2011  ? MENINGIOMA 11/17/2009  ? Osteoporosis 03/19/2007  ? HYPOGLYCEMIA 06/12/2006  ? Essential hypertension 06/12/2006  ? Allergic rhinitis 06/12/2006  ? GERD 06/12/2006  ? ?Past Medical History:  ?Diagnosis Date  ? Anemia   ? Arthritis   ? in hips  ? GERD (gastroesophageal reflux disease)   ? History of hypothyroidism   ? no current meds  ? HTN (hypertension)   ? echo (8/11): EF 55%, mild MR, mild TR  ? Nasal pruritis   ? full body w/o rash   ? Neuromuscular disorder ([place])   ? cramps occ after working out  ? Nonallergic rhinitis   ? Osteoporosis   ? Psoriasis   ? mild intermittent   ? Tingling   ? in hands of ? etiol  ? ?Past Surgical History:  ?Procedure Laterality Date  ? 2D echo  10/2009  ? nml   ? ABDOMINAL HYSTERECTOMY  1975  ? with partial 1 ovary removed  ? ABDOMINAL SURGERY  1976  ? adhesions  ? [place]  ? COLONOSCOPY  2006  ? dexa - osteopenia  2005   ? dexa 2009 - osteoporosis/fairly stable   ? hosp TIA  10/2009  ? no problems since, told in hospital not [place]  ? NASAL SINUS SURGERY  2002  ? OOPHORECTOMY    ? ORIF FINGER / THUMB FRACTURE Right 02/2005  ? ovary removed  1976  ? removal of internal stitch that did not disolve  1977  ? RETINAL DETACHMENT SURGERY Left 10/25/2020  ? 11/25/20  ? TRANSANAL HEMORRHOIDAL DEARTERIALIZATION N/A 08/22/2019  ? Procedure: TRANSANAL HEMORRHOIDAL DEARTERIALIZATION;  Surgeon: Leighton Ruff, MD;  Location: [facility];  Service: General;  Laterality: N/A;  ? urethral stricutre  1989 or 1990  ? dilated  ? VESICOVAGINAL FISTULA CLOSURE W/ TAH  1975  ? ?Social History  ? ?Tobacco Use  ? Smoking status: Former  ?  Years: 5.00  ?  Types: Cigarettes  ?  Quit date: 04/08/1987  ?  Years since quitting: 34.2  ? Smokeless tobacco: Never  ? Tobacco comments:  ?  1-2 cig per day  ?Vaping Use  ? Vaping Use: Never used  ?Substance Use Topics  ? Alcohol use: Yes  ?  Comment: occasional  ? Drug use: No  ? ?Family History  ?Problem Relation Age of Onset  ?  Cancer Father   ?     throat CA  ? Diabetes Father   ? Heart disease Mother   ?     MI late 13s - CHF   ? Hypertension Mother   ? Stroke Brother   ? Diabetes Brother   ? Heart disease Sister   ?     CHF  ? Diabetes Sister   ? Hypertension Brother   ? Diabetes Brother   ? ?Allergies  ?Allergen Reactions  ? Amlodipine Swelling  ?  Ankle swelling  ? Azithromycin   ?  Heart palpitations on z pack  ? Levaquin [Levofloxacin In D5w] Other (See Comments)  ?  Muscle pain   ? Penicillins   ?  REACTION: urticaria (hives), knots in abdomen easy bruising  ? Shrimp [Shellfish Allergy]   ?  Throat tries to close  ? Sulfonamide Derivatives   ?  REACTION: tingling around mouth and hands felt tight  ? Povidone-Iodine Hives and Itching  ? ?Current Outpatient Medications on File Prior to Visit  ?Medication Sig Dispense Refill  ? amLODipine (NORVASC) 5 MG tablet Take 1 tablet (5 mg total) by mouth  daily. 90 tablet 3  ? Cholecalciferol (VITAMIN D3) 1000 UNITS CAPS Take 1 capsule by mouth daily.    ? cyclobenzaprine (FLEXERIL) 10 MG tablet TAKE 1/2-1 TABLET BY MOUTH 3 TIMES DAILY AS NEEDED FOR MUSCLE SPASMS (WATCH FOR SEDATION) 30 tablet 3  ? Ferrous Sulfate 250 MG CPCR Take 1 capsule by mouth daily.    ? fish oil-omega-3 fatty acids 1000 MG capsule Take 2 g by mouth daily. Alternate with flax oil  500 mg in am    ? fluticasone (FLONASE) 50 MCG/ACT nasal spray Place 2 sprays into both nostrils daily. 48 g 2  ? hydrochlorothiazide (HYDRODIURIL) 25 MG tablet Take 1 tablet (25 mg total) by mouth daily. 90 tablet 3  ? levocetirizine (XYZAL) 5 MG tablet Take 1 tablet (5 mg total) by mouth daily as needed. 90 tablet 2  ? losartan (COZAAR) 100 MG tablet Take 1 tablet (100 mg total) by mouth daily. 90 tablet 3  ? metoprolol succinate (TOPROL-XL) 100 MG 24 hr tablet Take with or immediately following a meal. 90 tablet 3  ? Multiple Vitamin (MULTIVITAMIN) tablet Take 1 tablet by mouth daily.    ? NON FORMULARY similisan dry eye drops daily in am    ? ?No current facility-administered medications on file prior to visit.  ?  ?Review of Systems  ?HENT:  Negative for facial swelling and sinus pain.   ?Eyes:  Negative for photophobia, pain, discharge, redness, itching and visual disturbance.  ?Skin:  Negative for rash and wound.  ?     Skin lesion on L eyelid ?Tried to squeeze it  ?Not bothersome   ?Neurological:  Negative for dizziness and headaches.  ? ?   ?Objective:  ? Physical Exam ?Constitutional:   ?   General: She is not in acute distress. ?   Appearance: Normal appearance. She is normal weight.  ?HENT:  ?   Mouth/Throat:  ?   Mouth: Mucous membranes are moist.  ?Eyes:  ?   General: Vision grossly intact. No scleral icterus.    ?   Right eye: No discharge or hordeolum.     ?   Left eye: No discharge or hordeolum.  ?   Extraocular Movements: Extraocular movements intact.  ?   Right eye: Normal extraocular motion.  ?    Left eye: Normal  extraocular motion.  ?   Conjunctiva/sclera: Conjunctivae normal.  ?   Right eye: Right conjunctiva is not injected. No hemorrhage. ?   Left eye: Left conjunctiva is not injected. No hemorrhage. ?   Pupils: Pupils are equal, round, and reactive to light.  ?Cardiovascular:  ?   Rate and Rhythm: Normal rate and regular rhythm.  ?Pulmonary:  ?   Effort: Pulmonary effort is normal. No respiratory distress.  ?Musculoskeletal:  ?   Cervical back: Neck supple.  ?Lymphadenopathy:  ?   Cervical: No cervical adenopathy.  ?Skin: ?   General: Skin is warm and dry.  ?   Coloration: Skin is not pale.  ?   Comments: 2-3 mm fleshy/soft skin lesion on medial R eyelid ?Non tender  ?Some purple skin surrounding it (3 mm) in oval pattern-resembles ecchymosis  ? ?No scale  ?No drainage ?No swelling   ?Neurological:  ?   Mental Status: She is alert.  ?   Cranial Nerves: No cranial nerve deficit.  ?Psychiatric:     ?   Mood and Affect: Mood normal.  ? ? ? ? ? ? ?   ?Assessment & Plan:  ? ?Problem List Items Addressed This Visit   ? ?  ? Musculoskeletal and Integument  ? Skin lesion of face  ?  Soft skin lesion of L eyelid - there for over a year but now some irritation [place] have been traumatized  ?Reassuring exam ?Most likely benign  ?Pt plans to f/u with her eye practitioner (Susan [place])  ?If needed can refer to derm ?Will watch for pain /swelling /enlergement or eye/vision changes  ? ?  ?  ? ? ? ?

## 2021-07-11 NOTE — Assessment & Plan Note (Signed)
Soft skin lesion of L eyelid - there for over a year but now some irritation [place] have been traumatized  ?Reassuring exam ?Most likely benign  ?Pt plans to f/u with her eye practitioner (Dr Megan Salon)  ?If needed can refer to derm ?Will watch for pain /swelling /enlergement or eye/vision changes  ?

## 2021-07-11 NOTE — Patient Instructions (Addendum)
Call your eye doctor office to have them take a look  ? ?If it hurts or gets bigger/swells let us know  ?Or if eye or vision changes  ?Be gentle around it  ? ?If you need to see dermatology we can get that going  ? ? ? ? ? ? ? ?

## 2021-07-18 DIAGNOSIS — H0289 Other specified disorders of eyelid: Secondary | ICD-10-CM | POA: Diagnosis not present

## 2021-07-19 ENCOUNTER — Telehealth: Payer: Self-pay | Admitting: Family Medicine

## 2021-07-19 DIAGNOSIS — L989 Disorder of the skin and subcutaneous tissue, unspecified: Secondary | ICD-10-CM

## 2021-07-19 NOTE — Telephone Encounter (Signed)
I placed the referral  ? ?Call us in 2 wk if she does not hear anything or get mychart message  ?

## 2021-07-19 NOTE — Telephone Encounter (Signed)
Patient saw Dr. Glori Bickers last week for a mole on her eyelid ? ?She has decided she does want the dermatology referral ? ?Patient prefers [place] area, but will travel to [place] ? ? ?

## 2021-10-18 ENCOUNTER — Other Ambulatory Visit: Payer: Self-pay | Admitting: Family Medicine

## 2021-11-08 ENCOUNTER — Other Ambulatory Visit: Payer: Self-pay | Admitting: Family Medicine

## 2021-11-09 NOTE — Telephone Encounter (Signed)
Last filled on 01/22/20 #30 tabs with 3 refills, CPE scheduled 01/30/22

## 2021-11-25 ENCOUNTER — Ambulatory Visit (INDEPENDENT_AMBULATORY_CARE_PROVIDER_SITE_OTHER): Payer: Medicare Other

## 2021-11-25 DIAGNOSIS — Z23 Encounter for immunization: Secondary | ICD-10-CM

## 2022-01-16 ENCOUNTER — Ambulatory Visit: Payer: Medicare Other | Admitting: Dermatology

## 2022-01-30 ENCOUNTER — Encounter: Payer: Self-pay | Admitting: Family Medicine

## 2022-01-30 ENCOUNTER — Encounter: Payer: Medicare Other | Admitting: Family Medicine

## 2022-01-30 ENCOUNTER — Ambulatory Visit (INDEPENDENT_AMBULATORY_CARE_PROVIDER_SITE_OTHER): Payer: Medicare Other | Admitting: Family Medicine

## 2022-01-30 VITALS — BP 118/70 | HR 58 | Temp 97.2°F | Ht 64.25 in | Wt 155.5 lb

## 2022-01-30 DIAGNOSIS — Z0000 Encounter for general adult medical examination without abnormal findings: Secondary | ICD-10-CM | POA: Diagnosis not present

## 2022-01-30 DIAGNOSIS — E559 Vitamin D deficiency, unspecified: Secondary | ICD-10-CM

## 2022-01-30 DIAGNOSIS — M810 Age-related osteoporosis without current pathological fracture: Secondary | ICD-10-CM

## 2022-01-30 DIAGNOSIS — I10 Essential (primary) hypertension: Secondary | ICD-10-CM

## 2022-01-30 DIAGNOSIS — Z1211 Encounter for screening for malignant neoplasm of colon: Secondary | ICD-10-CM

## 2022-01-30 DIAGNOSIS — E039 Hypothyroidism, unspecified: Secondary | ICD-10-CM | POA: Diagnosis not present

## 2022-01-30 LAB — CBC WITH DIFFERENTIAL/PLATELET
Basophils Absolute: 0 10*3/uL (ref 0.0–0.1)
Basophils Relative: 0.4 % (ref 0.0–3.0)
Eosinophils Absolute: 0.1 10*3/uL (ref 0.0–0.7)
Eosinophils Relative: 2 % (ref 0.0–5.0)
HCT: 37.8 % (ref 36.0–46.0)
Hemoglobin: 12.6 g/dL (ref 12.0–15.0)
Lymphocytes Relative: 33.1 % (ref 12.0–46.0)
Lymphs Abs: 2.1 10*3/uL (ref 0.7–4.0)
MCHC: 33.4 g/dL (ref 30.0–36.0)
MCV: 88.8 fl (ref 78.0–100.0)
Monocytes Absolute: 0.4 10*3/uL (ref 0.1–1.0)
Monocytes Relative: 6.2 % (ref 3.0–12.0)
Neutro Abs: 3.7 10*3/uL (ref 1.4–7.7)
Neutrophils Relative %: 58.3 % (ref 43.0–77.0)
Platelets: 246 10*3/uL (ref 150.0–400.0)
RBC: 4.26 Mil/uL (ref 3.87–5.11)
RDW: 14 % (ref 11.5–15.5)
WBC: 6.4 10*3/uL (ref 4.0–10.5)

## 2022-01-30 LAB — LIPID PANEL
Cholesterol: 149 mg/dL (ref 0–200)
HDL: 50.8 mg/dL (ref >39.00)
LDL Cholesterol: 82 mg/dL (ref 0–99)
NonHDL: 98.08
Total CHOL/HDL Ratio: 3
Triglycerides: 81 mg/dL (ref 0.0–149.0)
VLDL: 16.2 mg/dL (ref 0.0–40.0)

## 2022-01-30 LAB — COMPREHENSIVE METABOLIC PANEL
ALT: 8 U/L (ref 0–35)
AST: 16 U/L (ref 0–37)
Albumin: 4.2 g/dL (ref 3.5–5.2)
Alkaline Phosphatase: 62 U/L (ref 39–117)
BUN: 13 mg/dL (ref 6–23)
CO2: 30 mEq/L (ref 19–32)
Calcium: 9.4 mg/dL (ref 8.4–10.5)
Chloride: 104 mEq/L (ref 96–112)
Creatinine, Ser: 0.81 mg/dL (ref 0.40–1.20)
GFR: 71.56 mL/min (ref >60.00)
Glucose, Bld: 105 mg/dL — ABNORMAL HIGH (ref 70–99)
Potassium: 4 mEq/L (ref 3.5–5.1)
Sodium: 142 mEq/L (ref 135–145)
Total Bilirubin: 0.7 mg/dL (ref 0.2–1.2)
Total Protein: 6.9 g/dL (ref 6.0–8.3)

## 2022-01-30 LAB — VITAMIN D 25 HYDROXY (VIT D DEFICIENCY, FRACTURES): VITD: 43.83 ng/mL (ref 30.00–100.00)

## 2022-01-30 LAB — T4, FREE: Free T4: 0.68 ng/dL (ref 0.60–1.60)

## 2022-01-30 LAB — TSH: TSH: 0.16 u[IU]/mL — ABNORMAL LOW (ref 0.35–5.50)

## 2022-01-30 NOTE — Assessment & Plan Note (Signed)
Vitamin D level is therapeutic with current supplementation Disc importance of this to bone and overall health Level of 39.2

## 2022-01-30 NOTE — Progress Notes (Signed)
Subjective:    Patient ID: [facility], female    DOB: 06/21/1945, 76 y.o.   MRN: [id number]  HPI Here for health maintenance exam and to review chronic medical problems    Wt Readings from Last 3 Encounters:  01/30/22 155 lb 8 oz (70.5 kg)  07/11/21 154 lb 4.8 oz (70 kg)  04/25/21 160 lb (72.6 kg)   26.48 kg/m  Doing well  Taking care of herself  Swimming for exercise- excited to do that now  Loves to be around people   Immunization History  Administered Date(s) Administered   Influenza Split 02/20/2011   Influenza Whole 12/20/2005   Influenza, Seasonal, Injecte, Preservative Fre 02/19/2012   Influenza,inj,Quad PF,6+ Mos 12/31/2012, 05/01/2014, 12/22/2014, 11/19/2017, 12/01/2018, 12/17/2019, 01/14/2021, 11/25/2021   Influenza-Unspecified 11/23/2016   Moderna SARS-COV2 Booster Vaccination 03/23/2020   Moderna Sars-Covid-2 Vaccination 04/15/2019, 05/14/2019   Pneumococcal Conjugate-13 12/22/2014   Pneumococcal Polysaccharide-23 12/31/2012   Td 03/14/1999, 07/03/2006   Tdap 12/16/2015   Zoster, Live 03/24/2011   Health Maintenance Due  Topic Date Due   Zoster Vaccines- Shingrix (1 of 2) Never done   COVID-19 Vaccine (3 - Moderna series) 05/18/2020   Medicare Annual Wellness (AWV)  01/24/2022   MAMMOGRAM  01/25/2022    Shingrix: may be interested   M[facility]ogram 01/20[facility] at [facility] - tomorrow is scheduled  Self breast exam : no lumps   Colonoscopy 04/2015  Ifob negative 01/2021  Wants to do that again  Declines colonoscopy     Dexa  2014- osteoporosis  Declines further eval   Falls: none  Fractures: none  Supplements  D level of 39.2  Exercise : good now  Swim, elliptical Uses weights in the pool for strength training     HTN with hypertensive eye dz in past  bp is stable today  No cp or palpitations or headaches or edema  No side effects to medicines  BP Readings from Last 3 Encounters:  01/30/22 118/70  07/11/21 122/78  04/25/21 (!)  144/82    Pulse Readings from Last 3 Encounters:  01/30/22 (!) 58  07/11/21 85  04/25/21 78     Amlodipine 5 mg daily  Hctz 25 mg daily  Losartan 100 mg daily  Metoprolol xl 100 mg daily   Hypothyroidism  Pt has no clinical changes No change in energy level/ hair or skin/ edema and no tremor Lab Results  Component Value Date   TSH 0.30 (L) 01/27/2021     Past endo care  Never did go for follow up   Lab today  Open to going back to endo Dr Honor Junes    Patient Active Problem List   Diagnosis Date Noted   Skin lesion of face 07/11/2021   Vitamin D deficiency 01/27/2021   Rectal bleeding 04/21/2019   Internal hemorrhoids 04/27/2016   Hypothyroid 01/13/2016   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   Colon cancer screening 12/22/2014   Hypertensive retinopathy of both eyes, grade 1 12/16/2013   Encounter for Medicare annual wellness exam 12/31/2012   Routine general medical examination at a health care facility 02/20/2011   MENINGIOMA 11/17/2009   Osteoporosis 03/19/2007   HYPOGLYCEMIA 06/12/2006   Essential hypertension 06/12/2006   Allergic rhinitis 06/12/2006   GERD 06/12/2006   Past Medical History:  Diagnosis Date   Anemia    Arthritis    in hips   GERD (gastroesophageal reflux disease)    History of hypothyroidism    no current meds   HTN (hypertension)  echo (8/11): EF 55%, mild MR, mild TR   Nasal pruritis    full body w/o rash    Neuromuscular disorder (HCC)    cramps occ after working out   Nonallergic rhinitis    Osteoporosis    Psoriasis    mild intermittent    Tingling    in hands of ? etiol   Past Surgical History:  Procedure Laterality Date   2D echo  10/2009   [place]   with partial 1 ovary removed   ABDOMINAL SURGERY  1976   adhesions   [place]   COLONOSCOPY  2006   dexa - osteopenia  2005   dexa 2009 - osteoporosis/fairly stable    hosp TIA  10/2009   no problems since, told in hospital not  [place]  2002   OOPHORECTOMY     ORIF FINGER / THUMB FRACTURE Right 02/2005   ovary removed  1976   removal of internal stitch that did not [place] Left 10/25/2020   11/25/20   TRANSANAL HEMORRHOIDAL DEARTERIALIZATION N/A 08/22/2019   Procedure: TRANSANAL HEMORRHOIDAL DEARTERIALIZATION;  Surgeon: Leighton Ruff, MD;  Location: [facility];  Service: General;  Laterality: N/A;   urethral stricutre  1989 or 1990   dilated   VESICOVAGINAL FISTULA CLOSURE W/ TAH  1975   Social History   Tobacco Use   Smoking status: Former    Years: 5.00    Types: Cigarettes    Quit date: 04/08/1987    Years since quitting: 34.8   Smokeless tobacco: Never   Tobacco comments:    1-2 cig per day  Vaping Use   Vaping Use: Never used  Substance Use Topics   Alcohol use: Yes    Comment: occasional   Drug use: No   Family History  Problem Relation Age of Onset   Cancer Father        throat CA   Diabetes Father    Heart disease Mother        MI late 53s - CHF    Hypertension Mother    Stroke Brother    Diabetes Brother    Heart disease Sister        CHF   Diabetes Sister    Hypertension Brother    Diabetes Brother    Allergies  Allergen Reactions   Amlodipine Swelling    Ankle swelling   Azithromycin     Heart palpitations on z pack   Levaquin [Levofloxacin In D5w] Other (See Comments)    Muscle pain    Penicillins     REACTION: urticaria (hives), knots in abdomen easy bruising   Shrimp [Shellfish Allergy]     Throat tries to close   Sulfonamide Derivatives     REACTION: tingling around mouth and hands felt tight   Povidone-Iodine Hives and Itching   Current Outpatient Medications on File Prior to Visit  Medication Sig Dispense Refill   amLODipine (NORVASC) 5 MG tablet TAKE 1 TABLET BY MOUTH DAILY 80 tablet 3   Cholecalciferol (VITAMIN D3) 1000 UNITS CAPS Take 1 capsule by mouth daily.     cyclobenzaprine  (FLEXERIL) 10 MG tablet TAKE 1/2-1 TABLET BY MOUTH 3 TIMES DAILY AS NEEDED FOR MUSCLE SPASMS (WATCH FOR SEDATION) 30 tablet 3   Ferrous Sulfate 250 MG CPCR Take 1 capsule by mouth daily.     fish oil-omega-3 fatty  acids 1000 MG capsule Take 2 g by mouth daily. Alternate with flax oil  500 mg in am     fluticasone (FLONASE) 50 MCG/ACT nasal spray USE 2 SPRAYS IN BOTH NOSTRILS  DAILY 48 g 2   hydrochlorothiazide (HYDRODIURIL) 25 MG tablet TAKE 1 TABLET BY MOUTH DAILY 80 tablet 3   levocetirizine (XYZAL) 5 MG tablet TAKE 1 TABLET BY MOUTH DAILY AS  NEEDED 90 tablet 2   losartan (COZAAR) 100 MG tablet TAKE 1 TABLET BY MOUTH DAILY 80 tablet 3   magnesium 30 MG tablet Take 30 mg by mouth daily.     metoprolol succinate (TOPROL-XL) 100 MG 24 hr tablet TAKE 1 TABLET BY MOUTH ONCE  DAILY WITH A MEAL OR IMMEDIATELY FOLLOWING A MEAL 80 tablet 3   Multiple Vitamin (MULTIVITAMIN) tablet Take 1 tablet by mouth daily.     NON FORMULARY similisan dry eye drops daily in am     No current facility-administered medications on file prior to visit.      Review of Systems  Constitutional:  Negative for activity change, appetite change, fatigue, fever and unexpected weight change.  HENT:  Negative for congestion, ear pain, rhinorrhea, sinus pressure and sore throat.   Eyes:  Negative for pain, redness and visual disturbance.  Respiratory:  Negative for cough, shortness of breath and wheezing.   Cardiovascular:  Negative for chest pain and palpitations.  Gastrointestinal:  Negative for abdominal pain, blood in stool, constipation and diarrhea.  Endocrine: Negative for polydipsia and polyuria.  Genitourinary:  Negative for dysuria, frequency and urgency.  Musculoskeletal:  Negative for arthralgias, back pain and myalgias.  Skin:  Negative for pallor and rash.  Allergic/Immunologic: Negative for environmental allergies.  Neurological:  Negative for dizziness, syncope and headaches.  Hematological:  Negative for  adenopathy. Does not bruise/bleed easily.  Psychiatric/Behavioral:  Negative for decreased concentration and dysphoric mood. The patient is not nervous/anxious.        Objective:   Physical Exam Constitutional:      General: She is not in acute distress.    Appearance: Normal appearance. She is well-developed and normal weight. She is not ill-appearing or diaphoretic.  HENT:     Head: Normocephalic and atraumatic.     Right Ear: Tympanic membrane, ear canal and external ear normal.     Left Ear: Tympanic membrane, ear canal and external ear normal.     Nose: Nose normal. No congestion.     Mouth/Throat:     Mouth: Mucous membranes are moist.     Pharynx: Oropharynx is clear. No posterior oropharyngeal erythema.  Eyes:     General: No scleral icterus.    Extraocular Movements: Extraocular movements intact.     Conjunctiva/sclera: Conjunctivae normal.     Pupils: Pupils are equal, round, and reactive to light.  Neck:     Thyroid: No thyromegaly.     Vascular: No carotid bruit or JVD.  Cardiovascular:     Rate and Rhythm: Regular rhythm. Bradycardia present.     Pulses: Normal pulses.     Heart sounds: Normal heart sounds.     No gallop.  Pulmonary:     Effort: Pulmonary effort is normal. No respiratory distress.     Breath sounds: Normal breath sounds. No wheezing.     Comments: Good air exch Chest:     Chest wall: No tenderness.  Abdominal:     General: Bowel sounds are normal. There is no distension or abdominal bruit.  Palpations: Abdomen is soft. There is no mass.     Tenderness: There is no abdominal tenderness.     Hernia: No hernia is present.  Genitourinary:    Comments: Breast exam: No mass, nodules, thickening, tenderness, bulging, retraction, inflamation, nipple discharge or skin changes noted.  No axillary or clavicular LA.     Musculoskeletal:        General: No tenderness. Normal range of motion.     Cervical back: Normal range of motion and neck supple.  No rigidity. No muscular tenderness.     Right lower leg: No edema.     Left lower leg: No edema.     Comments: No kyphosis   Lymphadenopathy:     Cervical: No cervical adenopathy.  Skin:    General: Skin is warm and dry.     Coloration: Skin is not pale.     Findings: No erythema or rash.     Comments: Some lentigines   Neurological:     Mental Status: She is alert. Mental status is at baseline.     Cranial Nerves: No cranial nerve deficit.     Motor: No abnormal muscle tone.     Coordination: Coordination normal.     Gait: Gait normal.     Deep Tendon Reflexes: Reflexes are normal and symmetric. Reflexes normal.  Psychiatric:        Mood and Affect: Mood normal.        Cognition and Memory: Cognition and memory normal.           Assessment & Plan:   Problem List Items Addressed This Visit       Cardiovascular and Mediastinum   Essential hypertension    bp in fair control at this time  BP Readings from Last 1 Encounters:  01/30/22 118/70  No changes needed Most recent labs reviewed  Disc lifstyle change with low sodium diet and exercise  Plan to continue Amlodipine 5 mg daily  Hctz 25 mg daily  Losartan 100 mg daily  Metoprolol xl 100 mg daily       Relevant Orders   TSH (Completed)   Lipid panel (Completed)   Comprehensive metabolic panel (Completed)   CBC with Differential/Platelet (Completed)     Endocrine   Hypothyroid    Tsh ordered  Pt declined f/u with endocrinology last time, wants to see how these labs turn out  No symptoms  Disc inc risk of OP with elevated thyroid fxn      Relevant Orders   TSH (Completed)   T4, free (Completed)     Musculoskeletal and Integument   Osteoporosis    Declines dexa  No falls or fx D level is tx   Last tsh was low- pending today's lab      Relevant Orders   VITAMIN D 25 Hydroxy (Vit-D Deficiency, Fractures) (Completed)     Other   Colon cancer screening    Pt chose ifob for screening  Declines  colonoscopy and cologuard  Options discussed       Relevant Orders   Fecal occult blood, imunochemical   Routine general medical examination at a health care facility - Primary    Reviewed health habits including diet and exercise and skin cancer prevention Reviewed appropriate screening tests for age  Also reviewed health mt list, fam hx and immunization status , as well as social and family history   Mammogram scheduled tomorrow at [facility]  Colonoscopy 2017, pt declines , chose ifob kit (declines  cologuard also) pros and cons discussed  Declines dexa  Plans to check on ins coverage for shingrix  No falls or fx  Good exercise       Vitamin D deficiency    Vitamin D level is therapeutic with current supplementation Disc importance of this to bone and overall health Level of 39.2

## 2022-01-30 NOTE — Assessment & Plan Note (Signed)
Pt chose ifob for screening  Declines colonoscopy and cologuard  Options discussed

## 2022-01-30 NOTE — Assessment & Plan Note (Signed)
Reviewed health habits including diet and exercise and skin cancer prevention Reviewed appropriate screening tests for age  Also reviewed health mt list, fam hx and immunization status , as well as social and family history   Mammogram scheduled tomorrow at [facility]  Colonoscopy 2017, pt declines , chose ifob kit (declines cologuard also) pros and cons discussed  Declines dexa  Plans to check on ins coverage for shingrix  No falls or fx  Good exercise

## 2022-01-30 NOTE — Assessment & Plan Note (Signed)
bp in fair control at this time  BP Readings from Last 1 Encounters:  01/30/22 118/70   No changes needed Most recent labs reviewed  Disc lifstyle change with low sodium diet and exercise  Plan to continue Amlodipine 5 mg daily  Hctz 25 mg daily  Losartan 100 mg daily  Metoprolol xl 100 mg daily

## 2022-01-30 NOTE — Assessment & Plan Note (Signed)
Tsh ordered  Pt declined f/u with endocrinology last time, wants to see how these labs turn out  No symptoms  Disc inc risk of OP with elevated thyroid fxn

## 2022-01-30 NOTE — Patient Instructions (Addendum)
If you are interested in the new shingles vaccine (Shingrix) - call your local pharmacy to check on coverage and availability  If affordable, get on a wait list at your pharmacy to get the vaccine.  Labs today  Mammogram tomorrow   Do the stool kit for colon screening   Keep up the great exercise

## 2022-01-30 NOTE — Assessment & Plan Note (Signed)
Declines dexa  No falls or fx D level is tx   Last tsh was low- pending today's lab

## 2022-01-31 ENCOUNTER — Telehealth: Payer: Self-pay | Admitting: Family Medicine

## 2022-01-31 DIAGNOSIS — R7989 Other specified abnormal findings of blood chemistry: Secondary | ICD-10-CM

## 2022-01-31 DIAGNOSIS — Z1231 Encounter for screening mammogram for malignant neoplasm of breast: Secondary | ICD-10-CM | POA: Diagnosis not present

## 2022-01-31 LAB — HM MAMMOGRAPHY

## 2022-01-31 NOTE — Telephone Encounter (Signed)
Pt called to let Dr. Glori Bickers know that its fine to go ahead & put in a referral to endocrinology. Call back # [phone]

## 2022-01-31 NOTE — Telephone Encounter (Signed)
I put the referral in  I recommend she call that clinic (Dr Honor Junes) if she has not heard It will take a while to get in

## 2022-01-31 NOTE — Telephone Encounter (Signed)
Pt notified of Dr. Tower's comments and verbalized understanding  

## 2022-02-01 ENCOUNTER — Other Ambulatory Visit (INDEPENDENT_AMBULATORY_CARE_PROVIDER_SITE_OTHER): Payer: Medicare Other | Admitting: *Deleted

## 2022-02-01 DIAGNOSIS — Z1211 Encounter for screening for malignant neoplasm of colon: Secondary | ICD-10-CM

## 2022-02-01 NOTE — Telephone Encounter (Signed)
Unfortunately, [facility] is NOT accepting new patients.  The last time I spoke with them about referrals was a few weeks ago and they are not accepting any new referrals until next year.   Alternative locations sent to the patient in a list via mychart.

## 2022-02-01 NOTE — Telephone Encounter (Signed)
Please ask her if she has a favorite 2nd choice  It is hard to get in right now

## 2022-02-06 LAB — FECAL OCCULT BLOOD, IMMUNOCHEMICAL: Fecal Occult Bld: NEGATIVE

## 2022-02-14 ENCOUNTER — Encounter: Payer: Self-pay | Admitting: Family Medicine

## 2022-02-27 ENCOUNTER — Ambulatory Visit: Payer: Medicare Other | Admitting: Dermatology

## 2022-02-27 VITALS — BP 150/97 | HR 70

## 2022-02-27 DIAGNOSIS — L821 Other seborrheic keratosis: Secondary | ICD-10-CM

## 2022-02-27 DIAGNOSIS — L578 Other skin changes due to chronic exposure to nonionizing radiation: Secondary | ICD-10-CM

## 2022-02-27 DIAGNOSIS — L820 Inflamed seborrheic keratosis: Secondary | ICD-10-CM

## 2022-02-27 NOTE — Patient Instructions (Signed)
Due to recent changes in healthcare laws, you may see results of your pathology and/or laboratory studies on MyChart before the doctors have had a chance to review them. We understand that in some cases there may be results that are confusing or concerning to you. Please understand that not all results are received at the same time and often the doctors may need to interpret multiple results in order to provide you with the best plan of care or course of treatment. Therefore, we ask that you please give us 2 business days to thoroughly review all your results before contacting the office for clarification. Should we see a critical lab result, you will be contacted sooner.   If You Need Anything After Your Visit  If you have any questions or concerns for your doctor, please call our main line at [phone] and press option 4 to reach your doctor's medical assistant. If no one answers, please leave a voicemail as directed and we will return your call as soon as possible. Messages left after 4 pm will be answered the following business day.   You may also send us a message via MyChart. We typically respond to MyChart messages within 1-2 business days.  For prescription refills, please ask your pharmacy to contact our office. Our fax number is [phone].  If you have an urgent issue when the clinic is closed that cannot wait until the next business day, you can page your doctor at the number below.    Please note that while we do our best to be available for urgent issues outside of office hours, we are not available 24/7.   If you have an urgent issue and are unable to reach us, you may choose to seek medical care at your doctor's office, retail clinic, urgent care center, or emergency room.  If you have a medical emergency, please immediately call 911 or go to the emergency department.  Pager Numbers  - Dr. Kowalski: [phone]  - Dr. Moye: [phone]  - Dr. Stewart:  [phone]  In the event of inclement weather, please call our main line at [phone] for an update on the status of any delays or closures.  Dermatology Medication Tips: Please keep the boxes that topical medications come in in order to help keep track of the instructions about where and how to use these. Pharmacies typically print the medication instructions only on the boxes and not directly on the medication tubes.   If your medication is too expensive, please contact our office at [phone] option 4 or send us a message through MyChart.   We are unable to tell what your co-pay for medications will be in advance as this is different depending on your insurance coverage. However, we may be able to find a substitute medication at lower cost or fill out paperwork to get insurance to cover a needed medication.   If a prior authorization is required to get your medication covered by your insurance company, please allow us 1-2 business days to complete this process.  Drug prices often vary depending on where the prescription is filled and some pharmacies may offer cheaper prices.  The website www.goodrx.com contains coupons for medications through different pharmacies. The prices here do not account for what the cost may be with help from insurance (it may be cheaper with your insurance), but the website can give you the price if you did not use any insurance.  - You can print the associated coupon and take it with   your prescription to the pharmacy.  - You may also stop by our office during regular business hours and pick up a GoodRx coupon card.  - If you need your prescription sent electronically to a different pharmacy, notify our office through [facility] MyChart or by phone at [phone] option 4.     Si Usted Necesita Algo Despus de Su Visita  Tambin puede enviarnos un mensaje a travs de MyChart. Por lo general respondemos a los mensajes de MyChart en el transcurso de 1 a 2  das hbiles.  Para renovar recetas, por favor pida a su farmacia que se ponga en contacto con nuestra oficina. Nuestro nmero de fax es el [phone].  Si tiene un asunto urgente cuando la clnica est cerrada y que no puede esperar hasta el siguiente da hbil, puede llamar/localizar a su doctor(a) al nmero que aparece a continuacin.   Por favor, tenga en cuenta que aunque hacemos todo lo posible para estar disponibles para asuntos urgentes fuera del horario de oficina, no estamos disponibles las 24 horas del da, los 7 das de la semana.   Si tiene un problema urgente y no puede comunicarse con nosotros, puede optar por buscar atencin mdica  en el consultorio de su doctor(a), en una clnica privada, en un centro de atencin urgente o en una sala de emergencias.  Si tiene una emergencia mdica, por favor llame inmediatamente al 911 o vaya a la sala de emergencias.  Nmeros de bper  - Dr. Kowalski: [phone]  - Dra. Moye: [phone]  - Dra. Stewart: [phone]  En caso de inclemencias del tiempo, por favor llame a nuestra lnea principal al [phone] para una actualizacin sobre el estado de cualquier retraso o cierre.  Consejos para la medicacin en dermatologa: Por favor, guarde las cajas en las que vienen los medicamentos de uso tpico para ayudarle a seguir las instrucciones sobre dnde y cmo usarlos. Las farmacias generalmente imprimen las instrucciones del medicamento slo en las cajas y no directamente en los tubos del medicamento.   Si su medicamento es muy caro, por favor, pngase en contacto con nuestra oficina llamando al [phone] y presione la opcin 4 o envenos un mensaje a travs de MyChart.   No podemos decirle cul ser su copago por los medicamentos por adelantado ya que esto es diferente dependiendo de la cobertura de su seguro. Sin embargo, es posible que podamos encontrar un medicamento sustituto a menor costo o llenar un formulario para que el  seguro cubra el medicamento que se considera necesario.   Si se requiere una autorizacin previa para que su compaa de seguros cubra su medicamento, por favor permtanos de 1 a 2 das hbiles para completar este proceso.  Los precios de los medicamentos varan con frecuencia dependiendo del lugar de dnde se surte la receta y alguna farmacias pueden ofrecer precios ms baratos.  El sitio web www.goodrx.com tiene cupones para medicamentos de diferentes farmacias. Los precios aqu no tienen en cuenta lo que podra costar con la ayuda del seguro (puede ser ms barato con su seguro), pero el sitio web puede darle el precio si no utiliz ningn seguro.  - Puede imprimir el cupn correspondiente y llevarlo con su receta a la farmacia.  - Tambin puede pasar por nuestra oficina durante el horario de atencin regular y recoger una tarjeta de cupones de GoodRx.  - Si necesita que su receta se enve electrnicamente a una farmacia diferente, informe a nuestra oficina a travs de MyChart de [place]   o por telfono llamando al [phone] y presione la opcin 4.  

## 2022-02-27 NOTE — Progress Notes (Signed)
   New Patient Visit  Subjective  Sus[facility] is a 76 y.o. female who presents for the following: Irritated skin lesion] (On the L upper eyelid - patient is concerned and would like it checked today). The patient has spots, moles and lesions to be evaluated, some may be new or changing and the patient has concerns.  The following portions of the chart were reviewed this encounter and updated as appropriate:   Tobacco  Allergies  Meds  Problems  Med Hx  Surg Hx  Fam Hx     Review of Systems:  No other skin or systemic complaints except as noted in HPI or Assessment and Plan.  Objective  Well appearing patient in no apparent distress; mood and affect are within normal limits.  A focused examination was performed including the face. Relevant physical exam findings are noted in the Assessment and Plan.  L upper eyelid Erythematous stuck-on, waxy papule or plaque 0.5 x 0 .4 cm.    Assessment & Plan  Inflamed seborrheic keratosis L upper eyelid Patient deferred treatment at this time.  Seborrheic Keratoses Neck and face - Stuck-on, waxy, tan-brown papules and/or plaques  - Benign-appearing - Discussed benign etiology and prognosis. - Observe - Call for any changes  Actinic Damage - chronic, secondary to cumulative UV radiation exposure/sun exposure over time - diffuse scaly erythematous macules with underlying dyspigmentation - Recommend daily broad spectrum sunscreen SPF 30+ to sun-exposed areas, reapply every 2 hours as needed.  - Recommend staying in the shade or wearing long sleeves, sun glasses (UVA+UVB protection) and wide brim hats (4-inch brim around the entire circumference of the hat). - Call for new or changing lesions.  Return if symptoms worsen or fail to improve.  Luther Redo, CMA, am acting as scribe for Sarina Ser, MD . Documentation: I have reviewed the above documentation for accuracy and completeness, and I agree with the  above.  Sarina Ser, MD

## 2022-03-09 DIAGNOSIS — E0590 Thyrotoxicosis, unspecified without thyrotoxic crisis or storm: Secondary | ICD-10-CM | POA: Diagnosis not present

## 2022-03-10 ENCOUNTER — Encounter: Payer: Self-pay | Admitting: Dermatology

## 2022-03-24 ENCOUNTER — Encounter: Payer: Self-pay | Admitting: Urology

## 2022-03-24 ENCOUNTER — Ambulatory Visit: Payer: Medicare Other | Admitting: Urology

## 2022-03-24 VITALS — BP 147/83 | HR 68 | Ht 64.5 in | Wt 155.0 lb

## 2022-03-24 DIAGNOSIS — N3281 Overactive bladder: Secondary | ICD-10-CM | POA: Diagnosis not present

## 2022-03-24 DIAGNOSIS — R350 Frequency of micturition: Secondary | ICD-10-CM | POA: Diagnosis not present

## 2022-03-24 LAB — URINALYSIS, COMPLETE
Bilirubin, UA: NEGATIVE
Glucose, UA: NEGATIVE
Ketones, UA: NEGATIVE
Leukocytes,UA: NEGATIVE
Nitrite, UA: NEGATIVE
Protein,UA: NEGATIVE
RBC, UA: NEGATIVE
Specific Gravity, UA: 1.02 (ref 1.005–1.030)
Urobilinogen, Ur: 0.2 mg/dL (ref 0.2–1.0)
pH, UA: 5.5 (ref 5.0–7.5)

## 2022-03-24 LAB — MICROSCOPIC EXAMINATION

## 2022-03-24 LAB — BLADDER SCAN AMB NON-IMAGING: Scan Result: 0

## 2022-03-24 MED ORDER — GEMTESA 75 MG PO TABS: 75.0000 mg | ORAL_TABLET | Freq: Every day | ORAL | 0 refills | Status: DC

## 2022-03-24 NOTE — Progress Notes (Signed)
03/24/2022 9:58 AM   Susan Woods [birth year] [id number]  Referring provider: Abner Greenspan, MD [address] [place],  [place] [address]  Chief Complaint  Patient presents with   New Patient (Initial Visit)    HPI: Susan Woods is a 77 y.o. self-referred for incomplete bladder emptying.  Previously followed by Dr. Janice Woods for incomplete bladder emptying, urethral syndrome and meatal stenosis.  She underwent periodic urethral dilations from 2004-2014.  Reviewing notes remarkable for residuals in the 300 range at the time of dilation She has recently noted increased urinary frequency, urgency, suprapubic pressure and voiding small amounts with sensation of incomplete emptying No dysuria, gross hematuria Denies recurrent UTI   PMH: Past Medical History:  Diagnosis Date   Anemia    Arthritis    in hips   GERD (gastroesophageal reflux disease)    History of hypothyroidism    no current meds   HTN (hypertension)    echo (8/11): EF 55%, mild MR, mild TR   Nasal pruritis    full body w/o rash    Neuromuscular disorder (HCC)    cramps occ after working out   Nonallergic rhinitis    Osteoporosis    Psoriasis    mild intermittent    Tingling    in hands of ? etiol    Surgical History: Past Surgical History:  Procedure Laterality Date   2D echo  10/2009   [facility]   with partial 1 ovary removed   ABDOMINAL SURGERY  1976   adhesions   CESAREAN SECTION  1971   COLONOSCOPY  2006   dexa - osteopenia  2005   dexa 2009 - osteoporosis/fairly stable    hosp TIA  10/2009   no problems since, told in hospital not [place]  2002   OOPHORECTOMY     ORIF FINGER / THUMB FRACTURE Right 02/2005   ovary removed  1976   removal of internal stitch that did not [place] Left 10/25/2020   11/25/20   TRANSANAL HEMORRHOIDAL DEARTERIALIZATION N/A 08/22/2019   Procedure:  TRANSANAL HEMORRHOIDAL DEARTERIALIZATION;  Surgeon: Leighton Ruff, MD;  Location: [facility];  Service: General;  Laterality: N/A;   urethral stricutre  1989 or 1990   dilated   VESICOVAGINAL FISTULA CLOSURE W/ TAH  1975    Home Medications:  Allergies as of 03/24/2022       Reactions   Amlodipine Swelling   Ankle swelling   Azithromycin    Heart palpitations on z pack   Levaquin [levofloxacin In D5w] Other (See Comments)   Muscle pain    Penicillins    REACTION: urticaria (hives), knots in abdomen easy bruising   Shrimp [shellfish Allergy]    Throat tries to close   Sulfonamide Derivatives    REACTION: tingling around mouth and hands felt tight   Povidone-iodine Hives, Itching        Medication List        Accurate as of March 24, 2022  9:58 AM. If you have any questions, ask your nurse or doctor.          amLODipine 5 MG tablet Commonly known as: NORVASC TAKE 1 TABLET BY MOUTH DAILY   cyclobenzaprine 10 MG tablet Commonly known as: FLEXERIL TAKE 1/2-1 TABLET BY MOUTH 3 TIMES DAILY AS NEEDED FOR MUSCLE SPASMS (WATCH FOR SEDATION)   Ferrous Sulfate 250 MG  Cpcr Take 1 capsule by mouth daily.   fish oil-omega-3 fatty acids 1000 MG capsule Take 2 g by mouth daily. Alternate with flax oil  500 mg in am   fluticasone 50 MCG/ACT nasal spray Commonly known as: FLONASE USE 2 SPRAYS IN BOTH NOSTRILS  DAILY   hydrochlorothiazide 25 MG tablet Commonly known as: HYDRODIURIL TAKE 1 TABLET BY MOUTH DAILY   levocetirizine 5 MG tablet Commonly known as: XYZAL TAKE 1 TABLET BY MOUTH DAILY AS  NEEDED   losartan 100 MG tablet Commonly known as: COZAAR TAKE 1 TABLET BY MOUTH DAILY   magnesium 30 MG tablet Take 30 mg by mouth daily.   metoprolol succinate 100 MG 24 hr tablet Commonly known as: TOPROL-XL TAKE 1 TABLET BY MOUTH ONCE  DAILY WITH A MEAL OR IMMEDIATELY FOLLOWING A MEAL   multivitamin tablet Take 1 tablet by mouth daily.   NON  FORMULARY similisan dry eye drops daily in am   Vitamin D3 25 MCG (1000 UT) Caps Take 1 capsule by mouth daily.        Allergies:  Allergies  Allergen Reactions   Amlodipine Swelling    Ankle swelling   Azithromycin     Heart palpitations on z pack   Levaquin [Levofloxacin In D5w] Other (See Comments)    Muscle pain    Penicillins     REACTION: urticaria (hives), knots in abdomen easy bruising   Shrimp [Shellfish Allergy]     Throat tries to close   Sulfonamide Derivatives     REACTION: tingling around mouth and hands felt tight   Povidone-Iodine Hives and Itching    Family History: Family History  Problem Relation Age of Onset   Cancer Father        throat CA   Diabetes Father    Heart disease Mother        MI late 68s - CHF    Hypertension Mother    Stroke Brother    Diabetes Brother    Heart disease Sister        CHF   Diabetes Sister    Hypertension Brother    Diabetes Brother     Social History:  reports that she quit smoking about 34 years ago. Her smoking use included cigarettes. She has never used smokeless tobacco. She reports current alcohol use. She reports that she does not use drugs.   Physical Exam: BP (!) 147/83   Pulse 68   Ht 5' 4.5" (1.638 m)   Wt 155 lb (70.3 kg)   BMI 26.19 kg/m   Constitutional:  Alert and oriented, No acute distress. HEENT: [place] AT Respiratory: Normal respiratory effort, no increased work of breathing. Psychiatric: Normal mood and affect.  Laboratory Data:  Urinalysis Dipstick/microscopy negative   Assessment & Plan:    1.  Overactive bladder Current symptoms consistent with overactive bladder PVR 0 mL Trial Gemtesa 75 mg daily-samples given x 4 weeks Follow-up visit 1 month and bothersome symptoms will perform cystoscopy at follow-up visit   Susan Woods, [facility] [address] [place], [place] [address] [phone]

## 2022-03-29 ENCOUNTER — Telehealth: Payer: Self-pay | Admitting: Family Medicine

## 2022-03-29 NOTE — Telephone Encounter (Signed)
Called and spoke to pt. I did tell her did that I do not see a referral other then the one she went to back in November for her Mammo.

## 2022-03-29 NOTE — Telephone Encounter (Signed)
Pt called requesting to speak to [place] regarding a referral to [facility] from Dr. Glori Bickers. Pt states she was unaware about a referral being sent out & wanted to know what was it for? Call back # [phone]

## 2022-04-04 DIAGNOSIS — H43811 Vitreous degeneration, right eye: Secondary | ICD-10-CM | POA: Diagnosis not present

## 2022-04-04 DIAGNOSIS — H30042 Focal chorioretinal inflammation, macular or paramacular, left eye: Secondary | ICD-10-CM | POA: Diagnosis not present

## 2022-04-04 DIAGNOSIS — H43391 Other vitreous opacities, right eye: Secondary | ICD-10-CM | POA: Diagnosis not present

## 2022-04-04 DIAGNOSIS — H2511 Age-related nuclear cataract, right eye: Secondary | ICD-10-CM | POA: Diagnosis not present

## 2022-04-12 DIAGNOSIS — H30042 Focal chorioretinal inflammation, macular or paramacular, left eye: Secondary | ICD-10-CM | POA: Diagnosis not present

## 2022-04-21 ENCOUNTER — Other Ambulatory Visit: Payer: Medicare Other | Admitting: Urology

## 2022-04-24 ENCOUNTER — Telehealth: Payer: Self-pay | Admitting: Family Medicine

## 2022-04-24 NOTE — Telephone Encounter (Signed)
Patient declined awv today.Please discuss with patient.

## 2022-04-27 ENCOUNTER — Ambulatory Visit: Payer: Medicare Other

## 2022-04-27 ENCOUNTER — Ambulatory Visit: Payer: Medicare Other | Admitting: Urology

## 2022-04-27 ENCOUNTER — Encounter: Payer: Self-pay | Admitting: Urology

## 2022-04-27 VITALS — BP 147/87 | HR 64 | Ht 65.5 in | Wt 155.0 lb

## 2022-04-27 DIAGNOSIS — R8281 Pyuria: Secondary | ICD-10-CM

## 2022-04-27 DIAGNOSIS — N3281 Overactive bladder: Secondary | ICD-10-CM | POA: Diagnosis not present

## 2022-04-27 DIAGNOSIS — N343 Urethral syndrome, unspecified: Secondary | ICD-10-CM

## 2022-04-27 LAB — URINALYSIS, COMPLETE
Bilirubin, UA: NEGATIVE
Glucose, UA: NEGATIVE
Ketones, UA: NEGATIVE
Nitrite, UA: NEGATIVE
Protein,UA: NEGATIVE
RBC, UA: NEGATIVE
Specific Gravity, UA: 1.025 (ref 1.005–1.030)
Urobilinogen, Ur: 0.2 mg/dL (ref 0.2–1.0)
pH, UA: 5 (ref 5.0–7.5)

## 2022-04-27 LAB — MICROSCOPIC EXAMINATION

## 2022-04-27 MED ORDER — NITROFURANTOIN MONOHYD MACRO 100 MG PO CAPS: 100.0000 mg | ORAL_CAPSULE | Freq: Two times a day (BID) | ORAL | 0 refills | Status: DC

## 2022-04-27 NOTE — Progress Notes (Signed)
   04/27/22  CC:  Chief Complaint  Patient presents with   Cysto    HPI: Refer to my prior note 03/24/2022.  Blood pressure (!) 147/87, pulse 64, height 5' 5.5" (1.664 m), weight 155 lb (70.3 kg). NED. A&Ox3.   No respiratory distress   Abd soft, NT, ND Normal external genitalia with patent urethral meatus  Cystoscopy Procedure Note  Patient identification was confirmed, informed consent was obtained, and patient was prepped using Betadine solution.  Lidocaine jelly was administered per urethral meatus.    Procedure: - A 14 French [place]ther sound was placed in the bladder with a PVR ~ 30 cc.  The urethra was tight and subsequent sounding to 20 [place] was performed - Flexible cystoscope introduced, without any difficulty.   - Thorough search of the bladder revealed:    normal urethral meatus    normal urothelium    no stones    no ulcers     no tumors    no urethral polyps    no trabeculation  - Ureteral orifices were normal in position and appearance.  Post-Procedure: - Patient tolerated the procedure well  Assessment/ Plan: Voiding symptoms have previously improved with sounding from her prior urologist UA today with pyuria and urine culture was ordered.  Rx Macrobid 100 mg twice daily sent to pharmacy pending culture results Instructed to call for persistent symptoms.  Follow-up as needed for recurrent symptoms    Abbie Sons, MD

## 2022-04-30 LAB — CULTURE, URINE COMPREHENSIVE

## 2022-05-02 DIAGNOSIS — H2511 Age-related nuclear cataract, right eye: Secondary | ICD-10-CM | POA: Diagnosis not present

## 2022-05-02 DIAGNOSIS — Z9842 Cataract extraction status, left eye: Secondary | ICD-10-CM | POA: Diagnosis not present

## 2022-05-02 DIAGNOSIS — H5211 Myopia, right eye: Secondary | ICD-10-CM | POA: Diagnosis not present

## 2022-05-02 DIAGNOSIS — H524 Presbyopia: Secondary | ICD-10-CM | POA: Diagnosis not present

## 2022-05-02 DIAGNOSIS — H59812 Chorioretinal scars after surgery for detachment, left eye: Secondary | ICD-10-CM | POA: Diagnosis not present

## 2022-05-02 DIAGNOSIS — H04123 Dry eye syndrome of bilateral lacrimal glands: Secondary | ICD-10-CM | POA: Diagnosis not present

## 2022-05-10 DIAGNOSIS — H43391 Other vitreous opacities, right eye: Secondary | ICD-10-CM | POA: Diagnosis not present

## 2022-05-10 DIAGNOSIS — H2511 Age-related nuclear cataract, right eye: Secondary | ICD-10-CM | POA: Diagnosis not present

## 2022-05-10 DIAGNOSIS — H43811 Vitreous degeneration, right eye: Secondary | ICD-10-CM | POA: Diagnosis not present

## 2022-05-10 DIAGNOSIS — H30042 Focal chorioretinal inflammation, macular or paramacular, left eye: Secondary | ICD-10-CM | POA: Diagnosis not present

## 2022-05-22 ENCOUNTER — Ambulatory Visit: Payer: Medicare Other | Admitting: Podiatry

## 2022-05-24 ENCOUNTER — Ambulatory Visit (INDEPENDENT_AMBULATORY_CARE_PROVIDER_SITE_OTHER): Payer: Medicare Other | Admitting: Family Medicine

## 2022-05-24 ENCOUNTER — Encounter: Payer: Self-pay | Admitting: Family Medicine

## 2022-05-24 VITALS — BP 131/80 | HR 61 | Temp 97.8°F | Ht 65.5 in | Wt 158.1 lb

## 2022-05-24 DIAGNOSIS — J069 Acute upper respiratory infection, unspecified: Secondary | ICD-10-CM | POA: Diagnosis not present

## 2022-05-24 DIAGNOSIS — R0989 Other specified symptoms and signs involving the circulatory and respiratory systems: Secondary | ICD-10-CM

## 2022-05-24 LAB — POCT INFLUENZA A/B
Influenza A, POC: NEGATIVE
Influenza B, POC: NEGATIVE

## 2022-05-24 LAB — POC COVID19 BINAXNOW: SARS Coronavirus 2 Ag: NEGATIVE

## 2022-05-24 MED ORDER — ALBUTEROL SULFATE HFA 108 (90 BASE) MCG/ACT IN AERS: 2.0000 | INHALATION_SPRAY | RESPIRATORY_TRACT | 0 refills | Status: DC | PRN

## 2022-05-24 NOTE — Progress Notes (Signed)
Subjective:    Patient ID: Susan Woods, female    DOB: January 24, 1946, 77 y.o.   MRN: [id number]  HPI Pt presents for uri symptoms   Wt Readings from Last 3 Encounters:  05/24/22 158 lb 2 oz (71.7 kg)  04/27/22 155 lb (70.3 kg)  03/24/22 155 lb (70.3 kg)   25.91 kg/m  Vitals:   05/24/22 1229  BP: (!) 140/94  Pulse: 61  Temp: 97.8 F (36.6 C)  SpO2: 95%   Symptoms started Saturday   ST- comes and goes / worse at night  Coughing - not deep  Sneezing  Lot of pnd  Mucous is yellow to green - mostly from throat  No fever   Little wheezing  No sob   No loss of taste/smell   Otc Chlorcedin hbp Generic mucinex Zyrtec Flonase   Neg covid and flu tests today   Patient Active Problem List   Diagnosis Date Noted   Low TSH level 01/31/2022   Skin lesion of face 07/11/2021   Vitamin D deficiency 01/27/2021   Rectal bleeding 04/21/2019   Viral URI with cough 08/25/2016   Internal hemorrhoids 04/27/2016   Hypothyroid 01/13/2016   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   Colon cancer screening 12/22/2014   Hypertensive retinopathy of both eyes, grade 1 12/16/2013   Encounter for Medicare annual wellness exam 12/31/2012   Routine general medical examination at a health care facility 02/20/2011   MENINGIOMA 11/17/2009   Osteoporosis 03/19/2007   HYPOGLYCEMIA 06/12/2006   Essential hypertension 06/12/2006   Allergic rhinitis 06/12/2006   GERD 06/12/2006   Past Medical History:  Diagnosis Date   Anemia    Arthritis    in hips   GERD (gastroesophageal reflux disease)    History of hypothyroidism    no current meds   HTN (hypertension)    echo (8/11): EF 55%, mild MR, mild TR   Nasal pruritis    full body w/o rash    Neuromuscular disorder (HCC)    cramps occ after working out   Nonallergic rhinitis    Osteoporosis    Psoriasis    mild intermittent    Tingling    in hands of ? etiol   Past Surgical History:  Procedure Laterality Date   2D echo  10/2009    [place]   with partial 1 ovary removed   ABDOMINAL SURGERY  1976   adhesions   CESAREAN SECTION  1971   COLONOSCOPY  2006   dexa - osteopenia  2005   dexa 2009 - osteoporosis/fairly stable    hosp TIA  10/2009   no problems since, told in hospital not [place]  2002   OOPHORECTOMY     ORIF FINGER / THUMB FRACTURE Right 02/2005   ovary removed  1976   removal of internal stitch that did not [place] Left 10/25/2020   11/25/20   TRANSANAL HEMORRHOIDAL DEARTERIALIZATION N/A 08/22/2019   Procedure: TRANSANAL HEMORRHOIDAL DEARTERIALIZATION;  Surgeon: Leighton Ruff, MD;  [facility];  Service: General;  Laterality: N/A;   urethral stricutre  1989 or 1990   dilated   VESICOVAGINAL FISTULA CLOSURE W/ TAH  1975   Social History   Tobacco Use   Smoking status: Former    Years: 5.00    Types: Cigarettes    Quit date: 04/08/1987    Years since quitting: 35.1   Smokeless tobacco:  Never   Tobacco comments:    1-2 cig per day  Vaping Use   Vaping Use: Never used  Substance Use Topics   Alcohol use: Yes    Comment: occasional   Drug use: No   Family History  Problem Relation Age of Onset   Cancer Father        throat CA   Diabetes Father    Heart disease Mother        MI late 20s - CHF    Hypertension Mother    Stroke Brother    Diabetes Brother    Heart disease Sister        CHF   Diabetes Sister    Hypertension Brother    Diabetes Brother    Allergies  Allergen Reactions   Amlodipine Swelling    Ankle swelling   Azithromycin     Heart palpitations on z pack   Levaquin [Levofloxacin In D5w] Other (See Comments)    Muscle pain    Penicillins     REACTION: urticaria (hives), knots in abdomen easy bruising   Shrimp [Shellfish Allergy]     Throat tries to close   Sulfonamide Derivatives     REACTION: tingling around mouth and hands felt tight   Povidone-Iodine  Hives and Itching   Current Outpatient Medications on File Prior to Visit  Medication Sig Dispense Refill   amLODipine (NORVASC) 5 MG tablet TAKE 1 TABLET BY MOUTH DAILY 80 tablet 3   Cholecalciferol (VITAMIN D3) 1000 UNITS CAPS Take 1 capsule by mouth daily.     cyclobenzaprine (FLEXERIL) 10 MG tablet TAKE 1/2-1 TABLET BY MOUTH 3 TIMES DAILY AS NEEDED FOR MUSCLE SPASMS (WATCH FOR SEDATION) 30 tablet 3   Ferrous Sulfate 250 MG CPCR Take 1 capsule by mouth daily.     fish oil-omega-3 fatty acids 1000 MG capsule Take 2 g by mouth daily. Alternate with flax oil  500 mg in am     fluticasone (FLONASE) 50 MCG/ACT nasal spray USE 2 SPRAYS IN BOTH NOSTRILS  DAILY 48 g 2   hydrochlorothiazide (HYDRODIURIL) 25 MG tablet TAKE 1 TABLET BY MOUTH DAILY 80 tablet 3   levocetirizine (XYZAL) 5 MG tablet TAKE 1 TABLET BY MOUTH DAILY AS  NEEDED 90 tablet 2   losartan (COZAAR) 100 MG tablet TAKE 1 TABLET BY MOUTH DAILY 80 tablet 3   magnesium 30 MG tablet Take 30 mg by mouth daily.     metoprolol succinate (TOPROL-XL) 100 MG 24 hr tablet TAKE 1 TABLET BY MOUTH ONCE  DAILY WITH A MEAL OR IMMEDIATELY FOLLOWING A MEAL 80 tablet 3   Multiple Vitamin (MULTIVITAMIN) tablet Take 1 tablet by mouth daily.     NON FORMULARY similisan dry eye drops daily in am     No current facility-administered medications on file prior to visit.     Review of Systems  Constitutional:  Positive for fatigue. Negative for appetite change and fever.  HENT:  Positive for congestion, postnasal drip, rhinorrhea, sinus pressure, sneezing and sore throat. Negative for ear pain.   Eyes:  Negative for pain and discharge.  Respiratory:  Positive for cough and wheezing. Negative for shortness of breath and stridor.   Cardiovascular:  Negative for chest pain.  Gastrointestinal:  Negative for diarrhea, nausea and vomiting.  Genitourinary:  Negative for frequency, hematuria and urgency.  Musculoskeletal:  Negative for arthralgias and myalgias.   Skin:  Negative for rash.  Neurological:  Negative for dizziness, weakness, light-headedness and headaches.  Psychiatric/Behavioral:  Negative for confusion and dysphoric mood.        Objective:   Physical Exam Constitutional:      General: She is not in acute distress.    Appearance: Normal appearance. She is well-developed and normal weight. She is not ill-appearing, toxic-appearing or diaphoretic.  HENT:     Head: Normocephalic and atraumatic.     Comments: Nares are injected and congested      Right Ear: Tympanic membrane, ear canal and external ear normal.     Left Ear: Tympanic membrane, ear canal and external ear normal.     Nose: Congestion and rhinorrhea present.     Mouth/Throat:     Mouth: Mucous membranes are moist.     Pharynx: Oropharynx is clear. No oropharyngeal exudate or posterior oropharyngeal erythema.     Comments: Clear pnd  Eyes:     General:        Right eye: No discharge.        Left eye: No discharge.     Conjunctiva/sclera: Conjunctivae normal.     Pupils: Pupils are equal, round, and reactive to light.  Cardiovascular:     Rate and Rhythm: Normal rate.     Heart sounds: Normal heart sounds.  Pulmonary:     Effort: Pulmonary effort is normal. No respiratory distress.     Breath sounds: Normal breath sounds. No stridor. No wheezing, rhonchi or rales.     Comments: Good air exch No wheeze even with forced exp Chest:     Chest wall: No tenderness.  Musculoskeletal:     Cervical back: Normal range of motion and neck supple.  Lymphadenopathy:     Cervical: No cervical adenopathy.  Skin:    General: Skin is warm and dry.     Capillary Refill: Capillary refill takes less than 2 seconds.     Findings: No rash.  Neurological:     Mental Status: She is alert.     Cranial Nerves: No cranial nerve deficit.  Psychiatric:        Mood and Affect: Mood normal.           Assessment & Plan:   Problem List Items Addressed This Visit        Respiratory   Viral URI with cough - Primary    Reassuring exam/no wheezing  Day 4-5  Neg flu and covid tests  Px albuterol mdi with inst for use prn  Disc sympt care-see AVS Disc ER precautions Update if not starting to improve in a week or if worsening         Other Visit Diagnoses     Chest congestion       Relevant Orders   POCT Influenza A/B (Completed)   POC COVID-19 (Completed)

## 2022-05-24 NOTE — Assessment & Plan Note (Signed)
Reassuring exam/no wheezing  Day 4-5  Neg flu and covid tests  Px albuterol mdi with inst for use prn  Disc sympt care-see AVS Disc ER precautions Update if not starting to improve in a week or if worsening

## 2022-05-24 NOTE — Patient Instructions (Addendum)
Drink fluids and rest  mucinex DM is good for cough and congestion  Nasal saline for congestion as needed  Continue the generic flonase also  Continue the generic zyrtec  Salt water gargle is great  Tylenol for fever or pain or headache  Please alert [place] if symptoms worsen (if severe or short of breath please go to the ER)    I will send an albuterol inhaler for wheezing (only if needed)   Update if not starting to improve in a week or if worsening   Watch for  Fever  Increased wheezing Trouble breathing  Facial pain    Keep [place] posted

## 2022-05-26 ENCOUNTER — Telehealth: Payer: Self-pay | Admitting: Family Medicine

## 2022-05-26 MED ORDER — LIDOCAINE VISCOUS HCL 2 % MT SOLN: 15.0000 mL | Freq: Four times a day (QID) | OROMUCOSAL | 0 refills | Status: DC | PRN

## 2022-05-26 NOTE — Telephone Encounter (Signed)
Pt called stating she saw [facility] earlier this week on 05/24/22 for sore throat. Pt stated her sister is experiencing the same thing & was prescribed Lidocaine. Pt asked could she be prescribed it as well, to help with sore throat? Preferred pharmacy is [id number] - [place], [place]. Call back # [phone]

## 2022-05-26 NOTE — Telephone Encounter (Signed)
I sent it  Use with caution  It will numb mouth- making it easier to bite tongue/etc   Let us know if sore throat becomes severe at any time or if fever or trouble swallowing

## 2022-05-26 NOTE — Addendum Note (Signed)
Addended by: Loura Pardon A on: 05/26/2022 12:51 PM   Modules accepted: Orders

## 2022-05-26 NOTE — Telephone Encounter (Signed)
Called and clarified with patient what she was requesting.   Patient had appt with Dr. Glori Bickers 05/24/22 and states the post nasal drip is causing her to have a sore throat at night still. She stated her sister was given by her provider Lidocaine viscous solution to gargle with and this has helped soothe her sisters sore throat a lot. The patient is requesting if you would prescribe the same thing for her.

## 2022-05-26 NOTE — Telephone Encounter (Signed)
Called patient and reviewed all information. Patient verbalized understanding. Will call if any further questions.  

## 2022-06-05 ENCOUNTER — Ambulatory Visit: Payer: Medicare Other | Admitting: Podiatry

## 2022-06-12 ENCOUNTER — Encounter: Payer: Self-pay | Admitting: Podiatry

## 2022-06-12 ENCOUNTER — Ambulatory Visit: Payer: Medicare Other | Admitting: Podiatry

## 2022-06-12 DIAGNOSIS — M2041 Other hammer toe(s) (acquired), right foot: Secondary | ICD-10-CM | POA: Diagnosis not present

## 2022-06-12 DIAGNOSIS — L84 Corns and callosities: Secondary | ICD-10-CM | POA: Diagnosis not present

## 2022-06-12 NOTE — Patient Instructions (Signed)
Look for urea 40% cream or ointment and apply to the thickened dry skin / calluses. This can be bought over the counter, at a pharmacy or online such as [employer].    More silicone pads can be purchased from:  [email or web address]  

## 2022-06-12 NOTE — Progress Notes (Signed)
  Subjective:  Patient ID: Susan Woods, female    DOB: [birth year],  MRN: [id number]  Chief Complaint  Patient presents with   Nail Problem    NP- R pinky toe nail is cracked.    77 y.o. female presents with the above complaint. History confirmed with patient.  She notes thickening of the outside of the right fifth toenail which is causing pain  Objective:  Physical Exam: warm, good capillary refill, no trophic changes or ulcerative lesions, normal DP and PT pulses, normal sensory exam, and painful listers corn right lateral fifth toe  Assessment:   1. Corn of toe   2. Hammertoe of right foot      Plan:  Patient was evaluated and treated and all questions answered.  We discussed etiology and treatment options of listers corn and how this contributes from an adductovarus attitude of the fifth toe.  Discussed surgical nonsurgical treatment.  I debrided the lesion as a courtesy today and recommend she use urea cream and offloading silicone pads which were dispensed.  Return to see me as needed if it does not improve or worsens  Return if symptoms worsen or fail to improve.

## 2022-06-23 ENCOUNTER — Telehealth: Payer: Self-pay | Admitting: Podiatry

## 2022-06-23 NOTE — Telephone Encounter (Signed)
Pt called and went to [facility] to get the 40% urea cream and they did could not find any that said 40%. She did get the gold bond cream that has urea in it but does not give the percentage. She states her feet are looking/doing better but wanted to see if you had any ideas where to get it besides [place]? I did call pt and tell her we sold it in our office and it is 22.00 plus tax. She is going to think about it as the one she has is working for her.

## 2022-07-27 ENCOUNTER — Other Ambulatory Visit: Payer: Self-pay | Admitting: Family Medicine

## 2022-08-09 DIAGNOSIS — H43811 Vitreous degeneration, right eye: Secondary | ICD-10-CM | POA: Diagnosis not present

## 2022-08-09 DIAGNOSIS — H43391 Other vitreous opacities, right eye: Secondary | ICD-10-CM | POA: Diagnosis not present

## 2022-08-09 DIAGNOSIS — H30042 Focal chorioretinal inflammation, macular or paramacular, left eye: Secondary | ICD-10-CM | POA: Diagnosis not present

## 2022-08-14 ENCOUNTER — Telehealth: Payer: Self-pay | Admitting: Family Medicine

## 2022-08-14 DIAGNOSIS — H1131 Conjunctival hemorrhage, right eye: Secondary | ICD-10-CM | POA: Diagnosis not present

## 2022-08-14 NOTE — Telephone Encounter (Signed)
Pt called in requesting a call back regarding medication concern RX losartan (COZAAR) 100 MG tablet [phone]

## 2022-08-14 NOTE — Telephone Encounter (Signed)
Patient states she has been reading the side effects of the losartan 100mg  and would like to discuss with Dr. Milinda Antis about going on a different medication. Scheduled patient appt on 08/18/22

## 2022-08-14 NOTE — Telephone Encounter (Signed)
Called patient, she was unable to speak at the moment she stated she will call back.

## 2022-08-18 ENCOUNTER — Encounter: Payer: Self-pay | Admitting: Family Medicine

## 2022-08-18 ENCOUNTER — Ambulatory Visit (INDEPENDENT_AMBULATORY_CARE_PROVIDER_SITE_OTHER): Payer: Medicare Other | Admitting: Family Medicine

## 2022-08-18 VITALS — BP 134/84 | HR 66 | Temp 97.6°F | Ht 65.5 in | Wt 159.1 lb

## 2022-08-18 DIAGNOSIS — D320 Benign neoplasm of cerebral meninges: Secondary | ICD-10-CM | POA: Diagnosis not present

## 2022-08-18 DIAGNOSIS — I10 Essential (primary) hypertension: Secondary | ICD-10-CM

## 2022-08-18 NOTE — Assessment & Plan Note (Signed)
Had headache with losartan but resolved now off of it  No clinical changes

## 2022-08-18 NOTE — Assessment & Plan Note (Addendum)
BP: 134/84  Pt is not tolerating losartan  (100 mg causes back pain/dizziness and HA)  Continues Hctz 25 mg daily  Amlodipine 5 mg daily  Normal exam  Reviewed health habits (good) and last cmp   Will continue to monitor here and at home

## 2022-08-18 NOTE — Patient Instructions (Signed)
Keep takig good care of yourself   Continue chair yoga  Continue good diet    Be safe on your trip   Continue to hold the losartan and watch blood pressure Continue other medicines

## 2022-08-18 NOTE — Progress Notes (Signed)
Subjective:    Patient ID: Susan Woods, female    DOB: 1945/11/21, 77 y.o.   MRN: [id number]  HPI Pt presents for f/u of HTN   Wt Readings from Last 3 Encounters:  08/18/22 159 lb 2 oz (72.2 kg)  05/24/22 158 lb 2 oz (71.7 kg)  04/27/22 155 lb (70.3 kg)   26.08 kg/m  Vitals:   08/18/22 1202  BP: 134/84  Pulse: 66  Temp: 97.6 F (36.4 C)  SpO2: 98%    Stopped losartan on Monday due to back pain and some dizziness  She read about it /side effects and saw this as a side effects  Since she stopped it back pain is better     BP Readings from Last 3 Encounters:  08/18/22 134/84  05/24/22 131/80  04/27/22 (!) 147/87   Amlodipine 5 mg daily  Hctz 25 mg daily  Losartan 100 mg daily -stopped monday Metoprolol xl 100 mg daily   Pulse Readings from Last 3 Encounters:  08/18/22 66  05/24/22 61  04/27/22 64   Has h/o hypertensive retinopathy   Her cuff runs slt higher than outs   CMP     Component Value Date/Time   NA 142 01/30/2022 1017   K 4.0 01/30/2022 1017   CL 104 01/30/2022 1017   CO2 30 01/30/2022 1017   GLUCOSE 105 (H) 01/30/2022 1017   BUN 13 01/30/2022 1017   CREATININE 0.81 01/30/2022 1017   CALCIUM 9.4 01/30/2022 1017   PROT 6.9 01/30/2022 1017   ALBUMIN 4.2 01/30/2022 1017   AST 16 01/30/2022 1017   ALT 8 01/30/2022 1017   ALKPHOS 62 01/30/2022 1017   BILITOT 0.7 01/30/2022 1017   GFR 71.56 01/30/2022 1017   GFRNONAA >60 10/28/2016 1938   Eye exam was last week- happy with everything Has another injections on 6/18   Has a mission trip coming up   Patient Active Problem List   Diagnosis Date Noted   Low TSH level 01/31/2022   Skin lesion of face 07/11/2021   Vitamin D deficiency 01/27/2021   Rectal bleeding 04/21/2019   Internal hemorrhoids 04/27/2016   Hypothyroid 01/13/2016   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   Colon cancer screening 12/22/2014   Hypertensive retinopathy of both eyes, grade 1 12/16/2013   Encounter for  Medicare annual wellness exam 12/31/2012   Routine general medical examination at a health care facility 02/20/2011   MENINGIOMA 11/17/2009   Osteoporosis 03/19/2007   HYPOGLYCEMIA 06/12/2006   Essential hypertension 06/12/2006   Allergic rhinitis 06/12/2006   GERD 06/12/2006   Past Medical History:  Diagnosis Date   Anemia    Arthritis    in hips   GERD (gastroesophageal reflux disease)    History of hypothyroidism    no current meds   HTN (hypertension)    echo (8/11): EF 55%, mild MR, mild TR   Nasal pruritis    full body w/o rash    Neuromuscular disorder (HCC)    cramps occ after working out   Nonallergic rhinitis    Osteoporosis    Psoriasis    mild intermittent    Tingling    in hands of ? etiol   Past Surgical History:  Procedure Laterality Date   2D echo  10/2009   nml    ABDOMINAL HYSTERECTOMY  1975   with partial 1 ovary removed   ABDOMINAL SURGERY  1976   adhesions   CESAREAN SECTION  1971   COLONOSCOPY  2006  dexa - osteopenia  2005   dexa 2009 - osteoporosis/fairly stable    hosp TIA  10/2009   no problems since, told in hospital not tia   NASAL SINUS SURGERY  2002   OOPHORECTOMY     ORIF FINGER / THUMB FRACTURE Right 02/2005   ovary removed  1976   removal of internal stitch that did not disolve  1977   RETINAL DETACHMENT SURGERY Left 10/25/2020   11/25/20   TRANSANAL HEMORRHOIDAL DEARTERIALIZATION N/A 08/22/2019   Procedure: TRANSANAL HEMORRHOIDAL DEARTERIALIZATION;  Surgeon: Romie Levee, MD;  Location: [facility];  Service: General;  Laterality: N/A;   urethral stricutre  1989 or 1990   dilated   VESICOVAGINAL FISTULA CLOSURE W/ TAH  1975   Social History   Tobacco Use   Smoking status: Former    Years: 5    Types: Cigarettes    Quit date: 04/08/1987    Years since quitting: 35.3   Smokeless tobacco: Never   Tobacco comments:    1-2 cig per day  Vaping Use   Vaping Use: Never used  Substance Use Topics   Alcohol  use: Yes    Comment: occasional   Drug use: No   Family History  Problem Relation Age of Onset   Cancer Father        throat CA   Diabetes Father    Heart disease Mother        MI late 59s - CHF    Hypertension Mother    Stroke Brother    Diabetes Brother    Heart disease Sister        CHF   Diabetes Sister    Hypertension Brother    Diabetes Brother    Allergies  Allergen Reactions   Amlodipine Swelling    Ankle swelling   Azithromycin     Heart palpitations on z pack   Levaquin [Levofloxacin In D5w] Other (See Comments)    Muscle pain    Losartan     The 100 mg pill caused some back pain   (? Also a little dizziness and possibly headache)   Penicillins     REACTION: urticaria (hives), knots in abdomen easy bruising   Shrimp [Shellfish Allergy]     Throat tries to close   Sulfonamide Derivatives     REACTION: tingling around mouth and hands felt tight   Povidone-Iodine Hives and Itching   Current Outpatient Medications on File Prior to Visit  Medication Sig Dispense Refill   amLODipine (NORVASC) 5 MG tablet TAKE 1 TABLET BY MOUTH DAILY 80 tablet 3   Cholecalciferol (VITAMIN D3) 1000 UNITS CAPS Take 1 capsule by mouth daily.     cyclobenzaprine (FLEXERIL) 10 MG tablet TAKE 1/2-1 TABLET BY MOUTH 3 TIMES DAILY AS NEEDED FOR MUSCLE SPASMS (WATCH FOR SEDATION) 30 tablet 3   Ferrous Sulfate 250 MG CPCR Take 1 capsule by mouth daily.     fish oil-omega-3 fatty acids 1000 MG capsule Take 2 g by mouth daily. Alternate with flax oil  500 mg in am     fluticasone (FLONASE) 50 MCG/ACT nasal spray USE 2 SPRAYS IN BOTH NOSTRILS  DAILY 48 g 2   hydrochlorothiazide (HYDRODIURIL) 25 MG tablet TAKE 1 TABLET BY MOUTH DAILY 80 tablet 3   levocetirizine (XYZAL) 5 MG tablet TAKE 1 TABLET BY MOUTH DAILY AS  NEEDED 90 tablet 2   magnesium 30 MG tablet Take 30 mg by mouth daily.     metoprolol  succinate (TOPROL-XL) 100 MG 24 hr tablet TAKE 1 TABLET BY MOUTH ONCE  DAILY WITH A MEAL OR  IMMEDIATELY FOLLOWING A MEAL 80 tablet 3   Multiple Vitamin (MULTIVITAMIN) tablet Take 1 tablet by mouth daily.     NON FORMULARY similisan dry eye drops daily in am     No current facility-administered medications on file prior to visit.    Review of Systems  Constitutional:  Negative for activity change, appetite change, fatigue, fever and unexpected weight change.  HENT:  Negative for congestion, ear pain, rhinorrhea, sinus pressure and sore throat.   Eyes:  Negative for pain, redness and visual disturbance.  Respiratory:  Negative for cough, shortness of breath and wheezing.   Cardiovascular:  Negative for chest pain and palpitations.  Gastrointestinal:  Negative for abdominal pain, blood in stool, constipation and diarrhea.  Endocrine: Negative for polydipsia and polyuria.  Genitourinary:  Negative for dysuria, frequency and urgency.  Musculoskeletal:  Negative for arthralgias, back pain and myalgias.       Previous back pain is resolved   Skin:  Negative for pallor and rash.  Allergic/Immunologic: Negative for environmental allergies.  Neurological:  Negative for dizziness, syncope and headaches.       Headache is gone now off losartan  Hematological:  Negative for adenopathy. Does not bruise/bleed easily.  Psychiatric/Behavioral:  Negative for decreased concentration and dysphoric mood. The patient is not nervous/anxious.        Objective:   Physical Exam Constitutional:      General: She is not in acute distress.    Appearance: Normal appearance. She is well-developed and normal weight. She is not ill-appearing or diaphoretic.  HENT:     Head: Normocephalic and atraumatic.  Eyes:     Conjunctiva/sclera: Conjunctivae normal.     Pupils: Pupils are equal, round, and reactive to light.  Neck:     Thyroid: No thyromegaly.     Vascular: No carotid bruit or JVD.  Cardiovascular:     Rate and Rhythm: Normal rate and regular rhythm.     Heart sounds: Normal heart sounds.      No gallop.  Pulmonary:     Effort: Pulmonary effort is normal. No respiratory distress.     Breath sounds: Normal breath sounds. No wheezing or rales.  Abdominal:     General: There is no distension or abdominal bruit.     Palpations: Abdomen is soft.  Musculoskeletal:     Cervical back: Normal range of motion and neck supple.     Right lower leg: No edema.     Left lower leg: No edema.  Lymphadenopathy:     Cervical: No cervical adenopathy.  Skin:    General: Skin is warm and dry.     Coloration: Skin is not pale.     Findings: No rash.  Neurological:     Mental Status: She is alert.     Cranial Nerves: No cranial nerve deficit.     Motor: No weakness.     Coordination: Coordination normal.     Gait: Gait normal.     Deep Tendon Reflexes: Reflexes are normal and symmetric. Reflexes normal.  Psychiatric:        Mood and Affect: Mood normal.           Assessment & Plan:   Problem List Items Addressed This Visit       Cardiovascular and Mediastinum   Essential hypertension - Primary    BP: 134/84  Pt is  not tolerating losartan  (100 mg causes back pain/dizziness and HA)  Continues Hctz 25 mg daily  Amlodipine 5 mg daily  Normal exam  Reviewed health habits (good) and last cmp   Will continue to monitor here and at home         Nervous and Auditory   MENINGIOMA    Had headache with losartan but resolved now off of it  No clinical changes

## 2022-08-23 ENCOUNTER — Ambulatory Visit: Payer: Medicare Other | Admitting: Dermatology

## 2022-08-29 DIAGNOSIS — H30042 Focal chorioretinal inflammation, macular or paramacular, left eye: Secondary | ICD-10-CM | POA: Diagnosis not present

## 2022-08-31 DIAGNOSIS — E0590 Thyrotoxicosis, unspecified without thyrotoxic crisis or storm: Secondary | ICD-10-CM | POA: Diagnosis not present

## 2022-09-14 ENCOUNTER — Other Ambulatory Visit: Payer: Self-pay | Admitting: Family Medicine

## 2022-09-26 ENCOUNTER — Ambulatory Visit (INDEPENDENT_AMBULATORY_CARE_PROVIDER_SITE_OTHER)
Admission: RE | Admit: 2022-09-26 | Discharge: 2022-09-26 | Disposition: A | Discharge status: Home / Self Care | Payer: Medicare Other | Source: Ambulatory Visit | Attending: Family Medicine | Admitting: Family Medicine

## 2022-09-26 ENCOUNTER — Ambulatory Visit: Payer: Medicare Other | Admitting: Family Medicine

## 2022-09-26 ENCOUNTER — Encounter: Payer: Self-pay | Admitting: Family Medicine

## 2022-09-26 VITALS — BP 136/74 | HR 57 | Temp 98.0°F | Ht 65.5 in | Wt 158.0 lb

## 2022-09-26 DIAGNOSIS — R0789 Other chest pain: Secondary | ICD-10-CM

## 2022-09-26 DIAGNOSIS — I10 Essential (primary) hypertension: Secondary | ICD-10-CM

## 2022-09-26 DIAGNOSIS — Z8249 Family history of ischemic heart disease and other diseases of the circulatory system: Secondary | ICD-10-CM | POA: Diagnosis not present

## 2022-09-26 NOTE — Assessment & Plan Note (Addendum)
2 episodes of non exertional chest discomfort on left w/o any other associated symtoms  Also HTN  Good lipid profile in nov Strong fam history of CAD  Referred to cardiology to discuss possible further diag testing  Cxr ordered   Call back and Er precautions noted in detail today   Reassuring exam and vitals and EKG

## 2022-09-26 NOTE — Patient Instructions (Signed)
I put the referral in for cardiology  Please let us know if you don't hear in 1-2 weeks    Chest xray today - we will contact you with result when read (7-10 days)  Exam and EKG are reassuring   If symptoms suddenly worsen and persist go to the ER    Take care of yourself  Eat healthy  Keep up the good exercise

## 2022-09-26 NOTE — Assessment & Plan Note (Signed)
bp in fair control at this time  BP Readings from Last 1 Encounters:  09/26/22 136/74   No changes needed Most recent labs reviewed  Disc lifstyle change with low sodium diet and exercise  Continues amlodipine 5 mg daily  Hydrochlorothiazide 25 mg daily  Metoprolol xl  100 mg daily   No longer has pedal edema

## 2022-09-26 NOTE — Assessment & Plan Note (Signed)
Mother and sister and niece  Mother in 14s  Sister stents in 88s

## 2022-09-26 NOTE — Progress Notes (Signed)
Subjective:    Patient ID: Susan Woods, female    DOB: [birth year], 77 y.o.   MRN: [id number]  HPI  Wt Readings from Last 3 Encounters:  09/26/22 158 lb (71.7 kg)  08/18/22 159 lb 2 oz (72.2 kg)  05/24/22 158 lb 2 oz (71.7 kg)   25.89 kg/m  Vitals:   09/26/22 1221  BP: 136/74  Pulse: (!) 57  Temp: 98 F (36.7 C)  SpO2: 96%   Pt presents with c/o chest discomfort   Twice in past 4 months   Starts on left and feels heavy - lasts less than 5 minutes  First time happened when brushing her teeth  2nd time she was lying down in bed   No more stress than usual   Exercises regularly - quite a bit  Water exercise also   Smoked in the distant past  1-2 cig per day when in school  Stopped before 1989       Some worries about family history  Mother with CAD in 29s  Sister had a cath and found blockages and had stents (history of high blood pressure , ? If high  She is 77  cholesterol and does not smoke)  Brother has aneurysm in chest  Niece with stents         Pt has history of HTN and also hypertensive eye disease  bp is stable today  No cp or palpitations or headaches or edema  No side effects to medicines  BP Readings from Last 3 Encounters:  09/26/22 136/74  08/18/22 134/84  05/24/22 131/80     Pulse Readings from Last 3 Encounters:  09/26/22 (!) 57  08/18/22 66  05/24/22 61   Hctz 25 mg daily  Amlodipine 5 mg daily  Metoprolol xl 100 mg daily   Lab Results  Component Value Date   NA 142 01/30/2022   K 4.0 01/30/2022   CO2 30 01/30/2022   GLUCOSE 105 (H) 01/30/2022   BUN 13 01/30/2022   CREATININE 0.81 01/30/2022   CALCIUM 9.4 01/30/2022   GFR 71.56 01/30/2022   GFRNONAA >60 10/28/2016   Cholesterol Lab Results  Component Value Date   CHOL 149 01/30/2022   HDL 50.80 01/30/2022   LDLCALC 82 01/30/2022   TRIG 81.0 01/30/2022   CHOLHDL 3 01/30/2022    EKG today Sinus bradycardia rateof 56  No ST or T  changes      Patient Active Problem List   Diagnosis Date Noted   Family history of heart disease 09/26/2022   Low TSH level 01/31/2022   Skin lesion of face 07/11/2021   Vitamin D deficiency 01/27/2021   Rectal bleeding 04/21/2019   Internal hemorrhoids 04/27/2016   Hypothyroid 01/13/2016   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   Colon cancer screening 12/22/2014   Hypertensive retinopathy of both eyes, grade 1 12/16/2013   Encounter for Medicare annual wellness exam 12/31/2012   Routine general medical examination at a health care facility 02/20/2011   Chest discomfort 05/16/2010   MENINGIOMA 11/17/2009   Osteoporosis 03/19/2007   HYPOGLYCEMIA 06/12/2006   Essential hypertension 06/12/2006   Allergic rhinitis 06/12/2006   GERD 06/12/2006   Past Medical History:  Diagnosis Date   Anemia    Arthritis    in hips   GERD (gastroesophageal reflux disease)    History of hypothyroidism    no current meds   HTN (hypertension)    echo (8/11): EF 55%, mild MR, mild TR   Nasal pruritis  full body w/o rash    Neuromuscular disorder (HCC)    cramps occ after working out   Nonallergic rhinitis    Osteoporosis    Psoriasis    mild intermittent    Tingling    in hands of ? etiol   Past Surgical History:  Procedure Laterality Date   2D echo  10/2009   nml    ABDOMINAL HYSTERECTOMY  1975   with partial 1 ovary removed   ABDOMINAL SURGERY  1976   adhesions   CESAREAN SECTION  1971   COLONOSCOPY  2006   dexa - osteopenia  2005   dexa 2009 - osteoporosis/fairly stable    hosp TIA  10/2009   no problems since, told in hospital not tia   NASAL SINUS SURGERY  2002   OOPHORECTOMY     ORIF FINGER / THUMB FRACTURE Right 02/2005   ovary removed  1976   removal of internal stitch that did not disolve  1977   RETINAL DETACHMENT SURGERY Left 10/25/2020   11/25/20   TRANSANAL HEMORRHOIDAL DEARTERIALIZATION N/A 08/22/2019   Procedure: TRANSANAL HEMORRHOIDAL DEARTERIALIZATION;  Surgeon:  Romie Levee, MD;  Location: [facility] [place];  Service: General;  Laterality: N/A;   urethral stricutre  1989 or 1990   dilated   VESICOVAGINAL FISTULA CLOSURE W/ TAH  1975   Social History   Tobacco Use   Smoking status: Former    Current packs/day: 0.00    Types: Cigarettes    Start date: 04/07/1982    Quit date: 04/08/1987    Years since quitting: 35.4   Smokeless tobacco: Never   Tobacco comments:    1-2 cig per day  Vaping Use   Vaping status: Never Used  Substance Use Topics   Alcohol use: Yes    Comment: occasional   Drug use: No   Family History  Problem Relation Age of Onset   Cancer Father        throat CA   Diabetes Father    Heart disease Mother        MI late 32s - CHF    Hypertension Mother    Stroke Brother    Diabetes Brother    Heart disease Sister        CHF   Diabetes Sister    Hypertension Brother    Diabetes Brother    Allergies  Allergen Reactions   Amlodipine Swelling    Ankle swelling   Azithromycin     Heart palpitations on z pack   Levaquin [Levofloxacin In D5w] Other (See Comments)    Muscle pain    Losartan     The 100 mg pill caused some back pain   (? Also a little dizziness and possibly headache)   Penicillins     REACTION: urticaria (hives), knots in abdomen easy bruising   Shrimp [Shellfish Allergy]     Throat tries to close   Sulfonamide Derivatives     REACTION: tingling around mouth and hands felt tight   Povidone-Iodine Hives and Itching   Current Outpatient Medications on File Prior to Visit  Medication Sig Dispense Refill   amLODipine (NORVASC) 5 MG tablet TAKE 1 TABLET BY MOUTH DAILY 90 tablet 1   Cholecalciferol (VITAMIN D3) 1000 UNITS CAPS Take 1 capsule by mouth daily.     cyclobenzaprine (FLEXERIL) 10 MG tablet TAKE 1/2-1 TABLET BY MOUTH 3 TIMES DAILY AS NEEDED FOR MUSCLE SPASMS (WATCH FOR SEDATION) 30 tablet 3   Ferrous Sulfate  250 MG CPCR Take 1 capsule by mouth daily.     fish oil-omega-3 fatty  acids 1000 MG capsule Take 2 g by mouth daily. Alternate with flax oil  500 mg in am     fluticasone (FLONASE) 50 MCG/ACT nasal spray USE 2 SPRAYS IN BOTH NOSTRILS  DAILY 48 g 1   hydrochlorothiazide (HYDRODIURIL) 25 MG tablet TAKE 1 TABLET BY MOUTH DAILY 90 tablet 1   levocetirizine (XYZAL) 5 MG tablet TAKE 1 TABLET BY MOUTH DAILY AS  NEEDED 90 tablet 1   magnesium 30 MG tablet Take 30 mg by mouth daily.     metoprolol succinate (TOPROL-XL) 100 MG 24 hr tablet TAKE 1 TABLET BY MOUTH ONCE  DAILY WITH A MEAL OR IMMEDIATELY FOLLOWING A MEAL 90 tablet 1   Multiple Vitamin (MULTIVITAMIN) tablet Take 1 tablet by mouth daily.     NON FORMULARY similisan dry eye drops daily in am     No current facility-administered medications on file prior to visit.    Review of Systems  Constitutional:  Negative for activity change, appetite change, fatigue, fever and unexpected weight change.  HENT:  Negative for congestion, ear pain, rhinorrhea, sinus pressure and sore throat.   Eyes:  Negative for pain, redness and visual disturbance.  Respiratory:  Negative for cough, shortness of breath and wheezing.   Cardiovascular:  Positive for chest pain. Negative for palpitations and leg swelling.  Gastrointestinal:  Negative for abdominal pain, blood in stool, constipation and diarrhea.  Endocrine: Negative for polydipsia and polyuria.  Genitourinary:  Negative for dysuria, frequency and urgency.  Musculoskeletal:  Negative for arthralgias, back pain and myalgias.  Skin:  Negative for pallor and rash.  Allergic/Immunologic: Negative for environmental allergies.  Neurological:  Negative for dizziness, syncope and headaches.  Hematological:  Negative for adenopathy. Does not bruise/bleed easily.  Psychiatric/Behavioral:  Negative for decreased concentration and dysphoric mood. The patient is not nervous/anxious.        Objective:   Physical Exam Constitutional:      General: She is not in acute distress.     Appearance: She is well-developed and normal weight. She is not ill-appearing or diaphoretic.  HENT:     Head: Normocephalic and atraumatic.     Mouth/Throat:     Mouth: Mucous membranes are moist.  Eyes:     General:        Right eye: No discharge.        Left eye: No discharge.     Conjunctiva/sclera: Conjunctivae normal.     Pupils: Pupils are equal, round, and reactive to light.  Neck:     Thyroid: No thyromegaly.     Vascular: No carotid bruit or JVD.  Cardiovascular:     Rate and Rhythm: Regular rhythm. Bradycardia present.     Heart sounds: Normal heart sounds.     No gallop.  Pulmonary:     Effort: Pulmonary effort is normal. No respiratory distress.     Breath sounds: Normal breath sounds. No stridor. No wheezing, rhonchi or rales.     Comments: No crackles Chest:     Chest wall: No tenderness.  Abdominal:     General: There is no distension or abdominal bruit.     Palpations: Abdomen is soft.  Musculoskeletal:     Cervical back: Normal range of motion and neck supple.     Right lower leg: No edema.     Left lower leg: No edema.  Lymphadenopathy:  Cervical: No cervical adenopathy.  Skin:    General: Skin is warm and dry.     Coloration: Skin is not pale.     Findings: No rash.  Neurological:     Mental Status: She is alert.     Cranial Nerves: No cranial nerve deficit.     Motor: No weakness.     Coordination: Coordination normal.     Deep Tendon Reflexes: Reflexes are normal and symmetric. Reflexes normal.  Psychiatric:        Mood and Affect: Mood normal.           Assessment & Plan:   Problem List Items Addressed This Visit       Cardiovascular and Mediastinum   Essential hypertension    bp in fair control at this time  BP Readings from Last 1 Encounters:  09/26/22 136/74   No changes needed Most recent labs reviewed  Disc lifstyle change with low sodium diet and exercise  Continues amlodipine 5 mg daily  Hydrochlorothiazide 25 mg  daily  Metoprolol xl  100 mg daily   No longer has pedal edema         Other   Family history of heart disease    Mother and sister and niece  Mother in 65s  Sister stents in 38s         Relevant Orders   Ambulatory referral to Cardiology   Chest discomfort - Primary    2 episodes of non exertional chest discomfort on left w/o any other associated symtoms  Also HTN  Good lipid profile in nov Strong fam history of CAD  Referred to cardiology to discuss possible further diag testing  Cxr ordered   Call back and Er precautions noted in detail today   Reassuring exam and vitals and EKG      Relevant Orders   EKG 12-Lead (Completed)   DG Chest 2 View   Ambulatory referral to Cardiology

## 2022-10-05 ENCOUNTER — Other Ambulatory Visit
Admission: RE | Admit: 2022-10-05 | Discharge: 2022-10-05 | Disposition: A | Discharge status: Home / Self Care | Payer: Medicare Other | Source: Ambulatory Visit | Attending: Cardiovascular Disease | Admitting: Cardiovascular Disease

## 2022-10-05 ENCOUNTER — Ambulatory Visit: Payer: Medicare Other | Attending: Cardiovascular Disease | Admitting: Cardiovascular Disease

## 2022-10-05 ENCOUNTER — Encounter: Payer: Self-pay | Admitting: Cardiovascular Disease

## 2022-10-05 VITALS — BP 124/80 | HR 57 | Ht 65.5 in | Wt 162.4 lb

## 2022-10-05 DIAGNOSIS — R0789 Other chest pain: Secondary | ICD-10-CM

## 2022-10-05 DIAGNOSIS — R072 Precordial pain: Secondary | ICD-10-CM | POA: Diagnosis not present

## 2022-10-05 DIAGNOSIS — I10 Essential (primary) hypertension: Secondary | ICD-10-CM | POA: Diagnosis not present

## 2022-10-05 LAB — BASIC METABOLIC PANEL
Anion gap: 7 (ref 5–15)
BUN: 14 mg/dL (ref 8–23)
CO2: 25 mmol/L (ref 22–32)
Calcium: 9 mg/dL (ref 8.9–10.3)
Chloride: 104 mmol/L (ref 98–111)
Creatinine, Ser: 0.74 mg/dL (ref 0.44–1.00)
GFR, Estimated: >60 mL/min (ref >60)
Glucose, Bld: 80 mg/dL (ref 70–99)
Potassium: 3.7 mmol/L (ref 3.5–5.1)
Sodium: 136 mmol/L (ref 135–145)

## 2022-10-05 MED ORDER — PREDNISONE 50 MG PO TABS: ORAL_TABLET | ORAL | 0 refills | Status: DC

## 2022-10-05 MED ORDER — DIPHENHYDRAMINE HCL 50 MG PO TABS: ORAL_TABLET | ORAL | 0 refills | Status: DC

## 2022-10-05 NOTE — Patient Instructions (Addendum)
Medication Instructions:  No changes *If you need a refill on your cardiac medications before your next appointment, please call your pharmacy*   Lab Work: Your provider would like for you to have following labs drawn today BMET.   If you have labs (blood work) drawn today and your tests are completely normal, you will receive your results only by: MyChart Message (if you have MyChart) OR A paper copy in the mail If you have any lab test that is abnormal or we need to change your treatment, we will call you to review the results.  Follow-Up: At [facility], you and your health needs are our priority.  As part of our continuing mission to provide you with exceptional heart care, we have created designated Provider Care Teams.  These Care Teams include your primary Cardiologist (physician) and Advanced Practice Providers (APPs -  Physician Assistants and Nurse Practitioners) who all work together to provide you with the care you need, when you need it.  We recommend signing up for the patient portal called "MyChart".  Sign up information is provided on this After Visit Summary.  MyChart is used to connect with patients for Virtual Visits (Telemedicine).  Patients are able to view lab/test results, encounter notes, upcoming appointments, etc.  Non-urgent messages can be sent to your provider as well.   To learn more about what you can do with MyChart, go to [email or web address].    Your next appointment:   Follow up as needed with Dr. Kirke Corin Other Instructions   Your cardiac CT will be scheduled at one of the below locations:   [facility] [address] [place], [place] [address] [phone]  OR  [facility] [address] [place], [place] [address] [phone]  OR   [facility] [address] [place], [place] [address] [phone]  If scheduled at [facility], please  arrive at the [facility] and Children's Entrance (Entrance C2) of [facility] 30 minutes prior to test start time. You can use the FREE valet parking offered at entrance C (encouraged to control the heart rate for the test)  Proceed to the [facility] Radiology Department (first floor) to check-in and test prep.  All radiology patients and guests should use entrance C2 at [facility], accessed from [facility], even though the hospital's physical address listed is [address]    If scheduled at [facility] or [facility], please arrive 15 mins early for check-in and test prep.  There is spacious parking and easy access to the radiology department from the [facility] entrance. Please enter here and check-in with the desk attendant.   Please follow these instructions carefully (unless otherwise directed):  An IV will be required for this test and Nitroglycerin will be given.    On the Night Before the Test: Be sure to Drink plenty of water. Do not consume any caffeinated/decaffeinated beverages or chocolate 12 hours prior to your test. Do not take any antihistamines 12 hours prior to your test. If the patient has contrast allergy: Patient will need a prescription for Prednisone and very clear instructions (as follows): Prednisone 50 mg - take 13 hours prior to test Take another Prednisone 50 mg 7 hours prior to test Take another Prednisone 50 mg 1 hour prior to test Take Benadryl 50 mg 1 hour prior to test Patient must complete all four  doses of above prophylactic medications. Patient will need a ride after test due to Benadryl.  On the Day of the Test: Drink plenty of water until 1 hour prior to the test. Do not eat any food 1 hour prior to test. You may take your regular medications prior to the test.  Take metoprolol two hours prior to test. If you take Hydrochlorothiazide, please HOLD  on the morning of the test. FEMALES- please wear underwire-free bra if available, avoid dresses & tight clothing  After the Test: Drink plenty of water. After receiving IV contrast, you may experience a mild flushed feeling. This is normal. On occasion, you may experience a mild rash up to 24 hours after the test. This is not dangerous. If this occurs, you can take Benadryl 25 mg and increase your fluid intake. If you experience trouble breathing, this can be serious. If it is severe call 911 IMMEDIATELY. If it is mild, please call our office. If you take any of these medications: Glipizide/Metformin, Avandament, Glucavance, please do not take 48 hours after completing test unless otherwise instructed.  We will call to schedule your test 2-4 weeks out understanding that some insurance companies will need an authorization prior to the service being performed.   For more information and frequently asked questions, please visit our website : [email or web address]  For non-scheduling related questions, please contact the cardiac imaging nurse navigator should you have any questions/concerns: Cardiac Imaging Nurse Navigators Direct Office Dial: [phone]   For scheduling needs, including cancellations and rescheduling, please call [place], [phone].

## 2022-10-05 NOTE — Progress Notes (Signed)
Cardiology Office Note   Date:  10/05/2022   ID:  Susan Woods, Susan Woods 04, 1947, MRN [id number]  PCP:  Judy Pimple, MD  Cardiologist:   Lorine Bears, MD   Chief Complaint  Patient presents with   New Patient (Initial Visit)    Chest discomfort/Fam Hx CAD. Meds reviewed verbally with pt.      History of Present Illness: Susan Woods is a 77 y.o. female who was referred for evaluation of chest pain.  She has no prior cardiac history.  She has known history of essential hypertension, GERD, psoriasis and anemia.  She is a lifelong non-smoker.  She reports family history of premature coronary artery disease as her sister had PCI done and her niece had PCI as well. She reports intermittent episodes of chest pain described as heaviness feeling that substernal with no radiation.  The first episode happened when she was brushing her teeth.  She had another episode that happened.  She had intermittent episodes of chest tightness since that time mostly at rest and very rarely with exercise.  She tries to exercise on regular basis and she is overall active.    Past Medical History:  Diagnosis Date   Anemia    Arthritis    in hips   GERD (gastroesophageal reflux disease)    History of hypothyroidism    no current meds   HTN (hypertension)    echo (8/11): EF 55%, mild MR, mild TR   Nasal pruritis    full body w/o rash    Neuromuscular disorder (HCC)    cramps occ after working out   Nonallergic rhinitis    Osteoporosis    Psoriasis    mild intermittent    Thyroid disease    Tingling    in hands of ? etiol    Past Surgical History:  Procedure Laterality Date   2D echo  10/2009   nml    ABDOMINAL HYSTERECTOMY  1975   with partial 1 ovary removed   ABDOMINAL SURGERY  1976   adhesions   CESAREAN SECTION  1971   COLONOSCOPY  2006   dexa - osteopenia  2005   dexa 2009 - osteoporosis/fairly stable    hosp TIA  10/2009   no problems since, told in  hospital not tia   NASAL SINUS SURGERY  2002   OOPHORECTOMY     ORIF FINGER / THUMB FRACTURE Right 02/2005   ovary removed  1976   removal of internal stitch that did not disolve  1977   RETINAL DETACHMENT SURGERY Left 10/25/2020   11/25/20   TRANSANAL HEMORRHOIDAL DEARTERIALIZATION N/A 08/22/2019   Procedure: TRANSANAL HEMORRHOIDAL DEARTERIALIZATION;  Surgeon: Romie Levee, MD;  Location: [facility];  Service: General;  Laterality: N/A;   urethral stricutre  1989 or 1990   dilated   VESICOVAGINAL FISTULA CLOSURE W/ TAH  1975     Current Outpatient Medications  Medication Sig Dispense Refill   amLODipine (NORVASC) 5 MG tablet TAKE 1 TABLET BY MOUTH DAILY 90 tablet 1   Cholecalciferol (VITAMIN D3) 1000 UNITS CAPS Take 1 capsule by mouth daily.     cyclobenzaprine (FLEXERIL) 10 MG tablet TAKE 1/2-1 TABLET BY MOUTH 3 TIMES DAILY AS NEEDED FOR MUSCLE SPASMS (WATCH FOR SEDATION) 30 tablet 3   Ferrous Sulfate 250 MG CPCR Take 1 capsule by mouth daily.     fish oil-omega-3 fatty acids 1000 MG capsule Take 2 g by mouth daily. Alternate with flax  oil  500 mg in am     fluticasone (FLONASE) 50 MCG/ACT nasal spray USE 2 SPRAYS IN BOTH NOSTRILS  DAILY 48 g 1   hydrochlorothiazide (HYDRODIURIL) 25 MG tablet TAKE 1 TABLET BY MOUTH DAILY 90 tablet 1   levocetirizine (XYZAL) 5 MG tablet TAKE 1 TABLET BY MOUTH DAILY AS  NEEDED 90 tablet 1   magnesium 30 MG tablet Take 30 mg by mouth daily.     metoprolol succinate (TOPROL-XL) 100 MG 24 hr tablet TAKE 1 TABLET BY MOUTH ONCE  DAILY WITH A MEAL OR IMMEDIATELY FOLLOWING A MEAL 90 tablet 1   Multiple Vitamin (MULTIVITAMIN) tablet Take 1 tablet by mouth daily.     NON FORMULARY similisan dry eye drops daily in am     No current facility-administered medications for this visit.    Allergies:   Amlodipine, Azithromycin, Levaquin [levofloxacin in d5w], Losartan, Penicillins, Shrimp [shellfish allergy], Sulfonamide derivatives, and  Povidone-iodine    Social History:  The patient  reports that she quit smoking about 35 years ago. Her smoking use included cigarettes. She started smoking about 40 years ago. She has never used smokeless tobacco. She reports current alcohol use. She reports that she does not use drugs.   Family History:  The patient's family history includes Cancer in her father; Diabetes in her brother, brother, father, and sister; Heart disease in her mother and sister; Hypertension in her brother and mother; Stroke in her brother.    ROS:  Please see the history of present illness.   Otherwise, review of systems are positive for none.   All other systems are reviewed and negative.    PHYSICAL EXAM: VS:  BP 124/80 (BP Location: Right Arm, Patient Position: Sitting, Cuff Size: Normal)   Pulse (!) 57   Ht 5' 5.5" (1.664 m)   Wt 162 lb 6 oz (73.7 kg)   SpO2 96%   BMI 26.61 kg/m  , BMI Body mass index is 26.61 kg/m. GEN: Well nourished, well developed, in no acute distress  HEENT: normal  Neck: no JVD, carotid bruits, or masses Cardiac: RRR; no  rubs, or gallops,no edema .  1/6 systolic murmur in the aortic area Respiratory:  clear to auscultation bilaterally, normal work of breathing GI: soft, nontender, nondistended, + BS MS: no deformity or atrophy  Skin: warm and dry, no rash Neuro:  Strength and sensation are intact Psych: euthymic mood, full affect   EKG:  EKG is ordered today. The ekg ordered today demonstrates : Sinus bradycardia When compared with ECG of 22-Aug-2019 06:14, No significant change was found    Recent Labs: 01/30/2022: ALT 8; BUN 13; Creatinine, Ser 0.81; Hemoglobin 12.6; Platelets 246.0; Potassium 4.0; Sodium 142; TSH 0.16    Lipid Panel    Component Value Date/Time   CHOL 149 01/30/2022 1017   TRIG 81.0 01/30/2022 1017   HDL 50.80 01/30/2022 1017   CHOLHDL 3 01/30/2022 1017   VLDL 16.2 01/30/2022 1017   LDLCALC 82 01/30/2022 1017      Wt Readings from Last  3 Encounters:  10/05/22 162 lb 6 oz (73.7 kg)  09/26/22 158 lb (71.7 kg)  08/18/22 159 lb 2 oz (72.2 kg)          10/05/2022    4:17 PM  PAD Screen  Previous PAD dx? No  Previous surgical procedure? No  Pain with walking? No  Feet/toe relief with dangling? No  Painful, non-healing ulcers? No  Extremities discolored? No  ASSESSMENT AND PLAN:  1.  Chest pain with some anginal and some atypical features: She family history history of coronary artery disease.  I recommend evaluation with cardiac CTA.  2.  Essential hypertension: Blood pressure is controlled on current medications.  3.  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    Disposition:   FU as needed if cardiac CTA is abnormal.  Signed,  Lorine Bears, MD  10/05/2022 4:27 PM    [facility]

## 2022-10-18 DIAGNOSIS — E0590 Thyrotoxicosis, unspecified without thyrotoxic crisis or storm: Secondary | ICD-10-CM | POA: Diagnosis not present

## 2022-10-25 ENCOUNTER — Telehealth: Payer: Self-pay | Admitting: Cardiovascular Disease

## 2022-10-25 ENCOUNTER — Telehealth (HOSPITAL_COMMUNITY): Payer: Self-pay | Admitting: *Deleted

## 2022-10-25 NOTE — Telephone Encounter (Signed)
Attempted to call patient regarding upcoming cardiac CT appointment. °Left message on voicemail with name and callback number ° °Merle Prescott RN Navigator Cardiac Imaging °[facility] °[phone] Office °[phone] Cell ° °

## 2022-10-25 NOTE — Telephone Encounter (Signed)
Patient is calling because she just received a call from the imaging facility. Patient states she was told she wouldn't need to take the benadryl for this test. Patient is allegoric to the Iodine that is typically used and was told by Dr. Kirke Corin that she needs to take this medication before her test. Patient states the facility told her the Iodine they use is not the same as the one she is allergic to. Patient is very concerned and is unsure if she should take the medication. Please advise.

## 2022-10-25 NOTE — Telephone Encounter (Signed)
Spoke with patient and informed her that the radiologist technician was correct that the iodine used in contrast media is not the same iodine (povidone) that the patient is allergic to however she should still follow the instructions for the prophylactic iodine allergy protocol as follows:   Patient will need a prescription for Prednisone and very clear instructions (as follows): Prednisone 50 mg - take 13 hours prior to test Take another Prednisone 50 mg 7 hours prior to test Take another Prednisone 50 mg 1 hour prior to test Take Benadryl 50 mg 1 hour prior to test Patient must complete all four doses of above prophylactic medications. Patient will need a ride after test due to Benadryl.  After the Test: Drink plenty of water. After receiving IV contrast, you may experience a mild flushed feeling. This is normal. On occasion, you may experience a mild rash up to 24 hours after the test. This is not dangerous. If this occurs, you can take Benadryl 25 mg and increase your fluid intake. If you experience trouble breathing, this can be serious. If it is severe call 911 IMMEDIATELY. If it is mild, please call our office.  Patient understood with read back as she has hard copy instructions

## 2022-10-26 ENCOUNTER — Ambulatory Visit: Admission: RE | Admit: 2022-10-26 | Payer: Medicare Other | Source: Ambulatory Visit

## 2022-10-26 ENCOUNTER — Ambulatory Visit
Admission: RE | Admit: 2022-10-26 | Discharge: 2022-10-26 | Disposition: A | Discharge status: Home / Self Care | Payer: Medicare Other | Source: Ambulatory Visit | Attending: Cardiovascular Disease | Admitting: Cardiovascular Disease

## 2022-10-26 ENCOUNTER — Other Ambulatory Visit: Payer: Medicare Other

## 2022-10-26 DIAGNOSIS — R072 Precordial pain: Secondary | ICD-10-CM | POA: Diagnosis not present

## 2022-10-26 MED ORDER — IOHEXOL 350 MG/ML SOLN
80.0000 mL | Freq: Once | INTRAVENOUS | Status: AC | PRN
  Administered 2022-10-26: 80 mL via INTRAVENOUS

## 2022-10-26 MED ORDER — NITROGLYCERIN 0.4 MG SL SUBL
0.8000 mg | SUBLINGUAL_TABLET | Freq: Once | SUBLINGUAL | Status: AC
  Administered 2022-10-26: 0.8 mg via SUBLINGUAL

## 2022-10-26 NOTE — Progress Notes (Signed)
Pt tolerated procedure well with no issues. Pt ABCs intact. Pt denies any complaints. Pt encouraged to drink plenty of water throughout the day. Pt ambulatory with steady gait.  

## 2022-11-14 DIAGNOSIS — H2511 Age-related nuclear cataract, right eye: Secondary | ICD-10-CM | POA: Diagnosis not present

## 2022-11-14 DIAGNOSIS — H43811 Vitreous degeneration, right eye: Secondary | ICD-10-CM | POA: Diagnosis not present

## 2022-11-14 DIAGNOSIS — H43391 Other vitreous opacities, right eye: Secondary | ICD-10-CM | POA: Diagnosis not present

## 2022-11-14 DIAGNOSIS — H30042 Focal chorioretinal inflammation, macular or paramacular, left eye: Secondary | ICD-10-CM | POA: Diagnosis not present

## 2022-11-16 ENCOUNTER — Encounter: Payer: Self-pay | Admitting: Internal Medicine

## 2022-11-16 ENCOUNTER — Ambulatory Visit (INDEPENDENT_AMBULATORY_CARE_PROVIDER_SITE_OTHER): Payer: Medicare Other | Admitting: Internal Medicine

## 2022-11-16 ENCOUNTER — Telehealth: Payer: Self-pay | Admitting: Family Medicine

## 2022-11-16 VITALS — BP 138/84 | HR 48 | Temp 97.4°F | Ht 65.5 in | Wt 161.0 lb

## 2022-11-16 DIAGNOSIS — R197 Diarrhea, unspecified: Secondary | ICD-10-CM | POA: Insufficient documentation

## 2022-11-16 NOTE — Telephone Encounter (Signed)
FYI: This call has been transferred to Access Nurse. Once the result note has been entered staff can address the message at that time.  Patient called in with the following symptoms:  Red Word: diarrhea,tingling all over body,muscle weakness  after being told she consumed apple juice that had inorganic arsanic from [employer]   Please advise at [facility] [phone]  Message is routed to Provider Pool and [facility] Triage

## 2022-11-16 NOTE — Telephone Encounter (Signed)
I did see her--refer to note

## 2022-11-16 NOTE — Telephone Encounter (Signed)
Still have not received access nurse note either electronically or faxed to [facility]. Pt was seen by Dr Alphonsus Sias 11/16/22 at 11:15 AM. Sending note to Dr Alphonsus Sias as Lorain Childes. And Benedict Needy RN team lead.

## 2022-11-16 NOTE — Progress Notes (Signed)
Subjective:    Patient ID: Susan Woods, female    DOB: 04-19-1945, 77 y.o.   MRN: [id number]  HPI Here due to an illness---worried about apple juice recall  Started with diarrhea --"real bad"--- 2-3 times a day Loose and even watery 5-6 days ago Almost incontinent due to urgency Doesn't remember any suspect foods Today hasn't gone --just some stomach grumbling  Now having some tingling at night in body--mostly trunk and abdomen Some itching chronically--takes the levocetrizine for this  Then yesterday got home and got message from Consumer affairs Recall on [place] apple juice--and she spoke to someone who suggesting getting checked Came in due to that Had 2 large bottles  Current Outpatient Medications on File Prior to Visit  Medication Sig Dispense Refill   amLODipine (NORVASC) 5 MG tablet TAKE 1 TABLET BY MOUTH DAILY 90 tablet 1   Cholecalciferol (VITAMIN D3) 1000 UNITS CAPS Take 1 capsule by mouth daily.     cyclobenzaprine (FLEXERIL) 10 MG tablet TAKE 1/2-1 TABLET BY MOUTH 3 TIMES DAILY AS NEEDED FOR MUSCLE SPASMS (WATCH FOR SEDATION) 30 tablet 3   Ferrous Sulfate 250 MG CPCR Take 1 capsule by mouth daily.     fish oil-omega-3 fatty acids 1000 MG capsule Take 2 g by mouth daily. Alternate with flax oil  500 mg in am     fluticasone (FLONASE) 50 MCG/ACT nasal spray USE 2 SPRAYS IN BOTH NOSTRILS  DAILY 48 g 1   hydrochlorothiazide (HYDRODIURIL) 25 MG tablet TAKE 1 TABLET BY MOUTH DAILY 90 tablet 1   levocetirizine (XYZAL) 5 MG tablet TAKE 1 TABLET BY MOUTH DAILY AS  NEEDED 90 tablet 1   magnesium 30 MG tablet Take 30 mg by mouth daily.     metoprolol succinate (TOPROL-XL) 100 MG 24 hr tablet TAKE 1 TABLET BY MOUTH ONCE  DAILY WITH A MEAL OR IMMEDIATELY FOLLOWING A MEAL 90 tablet 1   Multiple Vitamin (MULTIVITAMIN) tablet Take 1 tablet by mouth daily.     NON FORMULARY similisan dry eye drops daily in am     No current facility-administered medications on file  prior to visit.    Allergies  Allergen Reactions   Amlodipine Swelling    Ankle swelling   Azithromycin     Heart palpitations on z pack   Levaquin [Levofloxacin In D5w] Other (See Comments)    Muscle pain    Losartan     The 100 mg pill caused some back pain   (? Also a little dizziness and possibly headache)   Penicillins     REACTION: urticaria (hives), knots in abdomen easy bruising   Shrimp [Shellfish Allergy]     Throat tries to close   Sulfonamide Derivatives     REACTION: tingling around mouth and hands felt tight   Povidone-Iodine Hives and Itching    Past Medical History:  Diagnosis Date   Anemia    Arthritis    in hips   GERD (gastroesophageal reflux disease)    History of hypothyroidism    no current meds   HTN (hypertension)    echo (8/11): EF 55%, mild MR, mild TR   Nasal pruritis    full body w/o rash    Neuromuscular disorder (HCC)    cramps occ after working out   Nonallergic rhinitis    Osteoporosis    Psoriasis    mild intermittent    Thyroid disease    Tingling    in hands of ? etiol  Past Surgical History:  Procedure Laterality Date   2D echo  10/2009   nml    ABDOMINAL HYSTERECTOMY  1975   with partial 1 ovary removed   ABDOMINAL SURGERY  1976   adhesions   CESAREAN SECTION  1971   COLONOSCOPY  2006   dexa - osteopenia  2005   dexa 2009 - osteoporosis/fairly stable    hosp TIA  10/2009   no problems since, told in hospital not tia   NASAL SINUS SURGERY  2002   OOPHORECTOMY     ORIF FINGER / THUMB FRACTURE Right 02/2005   ovary removed  1976   removal of internal stitch that did not disolve  1977   RETINAL DETACHMENT SURGERY Left 10/25/2020   11/25/20   TRANSANAL HEMORRHOIDAL DEARTERIALIZATION N/A 08/22/2019   Procedure: TRANSANAL HEMORRHOIDAL DEARTERIALIZATION;  Surgeon: Romie Levee, MD;  Location: [facility];  Service: General;  Laterality: N/A;   urethral stricutre  1989 or 1990   dilated   VESICOVAGINAL  FISTULA CLOSURE W/ TAH  1975    Family History  Problem Relation Age of Onset   Heart disease Mother        MI late 60s - CHF    Hypertension Mother    Cancer Father        throat CA   Diabetes Father    Heart disease Sister        CHF   Diabetes Sister    Stroke Brother    Diabetes Brother    Hypertension Brother    Diabetes Brother     Social History   Socioeconomic History   Marital status: Single    Spouse name: Not on file   Number of children: Not on file   Years of education: Not on file   Highest education level: Not on file  Occupational History   Not on file  Tobacco Use   Smoking status: Former    Current packs/day: 0.00    Types: Cigarettes    Start date: 04/07/1982    Quit date: 04/08/1987    Years since quitting: 35.6   Smokeless tobacco: Never   Tobacco comments:    1-2 cig per day  Vaping Use   Vaping status: Never Used  Substance and Sexual Activity   Alcohol use: Yes    Comment: occasional   Drug use: No   Sexual activity: Not on file  Other Topics Concern   Not on file  Social History Narrative   Lives in [place]; retired (worked for [employer]); Agricultural consultant for [employer].       Cell [phone]   Social Determinants of Health   Financial Resource Strain: Low Risk  (01/24/2021)   Overall Financial Resource Strain (CARDIA)    Difficulty of Paying Living Expenses: Not hard at all  Food Insecurity: No Food Insecurity (01/24/2021)   Hunger Vital Sign    Worried About Running Out of Food in the Last Year: Never true    Ran Out of Food in the Last Year: Never true  Transportation Needs: No Transportation Needs (01/24/2021)   PRAPARE - Administrator, Civil Service (Medical): No    Lack of Transportation (Non-Medical): No  Physical Activity: Sufficiently Active (01/24/2021)   Exercise Vital Sign    Days of Exercise per Week: 3 days    Minutes of Exercise per Session: 60 min  Stress: No Stress Concern Present (01/24/2021)    [employer] of Occupational Health - Occupational  Stress Questionnaire    Feeling of Stress : Not at all  Social Connections: Moderately Integrated (01/24/2021)   Social Connection and Isolation Panel [NHANES]    Frequency of Communication with Friends and Family: More than three times a week    Frequency of Social Gatherings with Friends and Family: Three times a week    Attends Religious Services: More than 4 times per year    Active Member of Clubs or Organizations: Yes    Attends Banker Meetings: More than 4 times per year    Marital Status: Never married  Intimate Partner Violence: Not At Risk (01/24/2021)   Humiliation, Afraid, Rape, and Kick questionnaire    Fear of Current or Ex-Partner: No    Emotionally Abused: No    Physically Abused: No    Sexually Abused: No   Review of Systems No fever No vomiting----?slight nausea 5 days ago Generally eating okay    Objective:   Physical Exam Constitutional:      Appearance: Normal appearance.  Cardiovascular:     Rate and Rhythm: Normal rate and regular rhythm.     Heart sounds: No murmur heard.    No gallop.  Pulmonary:     Effort: Pulmonary effort is normal.     Breath sounds: Normal breath sounds. No wheezing or rales.  Abdominal:     General: Bowel sounds are normal. There is no distension.     Palpations: Abdomen is soft.     Tenderness: There is no abdominal tenderness. There is no guarding or rebound.  Musculoskeletal:     Cervical back: Neck supple.  Lymphadenopathy:     Cervical: No cervical adenopathy.  Neurological:     Mental Status: She is alert.            Assessment & Plan:

## 2022-11-16 NOTE — Assessment & Plan Note (Signed)
Doesn't sound like gastroenteritis Reviewed the arsenic recall of apple juice--had just been [employer] but she got a message about [place] brand Discussed the difficulty of diagnosing this mor than 3 days after exposure More likely loose stools from apple juice itself--than arsenic She will avoid apple juice for now

## 2022-11-29 ENCOUNTER — Other Ambulatory Visit: Payer: Self-pay | Admitting: Family Medicine

## 2022-11-30 ENCOUNTER — Other Ambulatory Visit: Payer: Self-pay | Admitting: Family Medicine

## 2023-01-16 DIAGNOSIS — H43391 Other vitreous opacities, right eye: Secondary | ICD-10-CM | POA: Diagnosis not present

## 2023-01-16 DIAGNOSIS — H43811 Vitreous degeneration, right eye: Secondary | ICD-10-CM | POA: Diagnosis not present

## 2023-01-16 DIAGNOSIS — H30042 Focal chorioretinal inflammation, macular or paramacular, left eye: Secondary | ICD-10-CM | POA: Diagnosis not present

## 2023-01-16 DIAGNOSIS — H2511 Age-related nuclear cataract, right eye: Secondary | ICD-10-CM | POA: Diagnosis not present

## 2023-01-22 DIAGNOSIS — H30042 Focal chorioretinal inflammation, macular or paramacular, left eye: Secondary | ICD-10-CM | POA: Diagnosis not present

## 2023-01-24 ENCOUNTER — Telehealth: Payer: Self-pay | Admitting: Family Medicine

## 2023-01-24 DIAGNOSIS — E559 Vitamin D deficiency, unspecified: Secondary | ICD-10-CM

## 2023-01-24 DIAGNOSIS — M810 Age-related osteoporosis without current pathological fracture: Secondary | ICD-10-CM

## 2023-01-24 DIAGNOSIS — E039 Hypothyroidism, unspecified: Secondary | ICD-10-CM

## 2023-01-24 DIAGNOSIS — I10 Essential (primary) hypertension: Secondary | ICD-10-CM

## 2023-01-24 NOTE — Telephone Encounter (Signed)
-----   Message from Vincenza Hews sent at 01/15/2023 10:02 AM EST ----- Regarding: Lab Fri 01/26/23 Hello,  Patient is coming in for CPE labs on Friday 01/26/23. Can we get orders please.   Thanks

## 2023-01-25 DIAGNOSIS — L308 Other specified dermatitis: Secondary | ICD-10-CM | POA: Diagnosis not present

## 2023-01-26 ENCOUNTER — Other Ambulatory Visit (INDEPENDENT_AMBULATORY_CARE_PROVIDER_SITE_OTHER): Payer: Medicare Other

## 2023-01-26 DIAGNOSIS — E039 Hypothyroidism, unspecified: Secondary | ICD-10-CM | POA: Diagnosis not present

## 2023-01-26 DIAGNOSIS — I10 Essential (primary) hypertension: Secondary | ICD-10-CM

## 2023-01-26 DIAGNOSIS — E559 Vitamin D deficiency, unspecified: Secondary | ICD-10-CM | POA: Diagnosis not present

## 2023-01-26 LAB — LIPID PANEL
Cholesterol: 151 mg/dL (ref 0–200)
HDL: 41.3 mg/dL (ref >39.00)
LDL Cholesterol: 93 mg/dL (ref 0–99)
NonHDL: 109.2
Total CHOL/HDL Ratio: 4
Triglycerides: 81 mg/dL (ref 0.0–149.0)
VLDL: 16.2 mg/dL (ref 0.0–40.0)

## 2023-01-26 LAB — CBC WITH DIFFERENTIAL/PLATELET
Basophils Absolute: 0 10*3/uL (ref 0.0–0.1)
Basophils Relative: 0.4 % (ref 0.0–3.0)
Eosinophils Absolute: 0.3 10*3/uL (ref 0.0–0.7)
Eosinophils Relative: 4.1 % (ref 0.0–5.0)
HCT: 37.9 % (ref 36.0–46.0)
Hemoglobin: 12.4 g/dL (ref 12.0–15.0)
Lymphocytes Relative: 43.5 % (ref 12.0–46.0)
Lymphs Abs: 2.7 10*3/uL (ref 0.7–4.0)
MCHC: 32.8 g/dL (ref 30.0–36.0)
MCV: 89.9 fL (ref 78.0–100.0)
Monocytes Absolute: 0.4 10*3/uL (ref 0.1–1.0)
Monocytes Relative: 7.2 % (ref 3.0–12.0)
Neutro Abs: 2.7 10*3/uL (ref 1.4–7.7)
Neutrophils Relative %: 44.8 % (ref 43.0–77.0)
Platelets: 255 10*3/uL (ref 150.0–400.0)
RBC: 4.21 Mil/uL (ref 3.87–5.11)
RDW: 14.1 % (ref 11.5–15.5)
WBC: 6.1 10*3/uL (ref 4.0–10.5)

## 2023-01-26 LAB — COMPREHENSIVE METABOLIC PANEL
ALT: 9 U/L (ref 0–35)
AST: 17 U/L (ref 0–37)
Albumin: 4.2 g/dL (ref 3.5–5.2)
Alkaline Phosphatase: 67 U/L (ref 39–117)
BUN: 13 mg/dL (ref 6–23)
CO2: 28 meq/L (ref 19–32)
Calcium: 9.3 mg/dL (ref 8.4–10.5)
Chloride: 104 meq/L (ref 96–112)
Creatinine, Ser: 0.76 mg/dL (ref 0.40–1.20)
GFR: 76.72 mL/min (ref >60.00)
Glucose, Bld: 106 mg/dL — ABNORMAL HIGH (ref 70–99)
Potassium: 3.6 meq/L (ref 3.5–5.1)
Sodium: 141 meq/L (ref 135–145)
Total Bilirubin: 0.7 mg/dL (ref 0.2–1.2)
Total Protein: 6.7 g/dL (ref 6.0–8.3)

## 2023-01-26 LAB — VITAMIN D 25 HYDROXY (VIT D DEFICIENCY, FRACTURES): VITD: 49.28 ng/mL (ref 30.00–100.00)

## 2023-01-26 LAB — TSH: TSH: 0.02 u[IU]/mL — ABNORMAL LOW (ref 0.35–5.50)

## 2023-02-02 ENCOUNTER — Encounter: Payer: Self-pay | Admitting: Family Medicine

## 2023-02-02 ENCOUNTER — Telehealth: Payer: Self-pay | Admitting: Family Medicine

## 2023-02-02 ENCOUNTER — Ambulatory Visit (INDEPENDENT_AMBULATORY_CARE_PROVIDER_SITE_OTHER): Payer: Medicare Other | Admitting: Family Medicine

## 2023-02-02 VITALS — BP 130/75 | HR 74 | Temp 97.9°F | Ht 64.0 in | Wt 154.4 lb

## 2023-02-02 DIAGNOSIS — M810 Age-related osteoporosis without current pathological fracture: Secondary | ICD-10-CM

## 2023-02-02 DIAGNOSIS — H35033 Hypertensive retinopathy, bilateral: Secondary | ICD-10-CM | POA: Diagnosis not present

## 2023-02-02 DIAGNOSIS — E559 Vitamin D deficiency, unspecified: Secondary | ICD-10-CM

## 2023-02-02 DIAGNOSIS — Z1211 Encounter for screening for malignant neoplasm of colon: Secondary | ICD-10-CM | POA: Diagnosis not present

## 2023-02-02 DIAGNOSIS — E039 Hypothyroidism, unspecified: Secondary | ICD-10-CM | POA: Diagnosis not present

## 2023-02-02 DIAGNOSIS — I10 Essential (primary) hypertension: Secondary | ICD-10-CM | POA: Diagnosis not present

## 2023-02-02 DIAGNOSIS — Z0000 Encounter for general adult medical examination without abnormal findings: Secondary | ICD-10-CM

## 2023-02-02 DIAGNOSIS — R7989 Other specified abnormal findings of blood chemistry: Secondary | ICD-10-CM | POA: Diagnosis not present

## 2023-02-02 DIAGNOSIS — J301 Allergic rhinitis due to pollen: Secondary | ICD-10-CM | POA: Diagnosis not present

## 2023-02-02 DIAGNOSIS — Z23 Encounter for immunization: Secondary | ICD-10-CM | POA: Diagnosis not present

## 2023-02-02 MED ORDER — HYDROCHLOROTHIAZIDE 25 MG PO TABS: 25.0000 mg | ORAL_TABLET | Freq: Every day | ORAL | 3 refills | Status: DC

## 2023-02-02 MED ORDER — LEVOCETIRIZINE DIHYDROCHLORIDE 5 MG PO TABS: 5.0000 mg | ORAL_TABLET | Freq: Every day | ORAL | 3 refills | Status: DC | PRN

## 2023-02-02 MED ORDER — METOPROLOL SUCCINATE ER 100 MG PO TB24: ORAL_TABLET | ORAL | 3 refills | Status: DC

## 2023-02-02 MED ORDER — AMLODIPINE BESYLATE 5 MG PO TABS: 5.0000 mg | ORAL_TABLET | Freq: Every day | ORAL | 3 refills | Status: DC

## 2023-02-02 MED ORDER — FLUTICASONE PROPIONATE 50 MCG/ACT NA SUSP: 2.0000 | Freq: Every day | NASAL | 3 refills | Status: DC | PRN

## 2023-02-02 NOTE — Patient Instructions (Addendum)
If you are interested in the new shingles vaccine (Shingrix) - call your local pharmacy to check on coverage and availability   Take care of yourself   Let your endocrinologist know Lab Results  Component Value Date   TSH 0.02 (L) 01/26/2023    Keep up the good work  Stay active

## 2023-02-02 NOTE — Progress Notes (Unsigned)
Subjective:    Patient ID: Susan Woods, female    DOB: 11/02/1945, 77 y.o.   MRN: [id number]  HPI  Here for health maintenance exam and to review chronic medical problems   Wt Readings from Last 3 Encounters:  02/02/23 154 lb 6 oz (70 kg)  11/16/22 161 lb (73 kg)  10/05/22 162 lb 6 oz (73.7 kg)   26.50 kg/m  Vitals:   02/02/23 0817 02/02/23 0850  BP: 134/78 130/75  Pulse: 74   Temp: 97.9 F (36.6 C)   SpO2: 96%     Immunization History  Administered Date(s) Administered   Influenza Split 02/20/2011   Influenza Whole 12/20/2005   Influenza, Seasonal, Injecte, Preservative Fre 02/19/2012, 02/02/2023   Influenza,inj,Quad PF,6+ Mos 12/31/2012, 05/01/2014, 12/22/2014, 11/19/2017, 12/01/2018, 12/17/2019, 01/14/2021, 11/25/2021   Influenza-Unspecified 11/23/2016   Moderna SARS-COV2 Booster Vaccination 03/23/2020   Moderna Sars-Covid-2 Vaccination 04/15/2019, 05/14/2019   Pneumococcal Conjugate-13 12/22/2014   Pneumococcal Polysaccharide-23 12/31/2012   Td 03/14/1999, 07/03/2006   Tdap 12/16/2015   Zoster, Live 03/24/2011    Health Maintenance Due  Topic Date Due   DEXA SCAN  01/25/2015   MAMMOGRAM  02/01/2023   COLON CANCER SCREENING ANNUAL FOBT  02/02/2023   Flu shot today -today   Shingrix-not covered 2 y ago /will check on that    Mammogram is scheduled next week Self breast exam-no lumps   Gyn health No problems    Colon cancer screening -colonoscopy due 2027   10 y recall  Bone health  Dexa 2014 osteoporosis  Declines another one /refuses  Falls- none  Fractures-none  Supplements  Last vitamin D Lab Results  Component Value Date   VD25OH 49.28 01/26/2023    Exercise  Water aerobics Elliptical Circuit training  Chair yoga    Mood    02/02/2023    8:28 AM 09/26/2022   12:44 PM 01/30/2022    9:33 AM 07/11/2021    9:55 AM 01/24/2021    9:59 AM  Depression screen PHQ 2/9  Decreased Interest 0 0 0 0 0  Down, Depressed,  Hopeless 0 0 0 0 0  PHQ - 2 Score 0 0 0 0 0  Altered sleeping 0 0     Tired, decreased energy 0 0     Change in appetite 0 0     Feeling bad or failure about yourself  0 0     Trouble concentrating 0 0     Moving slowly or fidgety/restless 0 0     Suicidal thoughts 0 0     PHQ-9 Score 0 0     Difficult doing work/chores Not difficult at all Not difficult at all      HTN bp is stable today  No cp or palpitations or headaches or edema  No side effects to medicines  BP Readings from Last 3 Encounters:  02/02/23 130/75  11/16/22 138/84  10/26/22 138/84   Continues amlodipine 5 mg daily  Hydrochlorothiazide 25 mg daily  Metoprolol xl  100 mg daily   Lab Results  Component Value Date   NA 141 01/26/2023   K 3.6 01/26/2023   CO2 28 01/26/2023   GLUCOSE 106 (H) 01/26/2023   BUN 13 01/26/2023   CREATININE 0.76 01/26/2023   CALCIUM 9.3 01/26/2023   GFR 76.72 01/26/2023   GFRNONAA >60 10/05/2022    Saw endo for low TSH TSH 0.262 with FT4 0.77  Goes back for a check in April   Lab Results  Component Value Date   ALT 9 01/26/2023   AST 17 01/26/2023   ALKPHOS 67 01/26/2023   BILITOT 0.7 01/26/2023   Lab Results  Component Value Date   WBC 6.1 01/26/2023   HGB 12.4 01/26/2023   HCT 37.9 01/26/2023   MCV 89.9 01/26/2023   PLT 255.0 01/26/2023   Lab Results  Component Value Date   TSH 0.02 (L) 01/26/2023   Cholesterol Lab Results  Component Value Date   CHOL 151 01/26/2023   CHOL 149 01/30/2022   CHOL 156 01/27/2021   Lab Results  Component Value Date   HDL 41.30 01/26/2023   HDL 50.80 01/30/2022   HDL 51.00 01/27/2021   Lab Results  Component Value Date   LDLCALC 93 01/26/2023   LDLCALC 82 01/30/2022   LDLCALC 92 01/27/2021   Lab Results  Component Value Date   TRIG 81.0 01/26/2023   TRIG 81.0 01/30/2022   TRIG 67.0 01/27/2021   Lab Results  Component Value Date   CHOLHDL 4 01/26/2023   CHOLHDL 3 01/30/2022   CHOLHDL 3 01/27/2021   No  results found for: "LDLDIRECT" HDL is down  ? 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   Colon cancer screening 12/22/2014   Hypertensive retinopathy of both eyes, grade 1 12/16/2013   Routine general medical examination at a health care facility 02/20/2011   MENINGIOMA 11/17/2009   Osteoporosis 03/19/2007   HYPOGLYCEMIA 06/12/2006   Essential hypertension 06/12/2006   Allergic rhinitis 06/12/2006   GERD 06/12/2006   Past Medical History:  Diagnosis Date   Anemia    Arthritis    in hips   GERD (gastroesophageal reflux disease)    History of hypothyroidism    no current meds   HTN (hypertension)    echo (8/11): EF 55%, mild MR, mild TR   Nasal pruritis    full body w/o rash    Neuromuscular disorder (HCC)    cramps occ after working out   Nonallergic rhinitis    Osteoporosis    Psoriasis    mild intermittent    Thyroid disease    Tingling    in hands of ? etiol   Past Surgical History:  Procedure Laterality Date   2D echo  10/2009   nml    ABDOMINAL HYSTERECTOMY  1975   with partial 1 ovary removed   ABDOMINAL SURGERY  1976   adhesions   CESAREAN SECTION  1971   COLONOSCOPY  2006   dexa - osteopenia  2005   dexa 2009 - osteoporosis/fairly stable    hosp TIA  10/2009   no problems since, told in hospital not tia   NASAL SINUS SURGERY  2002   OOPHORECTOMY     ORIF FINGER / THUMB FRACTURE Right 02/2005   ovary removed  1976   removal of internal stitch that did not disolve  1977   RETINAL DETACHMENT SURGERY Left 10/25/2020   11/25/20   TRANSANAL HEMORRHOIDAL DEARTERIALIZATION N/A 08/22/2019   Procedure: TRANSANAL HEMORRHOIDAL DEARTERIALIZATION;  Surgeon: Romie Levee, MD;  Location:  [facility] [place];  Service: General;  Laterality: N/A;   urethral stricutre  1989 or 1990   dilated  VESICOVAGINAL FISTULA CLOSURE W/ TAH  1975   Social History   Tobacco Use   Smoking status: Former    Current packs/day: 0.00    Types: Cigarettes    Start date: 04/07/1982    Quit date: 04/08/1987    Years since quitting: 35.8   Smokeless tobacco: Never   Tobacco comments:    1-2 cig per day  Vaping Use   Vaping status: Never Used  Substance Use Topics   Alcohol use: Yes    Comment: occasional   Drug use: No   Family History  Problem Relation Age of Onset   Heart disease Mother        MI late 14s - CHF    Hypertension Mother    Cancer Father        throat CA   Diabetes Father    Heart disease Sister        CHF   Diabetes Sister    Stroke Brother    Diabetes Brother    Hypertension Brother    Diabetes Brother    Allergies  Allergen Reactions   Amlodipine Swelling    Ankle swelling   Azithromycin     Heart palpitations on z pack   Levaquin [Levofloxacin In D5w] Other (See Comments)    Muscle pain    Losartan     The 100 mg pill caused some back pain   (? Also a little dizziness and possibly headache)   Penicillins     REACTION: urticaria (hives), knots in abdomen easy bruising   Shrimp [Shellfish Allergy]     Throat tries to close   Sulfonamide Derivatives     REACTION: tingling around mouth and hands felt tight   Povidone-Iodine Hives and Itching   Current Outpatient Medications on File Prior to Visit  Medication Sig Dispense Refill   Cholecalciferol (VITAMIN D3) 1000 UNITS CAPS Take 1 capsule by mouth daily.     cyclobenzaprine (FLEXERIL) 10 MG tablet TAKE 1/2-1 TABLET BY MOUTH 3 TIMES DAILY AS NEEDED FOR MUSCLE SPASMS (WATCH FOR SEDATION) 30 tablet 3   Ferrous Sulfate 250 MG CPCR Take 1 capsule by mouth daily.     fish oil-omega-3 fatty acids 1000 MG capsule Take 2 g by mouth daily. Alternate with flax oil  500 mg in am     magnesium 30 MG  tablet Take 30 mg by mouth daily.     Multiple Vitamin (MULTIVITAMIN) tablet Take 1 tablet by mouth daily.     NON FORMULARY similisan dry eye drops daily in am     No current facility-administered medications on file prior to visit.    Review of Systems  Constitutional:  Negative for activity change, appetite change, fatigue, fever and unexpected weight change.  HENT:  Negative for congestion, ear pain, rhinorrhea, sinus pressure and sore throat.   Eyes:  Negative for pain, redness and visual disturbance.  Respiratory:  Negative for cough, shortness of breath and wheezing.   Cardiovascular:  Negative for chest pain and palpitations.  Gastrointestinal:  Negative for abdominal pain, blood in stool, constipation and diarrhea.  Endocrine: Negative for polydipsia and polyuria.  Genitourinary:  Negative for dysuria, frequency and urgency.  Musculoskeletal:  Negative for arthralgias, back pain and myalgias.  Skin:  Negative for pallor and rash.  Allergic/Immunologic: Negative for environmental allergies.  Neurological:  Negative for dizziness, syncope and headaches.  Hematological:  Negative for adenopathy. Does not bruise/bleed easily.  Psychiatric/Behavioral:  Negative for decreased concentration and dysphoric mood.  The patient is not nervous/anxious.        Objective:   Physical Exam Constitutional:      General: She is not in acute distress.    Appearance: Normal appearance. She is well-developed and normal weight. She is not ill-appearing or diaphoretic.  HENT:     Head: Normocephalic and atraumatic.     Right Ear: Tympanic membrane, ear canal and external ear normal.     Left Ear: Tympanic membrane, ear canal and external ear normal.     Nose: Nose normal. No congestion.     Mouth/Throat:     Mouth: Mucous membranes are moist.     Pharynx: Oropharynx is clear. No posterior oropharyngeal erythema.  Eyes:     General: No scleral icterus.    Extraocular Movements: Extraocular  movements intact.     Conjunctiva/sclera: Conjunctivae normal.     Pupils: Pupils are equal, round, and reactive to light.  Neck:     Thyroid: No thyromegaly.     Vascular: No carotid bruit or JVD.  Cardiovascular:     Rate and Rhythm: Normal rate and regular rhythm.     Pulses: Normal pulses.     Heart sounds: Normal heart sounds.     No gallop.  Pulmonary:     Effort: Pulmonary effort is normal. No respiratory distress.     Breath sounds: Normal breath sounds. No wheezing.     Comments: Good air exch Chest:     Chest wall: No tenderness.  Abdominal:     General: Bowel sounds are normal. There is no distension or abdominal bruit.     Palpations: Abdomen is soft. There is no mass.     Tenderness: There is no abdominal tenderness.     Hernia: No hernia is present.  Genitourinary:    Comments: Breast exam: No mass, nodules, thickening, tenderness, bulging, retraction, inflamation, nipple discharge or skin changes noted.  No axillary or clavicular LA.     Musculoskeletal:        General: No tenderness. Normal range of motion.     Cervical back: Normal range of motion and neck supple. No rigidity. No muscular tenderness.     Right lower leg: No edema.     Left lower leg: No edema.     Comments: No kyphosis   Lymphadenopathy:     Cervical: No cervical adenopathy.  Skin:    General: Skin is warm and dry.     Coloration: Skin is not pale.     Findings: No erythema or rash.  Neurological:     Mental Status: She is alert. Mental status is at baseline.     Cranial Nerves: No cranial nerve deficit.     Motor: No abnormal muscle tone.     Coordination: Coordination normal.     Gait: Gait normal.     Deep Tendon Reflexes: Reflexes are normal and symmetric. Reflexes normal.  Psychiatric:        Mood and Affect: Mood normal.        Cognition and Memory: Cognition and memory normal.           Assessment & Plan:   Problem List Items Addressed This Visit       Cardiovascular  and Mediastinum   Essential hypertension    bp in fair control at this time  BP Readings from Last 1 Encounters:  02/02/23 130/75   No changes needed Most recent labs reviewed  Disc lifstyle change with low sodium diet and  exercise  Continues amlodipine 5 mg daily  Hydrochlorothiazide 25 mg daily  Metoprolol xl  100 mg daily         Relevant Medications   amLODipine (NORVASC) 5 MG tablet   hydrochlorothiazide (HYDRODIURIL) 25 MG tablet   metoprolol succinate (TOPROL-XL) 100 MG 24 hr tablet     Respiratory   Allergic rhinitis    Refilled flonase for daily prn use  This is helpful 2 sp each nostril daily in season         Endocrine   Hypothyroid    TSH is low  No longer on thyroid replacement , seeing endocrinology      Relevant Medications   metoprolol succinate (TOPROL-XL) 100 MG 24 hr tablet     Musculoskeletal and Integument   Osteoporosis    Declines another DEXA or any treatment No falls or fracture  Good D level  Discussed fall prevention, supplements and exercise for bone density          Other   Vitamin D deficiency    Last vitamin D Lab Results  Component Value Date   VD25OH 49.28 01/26/2023   Vitamin D level is therapeutic with current supplementation Disc importance of this to bone and overall health       Routine general medical examination at a health care facility - Primary    Reviewed health habits including diet and exercise and skin cancer prevention Reviewed appropriate screening tests for age  Also reviewed health mt list, fam hx and immunization status , as well as social and family history   See HPI Labs reviewed and ordered Flu shot given  Mammogram scheduled next week  Discussed fall prevention, supplements and exercise for bone density  Declines dexa  PHQ 0  Health Maintenance  Topic Date Due   DEXA scan (bone density measurement)  01/25/2015   Medicare Annual Wellness Visit  01/24/2022   Mammogram  02/01/2023   Stool  Blood Test  02/02/2023   Zoster (Shingles) Vaccine (1 of 2) 05/05/2023*   COVID-19 Vaccine (3 - Moderna risk series) 02/16/2025*   Hepatitis C Screening  01/12/2028*   Colon Cancer Screening  04/25/2025   DTaP/Tdap/Td vaccine (4 - Td or Tdap) 12/15/2025   Pneumonia Vaccine  Completed   Flu Shot  Completed   HPV Vaccine  Aged Out  *Topic was postponed. The date shown is not the original due date.         Low TSH level    Reviewed endocrinology note Last TSH there was 0.262 with FT4 of 0.77  Here Lab Results  Component Value Date   TSH 0.02 (L) 01/26/2023   May end up needing treatment  Will sent do endocrinology and pt will follow up  Clinically stable        Hypertensive retinopathy of both eyes, grade 1    Continues oph care  HTN is controlled       Colon cancer screening    Colonoscopy due in 2027 for 10 y recall if she wants to / will be over 77      Other Visit Diagnoses     Need for influenza vaccination       Relevant Orders   Flu vaccine trivalent PF, 6mos and older(Flulaval,Afluria,Fluarix,Fluzone) (Completed)

## 2023-02-02 NOTE — Telephone Encounter (Signed)
done

## 2023-02-02 NOTE — Telephone Encounter (Signed)
Patient would like for any medications refilled on 11/22 at [facility] to be sent to [facility] home delivery pharmacy. Please send all future refills there as well

## 2023-02-04 NOTE — Assessment & Plan Note (Signed)
Reviewed health habits including diet and exercise and skin cancer prevention Reviewed appropriate screening tests for age  Also reviewed health mt list, fam hx and immunization status , as well as social and family history   See HPI Labs reviewed and ordered Flu shot given  Mammogram scheduled next week  Discussed fall prevention, supplements and exercise for bone density  Declines dexa  PHQ 0  Health Maintenance  Topic Date Due   DEXA scan (bone density measurement)  01/25/2015   Medicare Annual Wellness Visit  01/24/2022   Mammogram  02/01/2023   Stool Blood Test  02/02/2023   Zoster (Shingles) Vaccine (1 of 2) 05/05/2023*   COVID-19 Vaccine (3 - Moderna risk series) 02/16/2025*   Hepatitis C Screening  01/12/2028*   Colon Cancer Screening  04/25/2025   DTaP/Tdap/Td vaccine (4 - Td or Tdap) 12/15/2025   Pneumonia Vaccine  Completed   Flu Shot  Completed   HPV Vaccine  Aged Out  *Topic was postponed. The date shown is not the original due date.

## 2023-02-04 NOTE — Assessment & Plan Note (Signed)
Continues oph care  HTN is controlled

## 2023-02-04 NOTE — Assessment & Plan Note (Signed)
Last vitamin D Lab Results  Component Value Date   VD25OH 49.28 01/26/2023   Vitamin D level is therapeutic with current supplementation Disc importance of this to bone and overall health

## 2023-02-04 NOTE — Assessment & Plan Note (Signed)
Refilled flonase for daily prn use  This is helpful 2 sp each nostril daily in season

## 2023-02-04 NOTE — Assessment & Plan Note (Signed)
TSH is low  No longer on thyroid replacement , seeing endocrinology

## 2023-02-04 NOTE — Assessment & Plan Note (Signed)
bp in fair control at this time  BP Readings from Last 1 Encounters:  02/02/23 130/75   No changes needed Most recent labs reviewed  Disc lifstyle change with low sodium diet and exercise  Continues amlodipine 5 mg daily  Hydrochlorothiazide 25 mg daily  Metoprolol xl  100 mg daily

## 2023-02-04 NOTE — Assessment & Plan Note (Signed)
Reviewed endocrinology note Last TSH there was 0.262 with FT4 of 0.77  Here Lab Results  Component Value Date   TSH 0.02 (L) 01/26/2023   May end up needing treatment  Will sent do endocrinology and pt will follow up  Clinically stable

## 2023-02-04 NOTE — Assessment & Plan Note (Signed)
Declines another DEXA or any treatment No falls or fracture  Good D level  Discussed fall prevention, supplements and exercise for bone density

## 2023-02-04 NOTE — Assessment & Plan Note (Addendum)
Colonoscopy due in 2027 for 10 y recall if she wants to / will be over 77

## 2023-02-06 DIAGNOSIS — Z1231 Encounter for screening mammogram for malignant neoplasm of breast: Secondary | ICD-10-CM | POA: Diagnosis not present

## 2023-02-06 LAB — HM MAMMOGRAPHY

## 2023-02-07 ENCOUNTER — Encounter: Payer: Self-pay | Admitting: Family Medicine

## 2023-02-13 DIAGNOSIS — E0590 Thyrotoxicosis, unspecified without thyrotoxic crisis or storm: Secondary | ICD-10-CM | POA: Diagnosis not present

## 2023-02-19 DIAGNOSIS — H43811 Vitreous degeneration, right eye: Secondary | ICD-10-CM | POA: Diagnosis not present

## 2023-02-19 DIAGNOSIS — H43391 Other vitreous opacities, right eye: Secondary | ICD-10-CM | POA: Diagnosis not present

## 2023-02-19 DIAGNOSIS — H33191 Other retinoschisis and retinal cysts, right eye: Secondary | ICD-10-CM | POA: Diagnosis not present

## 2023-02-19 DIAGNOSIS — H30042 Focal chorioretinal inflammation, macular or paramacular, left eye: Secondary | ICD-10-CM | POA: Diagnosis not present

## 2023-02-19 DIAGNOSIS — H2511 Age-related nuclear cataract, right eye: Secondary | ICD-10-CM | POA: Diagnosis not present

## 2023-04-25 DIAGNOSIS — H43811 Vitreous degeneration, right eye: Secondary | ICD-10-CM | POA: Diagnosis not present

## 2023-04-25 DIAGNOSIS — H2511 Age-related nuclear cataract, right eye: Secondary | ICD-10-CM | POA: Diagnosis not present

## 2023-04-25 DIAGNOSIS — H30042 Focal chorioretinal inflammation, macular or paramacular, left eye: Secondary | ICD-10-CM | POA: Diagnosis not present

## 2023-04-25 DIAGNOSIS — H43391 Other vitreous opacities, right eye: Secondary | ICD-10-CM | POA: Diagnosis not present

## 2023-04-25 DIAGNOSIS — H33191 Other retinoschisis and retinal cysts, right eye: Secondary | ICD-10-CM | POA: Diagnosis not present

## 2023-05-07 DIAGNOSIS — H5213 Myopia, bilateral: Secondary | ICD-10-CM | POA: Diagnosis not present

## 2023-05-07 DIAGNOSIS — Z9842 Cataract extraction status, left eye: Secondary | ICD-10-CM | POA: Diagnosis not present

## 2023-05-07 DIAGNOSIS — H524 Presbyopia: Secondary | ICD-10-CM | POA: Diagnosis not present

## 2023-05-07 DIAGNOSIS — H04123 Dry eye syndrome of bilateral lacrimal glands: Secondary | ICD-10-CM | POA: Diagnosis not present

## 2023-05-07 DIAGNOSIS — H2511 Age-related nuclear cataract, right eye: Secondary | ICD-10-CM | POA: Diagnosis not present

## 2023-05-11 DIAGNOSIS — H30042 Focal chorioretinal inflammation, macular or paramacular, left eye: Secondary | ICD-10-CM | POA: Diagnosis not present

## 2023-06-11 DIAGNOSIS — H2511 Age-related nuclear cataract, right eye: Secondary | ICD-10-CM | POA: Diagnosis not present

## 2023-06-11 DIAGNOSIS — H43391 Other vitreous opacities, right eye: Secondary | ICD-10-CM | POA: Diagnosis not present

## 2023-06-11 DIAGNOSIS — H33191 Other retinoschisis and retinal cysts, right eye: Secondary | ICD-10-CM | POA: Diagnosis not present

## 2023-06-11 DIAGNOSIS — H30042 Focal chorioretinal inflammation, macular or paramacular, left eye: Secondary | ICD-10-CM | POA: Diagnosis not present

## 2023-06-11 DIAGNOSIS — H43811 Vitreous degeneration, right eye: Secondary | ICD-10-CM | POA: Diagnosis not present

## 2023-06-21 DIAGNOSIS — E0590 Thyrotoxicosis, unspecified without thyrotoxic crisis or storm: Secondary | ICD-10-CM | POA: Diagnosis not present

## 2023-07-10 ENCOUNTER — Telehealth: Payer: Self-pay | Admitting: Family Medicine

## 2023-07-10 NOTE — Telephone Encounter (Signed)
 Form in your inbox

## 2023-07-10 NOTE — Telephone Encounter (Signed)
 Patient dropped Advance Care Planning Documents at front desk placed in providers box for Review

## 2023-07-11 NOTE — Telephone Encounter (Signed)
 Done and in IN box

## 2023-07-12 NOTE — Telephone Encounter (Signed)
 Copy sent to scanning. Pt advised me she is out of town for the next month and can't pick up form, form mailed to pt, per pt request

## 2023-08-29 DIAGNOSIS — H43811 Vitreous degeneration, right eye: Secondary | ICD-10-CM | POA: Diagnosis not present

## 2023-08-29 DIAGNOSIS — H30042 Focal chorioretinal inflammation, macular or paramacular, left eye: Secondary | ICD-10-CM | POA: Diagnosis not present

## 2023-08-29 DIAGNOSIS — H43391 Other vitreous opacities, right eye: Secondary | ICD-10-CM | POA: Diagnosis not present

## 2023-08-29 DIAGNOSIS — H2511 Age-related nuclear cataract, right eye: Secondary | ICD-10-CM | POA: Diagnosis not present

## 2023-08-29 DIAGNOSIS — H33191 Other retinoschisis and retinal cysts, right eye: Secondary | ICD-10-CM | POA: Diagnosis not present

## 2023-10-03 DIAGNOSIS — H30042 Focal chorioretinal inflammation, macular or paramacular, left eye: Secondary | ICD-10-CM | POA: Diagnosis not present

## 2023-11-06 DIAGNOSIS — H35352 Cystoid macular degeneration, left eye: Secondary | ICD-10-CM | POA: Diagnosis not present

## 2023-11-06 DIAGNOSIS — H30042 Focal chorioretinal inflammation, macular or paramacular, left eye: Secondary | ICD-10-CM | POA: Diagnosis not present

## 2023-11-06 DIAGNOSIS — H43391 Other vitreous opacities, right eye: Secondary | ICD-10-CM | POA: Diagnosis not present

## 2023-11-06 DIAGNOSIS — H43811 Vitreous degeneration, right eye: Secondary | ICD-10-CM | POA: Diagnosis not present

## 2023-11-06 DIAGNOSIS — H2511 Age-related nuclear cataract, right eye: Secondary | ICD-10-CM | POA: Diagnosis not present

## 2023-11-06 DIAGNOSIS — H33191 Other retinoschisis and retinal cysts, right eye: Secondary | ICD-10-CM | POA: Diagnosis not present

## 2023-12-18 ENCOUNTER — Other Ambulatory Visit: Payer: Self-pay | Admitting: Family Medicine

## 2023-12-19 ENCOUNTER — Ambulatory Visit: Payer: Self-pay | Admitting: General Surgery

## 2023-12-19 DIAGNOSIS — K642 Third degree hemorrhoids: Secondary | ICD-10-CM | POA: Diagnosis not present

## 2023-12-19 NOTE — H&P (Signed)
 PROVIDER:  BERNARDA WANDA NED, MD  MRN: [id number] DOB: 1945/06/15 DATE OF ENCOUNTER: 12/19/2023  Subjective     History of Present Illness: Susan Woods is a 78 y.o. female who underwent THD in 2021.  She reports an episode of significant rectal bleeding approximately 2 weeks ago.  It has since subsided.  She denies any pain associated with this.  Last colonoscopy was 2017.  She is due again in 2027.   Review of Systems: A complete review of systems was obtained from the patient.  I have reviewed this information and discussed as appropriate with the patient.  See HPI as well for other ROS.   Medical History: Past Medical History:  Diagnosis Date   Anemia    Anxiety    Hypertension    Hypothyroidism     There is no problem list on file for this patient.   Past Surgical History:  Procedure Laterality Date   CESAREAN SECTION  1971   ABDOMINAL HYSTERECTOMY W/ PARTIAL VAGINACTOMY  1975   HYSTERECTOMY TOTAL ABDOMINAL W/COLPO-URETHROCYSTOPEXY  1976   also had a tubal pregnancy   LAPAROSCOPIC LYSIS INTESTINAL ADHESIONS  1976   OTHER SURGERY  1977   removal of internal stich that didn't desolve   TONSILLECTOMY  1979   REPAIR RETINAL DETACHMENT BY AIR/GAS  2022     Allergies  Allergen Reactions   Sulfa (Sulfonamide Antibiotics) Other (See Comments)    Numbness around lips   Amlodipine  Swelling    Ankle swelling   Azithromycin  Unknown   Indomethacin Unknown    REACTION: swelling, rash   Iodine Itching   Levofloxacin  In D5w Unknown    Muscle pain    Losartan  Unknown    The 100 mg pill caused some back pain   (? Also a little dizziness and possibly headache)   Penicillins Unknown    REACTION: urticaria (hives)   Betadine [Povidone-Iodine] Hives and Itching   Penicillin Other (See Comments)    Knots in abd, easy bruising    Current Outpatient Medications on File Prior to Visit  Medication Sig Dispense Refill   cholecalciferol (VITAMIN D3) 1,000 unit  capsule Take 2,000 Units by mouth once daily     ferrous sulfate 325 (65 FE) MG tablet Take 325 mg by mouth daily with breakfast.     hydroCHLOROthiazide  (HYDRODIURIL ) 25 MG tablet Take 25 mg by mouth once daily.     metoprolol  succinate (TOPROL -XL) 100 MG XL tablet Take 100 mg by mouth once daily.     multivitamin tablet Take 1 tablet by mouth once daily.     omega-3 acid ethyl esters (LOVAZA) 1 gram capsule Take 1 g by mouth once daily     amLODIPine  (NORVASC ) 5 MG tablet Take 1 tablet (5 mg total) by mouth once daily 30 tablet 6   No current facility-administered medications on file prior to visit.    Family History  Problem Relation Age of Onset   Myocardial Infarction (Heart attack) Mother        COD   High blood pressure (Hypertension) Father    Throat cancer Father    Diabetes Father    Diabetes Sister    High blood pressure (Hypertension) Sister    Diabetes Brother    No Known Problems Maternal Grandmother    No Known Problems Maternal Grandfather    No Known Problems Paternal Grandmother    No Known Problems Paternal Grandfather    Diabetes Sister    High blood pressure (Hypertension)  Sister    Diabetes Sister    High blood pressure (Hypertension) Sister    Diabetes Sister    High blood pressure (Hypertension) Sister    No Known Problems Brother    Stroke Brother    Transient ischemic attack Brother    Diabetes Brother        amputation also    Thyroid  disease Daughter    Thyroid  nodules Daughter      Social History   Tobacco Use  Smoking Status Former   Current packs/day: 0.00   Types: Cigarettes   Quit date: 1989   Years since quitting: 36.7  Smokeless Tobacco Never     Social History   Socioeconomic History   Marital status: Single  Tobacco Use   Smoking status: Former    Current packs/day: 0.00    Types: Cigarettes    Quit date: 1989    Years since quitting: 36.7   Smokeless tobacco: Never  Vaping Use   Vaping status: Never Used  Substance  and Sexual Activity   Alcohol use: No   Drug use: Never   Sexual activity: Defer   Social Drivers of Corporate investment banker Strain: Low Risk  (01/24/2021)   Received from [facility]   Overall Financial Resource Strain (CARDIA)    Difficulty of Paying Living Expenses: Not hard at all  Food Insecurity: No Food Insecurity (01/24/2021)   Received from [facility]   Hunger Vital Sign    Within the past 12 months, you worried that your food would run out before you got the money to buy more.: Never true    Within the past 12 months, the food you bought just didn't last and you didn't have money to get more.: Never true  Transportation Needs: No Transportation Needs (01/24/2021)   Received from [facility] - Transportation    Lack of Transportation (Medical): No    Lack of Transportation (Non-Medical): No  Physical Activity: Sufficiently Active (01/24/2021)   Received from [facility]   Exercise Vital Sign    On average, how many days per week do you engage in moderate to strenuous exercise (like a brisk walk)?: 3 days    On average, how many minutes do you engage in exercise at this level?: 60 min  Stress: No Stress Concern Present (01/24/2021)   Received from [facility] - Occupational Stress Questionnaire    Feeling of Stress : Not at all  Social Connections: Moderately Integrated (01/24/2021)   Received from [facility]   Social Connection and Isolation Panel    In a typical week, how many times do you talk on the phone with family, friends, or neighbors?: More than three times a week    How often do you get together with friends or relatives?: Three times a week    How often do you attend church or religious services?: More than 4 times per year    Do you belong to any clubs or organizations such as church groups, unions, fraternal or athletic groups, or school groups?: Yes    How often do you attend meetings of the clubs  or organizations you belong to?: More than 4 times per year    Are you married, widowed, divorced, separated, never married, or living with a partner?: Never married  Housing Stability: Unknown (06/21/2023)   Housing Stability Vital Sign    Homeless in the Last Year: No    Objective:  Vitals:   12/19/23 1504 12/19/23 1505  BP: 132/85   Pulse: 93   Temp: 36.7 C (98 F)   SpO2: 93%   Weight: 71.6 kg (157 lb 12.8 oz)   Height: 167.6 cm (5' 6)   PainSc:  0-No pain     Exam Gen: NAD Abd: soft Rectal: large anterior skin tag   Labs, Imaging and Diagnostic Testing:  Procedure: Anoscopy Surgeon: Debby After the risks and benefits were explained, written consent was obtained for above procedure.  A medical assistant chaperone was present thoroughout the entire procedure. Examination chaperoned by Burnard Crete, LPN. Anesthesia: none Diagnosis: rectal bleeding Findings: Grade 3 right anterior hemorrhoid with possibly dysplastic lesion at the dentate line.  Assessment and Plan:  Prolapsed internal hemorrhoids, grade 3  (primary encounter diagnosis)   78 year old female with continued hemorrhoidal prolapse and rectal bleeding despite THD.  She has 1 grade 3 hemorrhoid that appears inflamed and possibly has a dysplastic lesion associated with this.  I have recommended single column hemorrhoidectomy.  I do not think the other 2 hemorrhoids will need any treatment at this time.  Risk include bleeding, pain and recurrence.  All questions answered.  Patient would like to proceed with surgery.  Bernarda JAYSON Debby, MD Colon and Rectal Surgery [facility]

## 2024-01-27 ENCOUNTER — Telehealth: Payer: Self-pay | Admitting: Family Medicine

## 2024-01-27 DIAGNOSIS — M810 Age-related osteoporosis without current pathological fracture: Secondary | ICD-10-CM

## 2024-01-27 DIAGNOSIS — E559 Vitamin D deficiency, unspecified: Secondary | ICD-10-CM

## 2024-01-27 DIAGNOSIS — E039 Hypothyroidism, unspecified: Secondary | ICD-10-CM

## 2024-01-27 DIAGNOSIS — I10 Essential (primary) hypertension: Secondary | ICD-10-CM

## 2024-01-27 NOTE — Telephone Encounter (Signed)
-----   Message from Veva JINNY Ferrari sent at 01/16/2024  2:28 PM EST ----- Regarding: Lab orders for Mon, 11.17.25 Patient is scheduled for CPX labs, please order future labs, Thanks , Veva

## 2024-01-28 ENCOUNTER — Other Ambulatory Visit (INDEPENDENT_AMBULATORY_CARE_PROVIDER_SITE_OTHER): Payer: Medicare Other

## 2024-01-28 ENCOUNTER — Ambulatory Visit: Payer: Self-pay | Admitting: Family Medicine

## 2024-01-28 DIAGNOSIS — E039 Hypothyroidism, unspecified: Secondary | ICD-10-CM | POA: Diagnosis not present

## 2024-01-28 DIAGNOSIS — E559 Vitamin D deficiency, unspecified: Secondary | ICD-10-CM

## 2024-01-28 DIAGNOSIS — I10 Essential (primary) hypertension: Secondary | ICD-10-CM | POA: Diagnosis not present

## 2024-01-28 LAB — COMPREHENSIVE METABOLIC PANEL WITH GFR
ALT: 11 U/L (ref 0–35)
AST: 19 U/L (ref 0–37)
Albumin: 4.1 g/dL (ref 3.5–5.2)
Alkaline Phosphatase: 65 U/L (ref 39–117)
BUN: 11 mg/dL (ref 6–23)
CO2: 29 meq/L (ref 19–32)
Calcium: 8.9 mg/dL (ref 8.4–10.5)
Chloride: 103 meq/L (ref 96–112)
Creatinine, Ser: 0.76 mg/dL (ref 0.40–1.20)
GFR: 76.18 mL/min (ref >60.00)
Glucose, Bld: 112 mg/dL — ABNORMAL HIGH (ref 70–99)
Potassium: 3.3 meq/L — ABNORMAL LOW (ref 3.5–5.1)
Sodium: 142 meq/L (ref 135–145)
Total Bilirubin: 0.6 mg/dL (ref 0.2–1.2)
Total Protein: 6.6 g/dL (ref 6.0–8.3)

## 2024-01-28 LAB — CBC WITH DIFFERENTIAL/PLATELET
Basophils Absolute: 0 K/uL (ref 0.0–0.1)
Basophils Relative: 0.6 % (ref 0.0–3.0)
Eosinophils Absolute: 0.2 K/uL (ref 0.0–0.7)
Eosinophils Relative: 3.5 % (ref 0.0–5.0)
HCT: 37.5 % (ref 36.0–46.0)
Hemoglobin: 12.5 g/dL (ref 12.0–15.0)
Lymphocytes Relative: 48.7 % — ABNORMAL HIGH (ref 12.0–46.0)
Lymphs Abs: 2.3 K/uL (ref 0.7–4.0)
MCHC: 33.2 g/dL (ref 30.0–36.0)
MCV: 89.3 fl (ref 78.0–100.0)
Monocytes Absolute: 0.4 K/uL (ref 0.1–1.0)
Monocytes Relative: 7.6 % (ref 3.0–12.0)
Neutro Abs: 1.9 K/uL (ref 1.4–7.7)
Neutrophils Relative %: 39.6 % — ABNORMAL LOW (ref 43.0–77.0)
Platelets: 228 K/uL (ref 150.0–400.0)
RBC: 4.2 Mil/uL (ref 3.87–5.11)
RDW: 14.3 % (ref 11.5–15.5)
WBC: 4.8 K/uL (ref 4.0–10.5)

## 2024-01-28 LAB — LIPID PANEL
Cholesterol: 141 mg/dL (ref 0–200)
HDL: 49.6 mg/dL (ref >39.00)
LDL Cholesterol: 77 mg/dL (ref 0–99)
NonHDL: 91.72
Total CHOL/HDL Ratio: 3
Triglycerides: 73 mg/dL (ref 0.0–149.0)
VLDL: 14.6 mg/dL (ref 0.0–40.0)

## 2024-01-28 LAB — VITAMIN D 25 HYDROXY (VIT D DEFICIENCY, FRACTURES): VITD: 38.18 ng/mL (ref 30.00–100.00)

## 2024-01-28 LAB — TSH: TSH: 0.04 u[IU]/mL — ABNORMAL LOW (ref 0.35–5.50)

## 2024-01-30 NOTE — Patient Instructions (Addendum)
 SURGICAL WAITING ROOM VISITATION Patients having surgery or a procedure may have no more than 2 support people in the waiting area - these visitors may rotate in the visitor waiting room.   Due to an increase in RSV and influenza rates and associated hospitalizations, children ages 33 and under may not visit patients in [facility] hospitals. If the patient needs to stay at the hospital during part of their recovery, the visitor guidelines for inpatient rooms apply.  PRE-OP VISITATION  Pre-op nurse will coordinate an appropriate time for 1 support person to accompany the patient in pre-op.  This support person may not rotate.  This visitor will be contacted when the time is appropriate for the visitor to come back in the pre-op area.  Please refer to the [facility] website for the visitor guidelines for Inpatients (after your surgery is over and you are in a regular room).  You are not required to quarantine at this time prior to your surgery. However, you must do this: Hand Hygiene often Do NOT share personal items Notify your provider if you are in close contact with someone who has COVID or you develop fever 100.4 or greater, new onset of sneezing, cough, sore throat, shortness of breath or body aches.  If you test positive for Covid or have been in contact with anyone that has tested positive in the last 10 days please notify you surgeon.    Your procedure is scheduled on: 02/14/24   Report to [facility] Main Entrance: [place] entrance where the [employer] is available.   Report to admitting at: 8:00 AM  Call this number if you have any questions or problems the morning of surgery [phone]  FOLLOW ANY ADDITIONAL PRE OP INSTRUCTIONS YOU RECEIVED FROM YOUR SURGEON'S OFFICE!!!  Do not eat food or drink fluids after Midnight the night prior to your surgery/procedure.   Oral Hygiene is also important to reduce your risk of infection.        Remember - BRUSH YOUR TEETH  THE MORNING OF SURGERY WITH YOUR REGULAR TOOTHPASTE  Do NOT smoke after Midnight the night before surgery.  STOP TAKING all Vitamins, Herbs and supplements 1 week before your surgery.   Take ONLY these medicines the morning of surgery with A SIP OF WATER: metoprolol ,amlodipine .Levocetirizine as needed.                   You may not have any metal on your body including hair pins, jewelry, and body piercing  Do not wear make-up, lotions, powders, perfumes / cologne, or deodorant  Do not wear nail polish including gel and S&S, artificial / acrylic nails, or any other type of covering on natural nails including finger and toenails. If you have artificial nails, gel coating, etc., that needs to be removed by a nail salon, Please have this removed prior to surgery. Not doing so may mean that your surgery could be cancelled or delayed if the Surgeon or anesthesia staff feels like they are unable to monitor you safely.   Do not shave 48 hours prior to surgery to avoid nicks in your skin which may contribute to postoperative infections.   Contacts, Hearing Aids, dentures or bridgework may not be worn into surgery. DENTURES WILL BE REMOVED PRIOR TO SURGERY PLEASE DO NOT APPLY Poly grip OR ADHESIVES!!!  You may bring a small overnight bag with you on the day of surgery, only pack items that are not valuable. [facility] IS NOT RESPONSIBLE  FOR VALUABLES THAT ARE LOST OR STOLEN.   Patients discharged on the day of surgery will not be allowed to drive home.  Someone NEEDS to stay with you for the first 24 hours after anesthesia.  Do not bring your home medications to the hospital. The Pharmacy will dispense medications listed on your medication list to you during your admission in the Hospital.  Special Instructions: Bring a copy of your healthcare power of attorney and living will documents the day of surgery, if you wish to have them scanned into your [facility] Records- EPIC  Please read  over the following fact sheets you were given: IF YOU HAVE QUESTIONS ABOUT YOUR PRE-OP INSTRUCTIONS, PLEASE CALL [phone]   [facility] - Preparing for Surgery      Before surgery, you can play an important role.  Because skin is not sterile, your skin needs to be as free of germs as possible.  You can reduce the number of germs on your skin by washing with CHG (chlorahexidine gluconate) soap before surgery.  CHG is an antiseptic cleaner which kills germs and bonds with the skin to continue killing germs even after washing. Please DO NOT use if you have an allergy to CHG or antibacterial soaps.  If your skin becomes reddened/irritated stop using the CHG and inform your nurse when you arrive at Short Stay. Do not shave (including legs and underarms) for at least 48 hours prior to the first CHG shower.  You may shave your face/neck.  Please follow these instructions carefully:  1.  Shower with CHG Soap the night before surgery ONLY (DO NOT USE THE SOAP THE MORNING OF SURGERY).  2.  If you choose to wash your hair, wash your hair first as usual with your normal  shampoo.  3.  After you shampoo, rinse your hair and body thoroughly to remove the shampoo.                             4.  Use CHG as you would any other liquid soap.  You can apply chg directly to the skin and wash.  Gently with a scrungie or clean washcloth.  5.  Apply the CHG Soap to your body ONLY FROM THE NECK DOWN.   Do not use on face/ open                           Wound or open sores. Avoid contact with eyes, ears mouth and genitals (private parts).                       Wash face,  Genitals (private parts) with your normal soap.             6.  Wash thoroughly, paying special attention to the area where your  surgery  will be performed.  7.  Thoroughly rinse your body with warm water from the neck down.  8.  DO NOT shower/wash with your normal soap after using and rinsing off the CHG Soap.                9.  Pat yourself dry  with a clean towel.            10.  Wear clean pajamas.            11.  Place clean sheets on your bed the night of  your first shower and do not  sleep with pets.  Day of Surgery : Do not apply any CHG, lotions/deodorants the morning of surgery.  Please wear clean clothes to the hospital/surgery center.   FAILURE TO FOLLOW THESE INSTRUCTIONS MAY RESULT IN THE CANCELLATION OF YOUR SURGERY  PATIENT SIGNATURE_________________________________  NURSE SIGNATURE__________________________________  ________________________________________________________________________

## 2024-01-31 ENCOUNTER — Other Ambulatory Visit: Payer: Self-pay

## 2024-01-31 ENCOUNTER — Encounter (HOSPITAL_COMMUNITY)
Admission: RE | Admit: 2024-01-31 | Discharge: 2024-01-31 | Disposition: A | Discharge status: Home / Self Care | Source: Ambulatory Visit | Attending: General Surgery | Admitting: General Surgery

## 2024-01-31 ENCOUNTER — Encounter (HOSPITAL_COMMUNITY): Payer: Self-pay

## 2024-01-31 VITALS — BP 151/79 | HR 86 | Temp 97.8°F | Ht 65.5 in | Wt 156.0 lb

## 2024-01-31 DIAGNOSIS — I10 Essential (primary) hypertension: Secondary | ICD-10-CM | POA: Insufficient documentation

## 2024-01-31 DIAGNOSIS — Z01818 Encounter for other preprocedural examination: Secondary | ICD-10-CM | POA: Insufficient documentation

## 2024-01-31 LAB — CBC
HCT: 36.3 % (ref 36.0–46.0)
Hemoglobin: 12.3 g/dL (ref 12.0–15.0)
MCH: 29.7 pg (ref 26.0–34.0)
MCHC: 33.9 g/dL (ref 30.0–36.0)
MCV: 87.7 fL (ref 80.0–100.0)
Platelets: 230 K/uL (ref 150–400)
RBC: 4.14 MIL/uL (ref 3.87–5.11)
RDW: 14.1 % (ref 11.5–15.5)
WBC: 4.6 K/uL (ref 4.0–10.5)
nRBC: 0 % (ref 0.0–0.2)

## 2024-01-31 LAB — BASIC METABOLIC PANEL WITH GFR
Anion gap: 10 (ref 5–15)
BUN: 11 mg/dL (ref 8–23)
CO2: 29 mmol/L (ref 22–32)
Calcium: 9.7 mg/dL (ref 8.9–10.3)
Chloride: 103 mmol/L (ref 98–111)
Creatinine, Ser: 0.8 mg/dL (ref 0.44–1.00)
GFR, Estimated: >60 mL/min (ref >60)
Glucose, Bld: 77 mg/dL (ref 70–99)
Potassium: 3.6 mmol/L (ref 3.5–5.1)
Sodium: 142 mmol/L (ref 135–145)

## 2024-01-31 NOTE — Progress Notes (Signed)
 For Anesthesia: PCP - [place], Laine LABOR, MD  Cardiologist - N/A  Bowel Prep reminder:  Chest x-ray -  EKG - 01/31/24 Stress Test -  ECHO -  Cardiac Cath -  Pacemaker/ICD device last checked: Pacemaker orders received: Device Rep notified:  Spinal Cord Stimulator:N/A  Sleep Study - N/A CPAP -   Fasting Blood Sugar - N/A Checks Blood Sugar _____ times a day Date and result of last Hgb A1c-  Last dose of GLP1 agonist- N/A GLP1 instructions: Hold 7 days prior to schedule (Hold 24 hours-daily)   Last dose of SGLT-2 inhibitors- N/A SGLT-2 instructions: Hold 72 hours prior to surgery  Blood Thinner Instructions:N/A Last Dose: Time last taken:  Aspirin Instructions:N/A Last Dose: Time last taken:  Activity level: Can go up a flight of stairs and activities of daily living without stopping and without chest pain and/or shortness of breath   Able to exercise without chest pain and/or shortness of breath    Anesthesia review:   Patient denies shortness of breath, fever, cough and chest pain at PAT appointment   Patient verbalized understanding of instructions that were reviewed over the telephone.

## 2024-02-04 ENCOUNTER — Ambulatory Visit: Payer: Medicare Other | Admitting: Family Medicine

## 2024-02-04 ENCOUNTER — Encounter: Payer: Self-pay | Admitting: Family Medicine

## 2024-02-04 VITALS — BP 128/84 | HR 65 | Temp 97.2°F | Ht 64.0 in | Wt 156.4 lb

## 2024-02-04 DIAGNOSIS — R7989 Other specified abnormal findings of blood chemistry: Secondary | ICD-10-CM

## 2024-02-04 DIAGNOSIS — M810 Age-related osteoporosis without current pathological fracture: Secondary | ICD-10-CM

## 2024-02-04 DIAGNOSIS — E039 Hypothyroidism, unspecified: Secondary | ICD-10-CM

## 2024-02-04 DIAGNOSIS — Z0000 Encounter for general adult medical examination without abnormal findings: Secondary | ICD-10-CM

## 2024-02-04 DIAGNOSIS — I10 Essential (primary) hypertension: Secondary | ICD-10-CM | POA: Diagnosis not present

## 2024-02-04 DIAGNOSIS — Z23 Encounter for immunization: Secondary | ICD-10-CM | POA: Diagnosis not present

## 2024-02-04 DIAGNOSIS — Z1211 Encounter for screening for malignant neoplasm of colon: Secondary | ICD-10-CM

## 2024-02-04 DIAGNOSIS — E559 Vitamin D deficiency, unspecified: Secondary | ICD-10-CM

## 2024-02-04 MED ORDER — HYDROCHLOROTHIAZIDE 25 MG PO TABS: 25.0000 mg | ORAL_TABLET | Freq: Every day | ORAL | 3 refills | Status: AC

## 2024-02-04 MED ORDER — AMLODIPINE BESYLATE 5 MG PO TABS: 5.0000 mg | ORAL_TABLET | Freq: Every day | ORAL | 3 refills | Status: AC

## 2024-02-04 MED ORDER — METOPROLOL SUCCINATE ER 100 MG PO TB24: ORAL_TABLET | ORAL | 3 refills | Status: AC

## 2024-02-04 NOTE — Assessment & Plan Note (Signed)
 bp in fair control at this time  BP Readings from Last 1 Encounters:  02/04/24 128/84   No changes needed Most recent labs reviewed  Disc lifstyle change with low sodium diet and exercise  Continues amlodipine  5 mg daily  Hydrochlorothiazide  25 mg daily  Metoprolol  xl  100 mg daily

## 2024-02-04 NOTE — Assessment & Plan Note (Addendum)
 Colonoscopy 2017   Is planning hemorrhoid surgery soon

## 2024-02-04 NOTE — Patient Instructions (Addendum)
 If you are interested in the new shingles vaccine (Shingrix) - call your local pharmacy to check on coverage and availability   Please schedule the AMW at check out (phone interview)    Take care of yourself    Keep up the good work with diet and exercise   Flu shot today

## 2024-02-04 NOTE — Assessment & Plan Note (Signed)
 Last vitamin D  Lab Results  Component Value Date   VD25OH 38.18 01/28/2024   Vitamin D  level is therapeutic with current supplementation Disc importance of this to bone and overall health

## 2024-02-04 NOTE — Assessment & Plan Note (Addendum)
 Reviewed health habits including diet and exercise and skin cancer prevention Reviewed appropriate screening tests for age  Also reviewed health mt list, fam hx and immunization status , as well as social and family history   See HPI Labs reviewed and ordered Health Maintenance  Topic Date Due   Osteoporosis screening with Bone Density Scan  01/25/2015   Medicare Annual Wellness Visit  02/04/2024   Breast Cancer Screening  02/06/2024   COVID-19 Vaccine (3 - Moderna risk series) 02/16/2025*   Zoster (Shingles) Vaccine (1 of 2) 05/06/2025*   Hepatitis C Screening  01/12/2028*   DTaP/Tdap/Td vaccine (4 - Td or Tdap) 12/15/2025   Pneumococcal Vaccine for age over 78  Completed   Flu Shot  Completed   Meningitis B Vaccine  Aged Out   Colon Cancer Screening  Discontinued  *Topic was postponed. The date shown is not the original due date.    Flu shot today  Interested in shingrix from pharmacy  Declines dexa or osteoporosis treatment No falls or fractures  Discussed fall prevention, supplements and exercise for bone density  PHQ 2 after grief/loss- thinks she is doing ok Good health habits

## 2024-02-04 NOTE — Assessment & Plan Note (Addendum)
 Declines another DEXA or any treatment (last dexa 2014)  No falls or fracture  Good D level  Discussed fall prevention, supplements and exercise for bone density

## 2024-02-04 NOTE — Assessment & Plan Note (Signed)
 TSH is low /stable  No longer on thyroid  replacement , seeing endocrinology She has follow up there soon Reviewed note from april

## 2024-02-04 NOTE — Assessment & Plan Note (Signed)
 Continues follow up with endocrinology  Lab Results  Component Value Date   TSH 0.04 (L) 01/28/2024

## 2024-02-04 NOTE — Progress Notes (Signed)
 Subjective:    Patient ID: Lyndel Loraine Taflinger, female    DOB: Oct 26, 1945, 78 y.o.   MRN: [id number]  HPI  Here for health maintenance exam and to review chronic medical problems   Wt Readings from Last 3 Encounters:  02/04/24 156 lb 6 oz (70.9 kg)  01/31/24 156 lb (70.8 kg)  02/02/23 154 lb 6 oz (70 kg)   26.84 kg/m  Vitals:   02/04/24 0824  BP: 128/84  Pulse: 65  Temp: (!) 97.2 F (36.2 C)  SpO2: 99%    Immunization History  Administered Date(s) Administered   INFLUENZA, HIGH DOSE SEASONAL PF 02/04/2024   Influenza Split 02/20/2011   Influenza Whole 12/20/2005   Influenza, Seasonal, Injecte, Preservative Fre 02/19/2012, 02/02/2023   Influenza,inj,Quad PF,6+ Mos 12/31/2012, 05/01/2014, 12/22/2014, 11/19/2017, 12/01/2018, 12/17/2019, 01/14/2021, 11/25/2021   Influenza-Unspecified 11/23/2016   Moderna SARS-COV2 Booster Vaccination 03/23/2020   Moderna Sars-Covid-2 Vaccination 04/15/2019, 05/14/2019   Pneumococcal Conjugate-13 12/22/2014   Pneumococcal Polysaccharide-23 12/31/2012   Td 03/14/1999, 07/03/2006   Tdap 12/16/2015   Zoster, Live 03/24/2011    Health Maintenance Due  Topic Date Due   Bone Density Scan  01/25/2015   Medicare Annual Wellness (AWV)  02/04/2024   Mammogram  02/06/2024   Flu shot -today   Zostavax in 2013  May be interested in shingrix from pharmacy    Mammogram 01/2023 - has it scheduled dec 2 Self breast exam-no lumps  Breasts are less dense than they used to be   Gyn health Hysterectomy   Colon cancer screening  Colonoscopy 2017  Has to have hemorrhoid surgery again - had ? A pre cancerous lesion on it   Bone health  Dexa  2014 Osteoporosis  Declines another dexa or other treatment  Falls- none  Fractures vitamin D   She started B12 next week and feels better on it  Supplements -none  Last vitamin D  Lab Results  Component Value Date   VD25OH 38.18 01/28/2024    Exercise  [employer] Elliptical machine   Pool Strength classes  Some yoga also  Walking    Mood    02/04/2024    9:18 AM 02/02/2023    8:28 AM 09/26/2022   12:44 PM 01/30/2022    9:33 AM 07/11/2021    9:55 AM  Depression screen PHQ 2/9  Decreased Interest 0 0 0 0 0  Down, Depressed, Hopeless 1 0 0 0 0  PHQ - 2 Score 1 0 0 0 0  Altered sleeping 0 0 0    Tired, decreased energy 1 0 0    Change in appetite 0 0 0    Feeling bad or failure about yourself  0 0 0    Trouble concentrating 0 0 0    Moving slowly or fidgety/restless 0 0 0    Suicidal thoughts 0 0 0    PHQ-9 Score 2 0  0     Difficult doing work/chores Not difficult at all Not difficult at all Not difficult at all       Data saved with a previous flowsheet row definition   Some grief /lost her sister in May  Some stress but dealing well    HTN bp is stable today  No cp or palpitations or headaches or edema  No side effects to medicines  BP Readings from Last 3 Encounters:  02/04/24 128/84  01/31/24 (!) 151/79  02/02/23 130/75     Lab Results  Component Value Date   NA 142 01/31/2024  K 3.6 01/31/2024   CO2 29 01/31/2024   GLUCOSE 77 01/31/2024   BUN 11 01/31/2024   CREATININE 0.80 01/31/2024   CALCIUM 9.7 01/31/2024   GFR 76.18 01/28/2024   GFRNONAA >60 01/31/2024  amlodipine  5 mg daily  Hydrochlorothiazide  25 mg daily  Metoprolol  xl  100 mg daily   Has hypertensive eye dz in past   Hypothyroidism  Pt has no clinical changes No change in energy level/ hair or skin/ edema and no tremor Lab Results  Component Value Date   TSH 0.04 (L) 01/28/2024   It was 0.043 at endo office in April with normal Free T4 and T3 Monitoring  No thyroid  replacement Sees endocrinology -has appointment in December   Cholesterol Lab Results  Component Value Date   CHOL 141 01/28/2024   CHOL 151 01/26/2023   CHOL 149 01/30/2022   Lab Results  Component Value Date   HDL 49.60 01/28/2024   HDL 41.30 01/26/2023   HDL 50.80 01/30/2022   Lab  Results  Component Value Date   LDLCALC 77 01/28/2024   LDLCALC 93 01/26/2023   LDLCALC 82 01/30/2022   Lab Results  Component Value Date   TRIG 73.0 01/28/2024   TRIG 81.0 01/26/2023   TRIG 81.0 01/30/2022   Lab Results  Component Value Date   CHOLHDL 3 01/28/2024   CHOLHDL 4 01/26/2023   CHOLHDL 3 01/30/2022   No results found for: LDLDIRECT  The 10-year ASCVD risk score (Arnett DK, et al., 2019) is: 11.3%   Values used to calculate the score:     Age: 14 years     Clincally relevant sex: Female     Is Non-Hispanic African American: Yes     Diabetic: No     Tobacco smoker: No     Systolic Blood Pressure: 128 mmHg     Is BP treated: Yes     HDL Cholesterol: 49.6 mg/dL     Total Cholesterol: 141 mg/dL  Eats healthy- even better better  Does eat some granola   Lab Results  Component Value Date   ALT 11 01/28/2024   AST 19 01/28/2024   ALKPHOS 65 01/28/2024   BILITOT 0.6 01/28/2024   Lab Results  Component Value Date   WBC 4.6 01/31/2024   HGB 12.3 01/31/2024   HCT 36.3 01/31/2024   MCV 87.7 01/31/2024   PLT 230 01/31/2024       Patient Active Problem List   Diagnosis Date Noted   Family history of heart disease 09/26/2022   Low TSH level 01/31/2022   Skin lesion of face 07/11/2021   Vitamin D  deficiency 01/27/2021   Rectal bleeding 04/21/2019   Internal hemorrhoids 04/27/2016   Hypothyroid 01/13/2016   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   Colon cancer screening 12/22/2014   Hypertensive retinopathy of both eyes, grade 1 12/16/2013   Routine general medical examination at a health care facility 02/20/2011   MENINGIOMA 11/17/2009   Osteoporosis 03/19/2007   HYPOGLYCEMIA 06/12/2006   Essential hypertension 06/12/2006   Allergic rhinitis 06/12/2006   GERD 06/12/2006   Past Medical History:  Diagnosis Date   Anemia    Arthritis    in hips   GERD (gastroesophageal reflux disease)    History of hypothyroidism    no current meds   HTN (hypertension)     echo (8/11): EF 55%, mild MR, mild TR   Nasal pruritis    full body w/o rash    Neuromuscular disorder (HCC)    cramps occ  after working out   Nonallergic rhinitis    Osteoporosis    Psoriasis    mild intermittent    Thyroid  disease    Tingling    in hands of ? etiol   Past Surgical History:  Procedure Laterality Date   2D echo  10/2009   nml    ABDOMINAL HYSTERECTOMY  1975   with partial 1 ovary removed   ABDOMINAL SURGERY  1976   adhesions   CESAREAN SECTION  1971   COLONOSCOPY  2006   dexa - osteopenia  2005   dexa 2009 - osteoporosis/fairly stable    hosp TIA  10/2009   no problems since, told in hospital not tia   NASAL SINUS SURGERY  2002   OOPHORECTOMY     ORIF FINGER / THUMB FRACTURE Right 02/2005   ovary removed  1976   removal of internal stitch that did not disolve  1977   RETINAL DETACHMENT SURGERY Left 10/25/2020   11/25/20   TONSILLECTOMY     TRANSANAL HEMORRHOIDAL DEARTERIALIZATION N/A 08/22/2019   Procedure: TRANSANAL HEMORRHOIDAL DEARTERIALIZATION;  Surgeon: Debby Hila, MD;  Location: [facility];  Service: General;  Laterality: N/A;   urethral stricutre  1989 or 1990   dilated   VESICOVAGINAL FISTULA CLOSURE W/ TAH  1975   Social History   Tobacco Use   Smoking status: Former    Current packs/day: 0.00    Types: Cigarettes    Start date: 04/07/1982    Quit date: 04/08/1987    Years since quitting: 36.8   Smokeless tobacco: Never   Tobacco comments:    1-2 cig per day  Vaping Use   Vaping status: Never Used  Substance Use Topics   Alcohol use: Yes    Comment: occasional   Drug use: No   Family History  Problem Relation Age of Onset   Heart disease Mother        MI late 40s - CHF    Hypertension Mother    Cancer Father        throat CA   Diabetes Father    Heart disease Sister        CHF   Diabetes Sister    Stroke Brother    Diabetes Brother    Hypertension Brother    Diabetes Brother    Allergies  Allergen  Reactions   Amlodipine  Swelling    Ankle swelling   Azithromycin      Heart palpitations on z pack   Levaquin  [Levofloxacin  In D5w] Other (See Comments)    Muscle pain    Losartan      The 100 mg pill caused some back pain   (? Also a little dizziness and possibly headache)   Penicillins     REACTION: urticaria (hives), knots in abdomen easy bruising   Shrimp [Shellfish Allergy]     Throat tries to close   Sulfonamide Derivatives     REACTION: tingling around mouth and hands felt tight   Povidone-Iodine Hives and Itching   Current Outpatient Medications on File Prior to Visit  Medication Sig Dispense Refill   Cholecalciferol (VITAMIN D3) 1000 UNITS CAPS Take 1,000 Units by mouth daily.     cyanocobalamin  (VITAMIN B12) 1000 MCG tablet Take 1,000 mcg by mouth daily.     cyclobenzaprine  (FLEXERIL ) 10 MG tablet TAKE 1/2-1 TABLET BY MOUTH 3 TIMES DAILY AS NEEDED FOR MUSCLE SPASMS (WATCH FOR SEDATION) 30 tablet 3   Ferrous Sulfate 250 MG CPCR Take 1  capsule by mouth daily.     fluticasone  (FLONASE ) 50 MCG/ACT nasal spray Place 2 sprays into both nostrils daily as needed for allergies or rhinitis. 48 g 3   Homeopathic Products (SIMILASAN DRY EYE RELIEF OP) Place 1 drop into both eyes 2 (two) times daily as needed (dry eyes).     levocetirizine (XYZAL ) 5 MG tablet Take 1 tablet (5 mg total) by mouth daily as needed. (Patient taking differently: Take 5 mg by mouth daily as needed for allergies.) 90 tablet 3   magnesium 30 MG tablet Take 30 mg by mouth every other day.     Multiple Vitamin (MULTIVITAMIN) tablet Take 1 tablet by mouth daily.     No current facility-administered medications on file prior to visit.    Review of Systems  Constitutional:  Negative for activity change, appetite change, fatigue, fever and unexpected weight change.  HENT:  Negative for congestion, ear pain, rhinorrhea, sinus pressure and sore throat.   Eyes:  Negative for pain, redness and visual disturbance.   Respiratory:  Negative for cough, shortness of breath and wheezing.   Cardiovascular:  Negative for chest pain and palpitations.  Gastrointestinal:  Negative for abdominal pain, blood in stool, constipation and diarrhea. Abdominal distention: hemorrhoid issue. Endocrine: Negative for polydipsia and polyuria.  Genitourinary:  Negative for dysuria, frequency and urgency.  Musculoskeletal:  Negative for arthralgias, back pain and myalgias.  Skin:  Negative for pallor and rash.  Allergic/Immunologic: Negative for environmental allergies.  Neurological:  Negative for dizziness, syncope and headaches.  Hematological:  Negative for adenopathy. Does not bruise/bleed easily.  Psychiatric/Behavioral:  Negative for decreased concentration and dysphoric mood. The patient is not nervous/anxious.        Grief -getting over        Objective:   Physical Exam Constitutional:      General: She is not in acute distress.    Appearance: Normal appearance. She is well-developed and normal weight. She is not ill-appearing or diaphoretic.  HENT:     Head: Normocephalic and atraumatic.     Right Ear: Tympanic membrane, ear canal and external ear normal.     Left Ear: Tympanic membrane, ear canal and external ear normal.     Nose: Nose normal. No congestion.     Mouth/Throat:     Mouth: Mucous membranes are moist.     Pharynx: Oropharynx is clear. No posterior oropharyngeal erythema.  Eyes:     General: No scleral icterus.    Extraocular Movements: Extraocular movements intact.     Conjunctiva/sclera: Conjunctivae normal.     Pupils: Pupils are equal, round, and reactive to light.  Neck:     Thyroid : No thyromegaly.     Vascular: No carotid bruit or JVD.  Cardiovascular:     Rate and Rhythm: Normal rate and regular rhythm.     Pulses: Normal pulses.     Heart sounds: Normal heart sounds.     No gallop.  Pulmonary:     Effort: Pulmonary effort is normal. No respiratory distress.     Breath sounds:  Normal breath sounds. No wheezing.     Comments: Good air exch Chest:     Chest wall: No tenderness.  Abdominal:     General: Bowel sounds are normal. There is no distension or abdominal bruit.     Palpations: Abdomen is soft. There is no mass.     Tenderness: There is no abdominal tenderness.     Hernia: No hernia is present.  Genitourinary:    Comments: Breast exam: No mass, nodules, thickening, tenderness, bulging, retraction, inflamation, nipple discharge or skin changes noted.  No axillary or clavicular LA.     Musculoskeletal:        General: No tenderness. Normal range of motion.     Cervical back: Normal range of motion and neck supple. No rigidity. No muscular tenderness.     Right lower leg: No edema.     Left lower leg: No edema.     Comments: No kyphosis   Lymphadenopathy:     Cervical: No cervical adenopathy.  Skin:    General: Skin is warm and dry.     Coloration: Skin is not pale.     Findings: No erythema or rash.     Comments: Few skin tags   Neurological:     Mental Status: She is alert. Mental status is at baseline.     Cranial Nerves: No cranial nerve deficit.     Motor: No abnormal muscle tone.     Coordination: Coordination normal.     Gait: Gait normal.     Deep Tendon Reflexes: Reflexes are normal and symmetric. Reflexes normal.  Psychiatric:        Mood and Affect: Mood normal.        Cognition and Memory: Cognition and memory normal.           Assessment & Plan:   Problem List Items Addressed This Visit       Cardiovascular and Mediastinum   Essential hypertension   bp in fair control at this time  BP Readings from Last 1 Encounters:  02/04/24 128/84   No changes needed Most recent labs reviewed  Disc lifstyle change with low sodium diet and exercise  Continues amlodipine  5 mg daily  Hydrochlorothiazide  25 mg daily  Metoprolol  xl  100 mg daily         Relevant Medications   amLODipine  (NORVASC ) 5 MG tablet   hydrochlorothiazide   (HYDRODIURIL ) 25 MG tablet   metoprolol  succinate (TOPROL -XL) 100 MG 24 hr tablet     Endocrine   Hypothyroid   TSH is low /stable  No longer on thyroid  replacement , seeing endocrinology She has follow up there soon Reviewed note from april      Relevant Medications   metoprolol  succinate (TOPROL -XL) 100 MG 24 hr tablet     Musculoskeletal and Integument   Osteoporosis   Declines another DEXA or any treatment (last dexa 2014)  No falls or fracture  Good D level  Discussed fall prevention, supplements and exercise for bone density          Other   Vitamin D  deficiency   Routine general medical examination at a health care facility - Primary   Reviewed health habits including diet and exercise and skin cancer prevention Reviewed appropriate screening tests for age  Also reviewed health mt list, fam hx and immunization status , as well as social and family history   See HPI Labs reviewed and ordered Health Maintenance  Topic Date Due   Osteoporosis screening with Bone Density Scan  01/25/2015   Medicare Annual Wellness Visit  02/04/2024   Breast Cancer Screening  02/06/2024   COVID-19 Vaccine (3 - Moderna risk series) 02/16/2025*   Zoster (Shingles) Vaccine (1 of 2) 05/06/2025*   Hepatitis C Screening  01/12/2028*   DTaP/Tdap/Td vaccine (4 - Td or Tdap) 12/15/2025   Pneumococcal Vaccine for age over 40  Completed   Flu Shot  Completed   Meningitis B Vaccine  Aged Out   Colon Cancer Screening  Discontinued  *Topic was postponed. The date shown is not the original due date.    Flu shot today  Interested in shingrix from pharmacy  Declines dexa or osteoporosis treatment No falls or fractures  Discussed fall prevention, supplements and exercise for bone density  PHQ 0 Good health habits       Low TSH level   Continues follow up with endocrinology  Lab Results  Component Value Date   TSH 0.04 (L) 01/28/2024         Colon cancer screening   Colonoscopy 2017    Is planning hemorrhoid surgery soon      Other Visit Diagnoses       Need for influenza vaccination       Relevant Orders   Flu vaccine HIGH DOSE PF(Fluzone Trivalent) (Completed)

## 2024-02-12 LAB — HM MAMMOGRAPHY

## 2024-02-14 ENCOUNTER — Encounter (HOSPITAL_COMMUNITY): Admission: RE | Disposition: A | Payer: Self-pay | Source: Home / Self Care | Attending: General Surgery

## 2024-02-14 ENCOUNTER — Ambulatory Visit (HOSPITAL_COMMUNITY): Payer: Self-pay | Admitting: Physician Assistant

## 2024-02-14 ENCOUNTER — Encounter (HOSPITAL_COMMUNITY): Payer: Self-pay | Admitting: General Surgery

## 2024-02-14 ENCOUNTER — Ambulatory Visit (HOSPITAL_COMMUNITY): Admitting: Anesthesiology

## 2024-02-14 ENCOUNTER — Other Ambulatory Visit: Payer: Self-pay

## 2024-02-14 ENCOUNTER — Ambulatory Visit (HOSPITAL_COMMUNITY)
Admission: RE | Admit: 2024-02-14 | Discharge: 2024-02-14 | Disposition: A | Attending: General Surgery | Admitting: General Surgery

## 2024-02-14 DIAGNOSIS — I10 Essential (primary) hypertension: Secondary | ICD-10-CM | POA: Diagnosis not present

## 2024-02-14 DIAGNOSIS — Z87891 Personal history of nicotine dependence: Secondary | ICD-10-CM | POA: Diagnosis not present

## 2024-02-14 DIAGNOSIS — K642 Third degree hemorrhoids: Secondary | ICD-10-CM | POA: Diagnosis not present

## 2024-02-14 DIAGNOSIS — E039 Hypothyroidism, unspecified: Secondary | ICD-10-CM

## 2024-02-14 HISTORY — PX: HEMORRHOID SURGERY: SHX153

## 2024-02-14 SURGERY — HEMORRHOIDECTOMY
Anesthesia: Monitor Anesthesia Care

## 2024-02-14 MED ORDER — OXYCODONE HCL 5 MG PO TABS: 5.0000 mg | ORAL_TABLET | Freq: Four times a day (QID) | ORAL | 0 refills | Status: AC | PRN

## 2024-02-14 MED ORDER — FENTANYL CITRATE (PF) 100 MCG/2ML IJ SOLN
INTRAMUSCULAR | Status: AC
  Filled 2024-02-14: qty 2

## 2024-02-14 MED ORDER — ONDANSETRON HCL 4 MG/2ML IJ SOLN
INTRAMUSCULAR | Status: AC
  Filled 2024-02-14: qty 2

## 2024-02-14 MED ORDER — KETAMINE HCL 50 MG/5ML IJ SOSY
PREFILLED_SYRINGE | INTRAMUSCULAR | Status: DC | PRN
  Administered 2024-02-14: 10 mg via INTRAVENOUS

## 2024-02-14 MED ORDER — DEXMEDETOMIDINE HCL IN NACL 80 MCG/20ML IV SOLN
INTRAVENOUS | Status: AC
  Filled 2024-02-14: qty 20

## 2024-02-14 MED ORDER — FENTANYL CITRATE (PF) 100 MCG/2ML IJ SOLN
INTRAMUSCULAR | Status: DC | PRN
  Administered 2024-02-14: 50 ug via INTRAVENOUS

## 2024-02-14 MED ORDER — KETAMINE HCL 50 MG/5ML IJ SOSY
PREFILLED_SYRINGE | INTRAMUSCULAR | Status: AC
  Filled 2024-02-14: qty 5

## 2024-02-14 MED ORDER — LACTATED RINGERS IV SOLN: INTRAVENOUS | Status: DC

## 2024-02-14 MED ORDER — DEXMEDETOMIDINE HCL IN NACL 80 MCG/20ML IV SOLN
INTRAVENOUS | Status: DC | PRN
  Administered 2024-02-14 (×3): 4 ug via INTRAVENOUS

## 2024-02-14 MED ORDER — LACTATED RINGERS IV SOLN: INTRAVENOUS | Status: DC | PRN

## 2024-02-14 MED ORDER — GABAPENTIN 300 MG PO CAPS
300.0000 mg | ORAL_CAPSULE | ORAL | Status: AC
  Administered 2024-02-14: 300 mg via ORAL
  Filled 2024-02-14: qty 1

## 2024-02-14 MED ORDER — 0.9 % SODIUM CHLORIDE (POUR BTL) OPTIME
TOPICAL | Status: DC | PRN
  Administered 2024-02-14: 1000 mL

## 2024-02-14 MED ORDER — BUPIVACAINE-EPINEPHRINE (PF) 0.5% -1:200000 IJ SOLN: INTRAMUSCULAR | Status: DC | PRN

## 2024-02-14 MED ORDER — PROPOFOL 1000 MG/100ML IV EMUL
INTRAVENOUS | Status: AC
  Filled 2024-02-14: qty 100

## 2024-02-14 MED ORDER — PROPOFOL 10 MG/ML IV BOLUS
INTRAVENOUS | Status: AC
  Filled 2024-02-14: qty 20

## 2024-02-14 MED ORDER — LIDOCAINE HCL 2 % IJ SOLN
INTRAMUSCULAR | Status: AC
  Filled 2024-02-14: qty 20

## 2024-02-14 MED ORDER — DEXAMETHASONE SOD PHOSPHATE PF 10 MG/ML IJ SOLN
INTRAMUSCULAR | Status: DC | PRN
  Administered 2024-02-14: 10 mg via INTRAVENOUS

## 2024-02-14 MED ORDER — PROPOFOL 10 MG/ML IV BOLUS
INTRAVENOUS | Status: DC | PRN
  Administered 2024-02-14: 50 mg via INTRAVENOUS
  Administered 2024-02-14: 65 ug/kg/min via INTRAVENOUS

## 2024-02-14 MED ORDER — CHLORHEXIDINE GLUCONATE 0.12 % MT SOLN
15.0000 mL | Freq: Once | OROMUCOSAL | Status: AC
  Administered 2024-02-14: 15 mL via OROMUCOSAL

## 2024-02-14 MED ORDER — ACETAMINOPHEN 500 MG PO TABS
1000.0000 mg | ORAL_TABLET | ORAL | Status: AC
  Administered 2024-02-14: 1000 mg via ORAL
  Filled 2024-02-14: qty 2

## 2024-02-14 MED ORDER — GLYCOPYRROLATE 0.2 MG/ML IJ SOLN
INTRAMUSCULAR | Status: DC | PRN
  Administered 2024-02-14: .2 mg via INTRAVENOUS

## 2024-02-14 MED ORDER — SODIUM CHLORIDE 0.9% FLUSH: 3.0000 mL | Freq: Two times a day (BID) | INTRAVENOUS | Status: DC

## 2024-02-14 MED ORDER — ONDANSETRON HCL 4 MG/2ML IJ SOLN
INTRAMUSCULAR | Status: DC | PRN
  Administered 2024-02-14: 4 mg via INTRAVENOUS

## 2024-02-14 MED ORDER — ORAL CARE MOUTH RINSE: 15.0000 mL | Freq: Once | OROMUCOSAL | Status: AC

## 2024-02-14 MED ORDER — BUPIVACAINE-EPINEPHRINE (PF) 0.5% -1:200000 IJ SOLN
INTRAMUSCULAR | Status: DC | PRN
  Administered 2024-02-14: 50 mL

## 2024-02-14 MED ORDER — LIDOCAINE HCL (PF) 2 % IJ SOLN
INTRAMUSCULAR | Status: DC | PRN
  Administered 2024-02-14: 100 mg via INTRADERMAL

## 2024-02-14 MED ORDER — BUPIVACAINE LIPOSOME 1.3 % IJ SUSP: 20.0000 mL | Freq: Once | INTRAMUSCULAR | Status: DC

## 2024-02-14 MED ORDER — BUPIVACAINE-EPINEPHRINE (PF) 0.5% -1:200000 IJ SOLN
INTRAMUSCULAR | Status: AC
  Filled 2024-02-14: qty 30

## 2024-02-14 MED ORDER — BUPIVACAINE LIPOSOME 1.3 % IJ SUSP
INTRAMUSCULAR | Status: AC
  Filled 2024-02-14: qty 20

## 2024-02-14 SURGICAL SUPPLY — 24 items
BAG COUNTER SPONGE SURGICOUNT (BAG) IMPLANT
BLADE SURG 15 STRL LF DISP TIS (BLADE) ×1 IMPLANT
BRIEF MESH DISP LRG (UNDERPADS AND DIAPERS) ×1 IMPLANT
DRAPE LAPAROTOMY T 102X78X121 (DRAPES) ×1 IMPLANT
ELECT REM PT RETURN 15FT ADLT (MISCELLANEOUS) ×1 IMPLANT
GAUZE 4X4 16PLY ~~LOC~~+RFID DBL (SPONGE) ×1 IMPLANT
GAUZE PAD ABD 8X10 STRL (GAUZE/BANDAGES/DRESSINGS) IMPLANT
GAUZE SPONGE 4X4 12PLY STRL (GAUZE/BANDAGES/DRESSINGS) IMPLANT
GLOVE BIO SURGEON STRL SZ 6.5 (GLOVE) ×1 IMPLANT
GLOVE INDICATOR 6.5 STRL GRN (GLOVE) ×2 IMPLANT
GOWN STRL REUS W/ TWL XL LVL3 (GOWN DISPOSABLE) ×3 IMPLANT
KIT BASIN OR (CUSTOM PROCEDURE TRAY) ×1 IMPLANT
KIT TURNOVER KIT A (KITS) ×1 IMPLANT
NDL HYPO 22X1.5 SAFETY MO (MISCELLANEOUS) ×1 IMPLANT
PACK BASIC VI WITH GOWN DISP (CUSTOM PROCEDURE TRAY) ×1 IMPLANT
PENCIL SMOKE EVACUATOR (MISCELLANEOUS) IMPLANT
SHEARS HARMONIC 9CM CVD (BLADE) IMPLANT
SPIKE FLUID TRANSFER (MISCELLANEOUS) ×1 IMPLANT
SPONGE SURGIFOAM ABS GEL 100 (HEMOSTASIS) IMPLANT
SURGILUBE 2OZ TUBE FLIPTOP (MISCELLANEOUS) ×1 IMPLANT
SUT CHROMIC 2 0 SH (SUTURE) IMPLANT
SUT CHROMIC 3 0 SH 27 (SUTURE) IMPLANT
SYR 20ML LL LF (SYRINGE) ×1 IMPLANT
TOWEL OR 17X26 10 PK STRL BLUE (TOWEL DISPOSABLE) ×1 IMPLANT

## 2024-02-14 NOTE — Op Note (Signed)
 02/14/2024  8:00 AM  PATIENT:  Susan Woods  78 y.o. female  Patient Care Team: Tower, Laine LABOR, MD as PCP - General Portia Fireman, [place] as Consulting Physician (Optometry) Cherilyn Debby CROME, MD as Consulting Physician (Internal Medicine)  PRE-OPERATIVE DIAGNOSIS:  GRADE 3 HEMORRHOID  POST-OPERATIVE DIAGNOSIS:  GRADE 3 HEMORRHOID  PROCEDURE:  SINGLE COLUMN HEMORRHOIDECTOMY    Surgeon(s): Debby Hila, MD  ASSISTANT: none   ANESTHESIA:   local and MAC  SPECIMEN:  Source of Specimen:  R anterior hemorrhoid  DISPOSITION OF SPECIMEN:  PATHOLOGY  COUNTS:  YES  PLAN OF CARE: Discharge to home after PACU  PATIENT DISPOSITION:  PACU - hemodynamically stable.  INDICATION: 78 y.o. F with bleeding grade 3 hemorrhoid.  I recommended hemorrhoidectomy.   OR FINDINGS: grade 3 R anterior hemorrhoid  DESCRIPTION: the patient was identified in the preoperative holding area and taken to the OR where they were laid on the operating room table.  MAC anesthesia was induced without difficulty. The patient was then positioned in prone jackknife position with buttocks gently taped apart.  The patient was then prepped and draped in usual sterile fashion.  SCDs were noted to be in place prior to the initiation of anesthesia. A surgical timeout was performed indicating the correct patient, procedure, positioning and need for preoperative antibiotics.  A rectal block was performed using Marcaine  with epinephrine  mixed with Experel.    I began with a digital rectal exam.  The anal canal was gently dilated.  I then placed a Hill-Ferguson anoscope into the anal canal and evaluated this completely.     There was a grade 3 right anterior hemorrhoid with large external component.  Remaining hemorrhoids were grade 1.  I elevated the hemorrhoid off of the anal canal using an Allis clamp.  An incision was made using a 15 blade scalpel.  Dissection was carried down to the level of the sphincter  complex, which was preserved.  The remaining hemorrhoidal tissue was resected down to the distal rectal mucosa.  A 2-0 chromic suture was used to close the internal portion of the hemorrhoidectomy.  I then cleared the external hemorrhoidal tissue from the wound and closed distal to the dentate line with a running 3-0 chromic suture.  Hemostasis was good.  Additional local anesthesia was placed around the incision for postop pain control.  A dressing was applied.  The patient was awakened from anesthesia and sent to the postanesthesia care unit in stable condition.  All counts were correct per operating room staff.  Hila JAYSON Debby, MD  Colorectal and General Surgery [facility]

## 2024-02-14 NOTE — Anesthesia Postprocedure Evaluation (Signed)
 Anesthesia Post Note  Patient: Susan Woods  Procedure(s) Performed: HEMORRHOIDECTOMY     Patient location during evaluation: PACU Anesthesia Type: MAC Level of consciousness: awake and alert Pain management: pain level controlled Vital Signs Assessment: post-procedure vital signs reviewed and stable Respiratory status: spontaneous breathing, nonlabored ventilation and respiratory function stable Cardiovascular status: blood pressure returned to baseline and stable Postop Assessment: no apparent nausea or vomiting Anesthetic complications: no   No notable events documented.  Last Vitals:  Vitals:   02/14/24 0915 02/14/24 0930  BP: 101/64   Pulse: 62 63  Resp: 11 18  Temp: (!) 36.3 C (!) 36.3 C  SpO2: 96% 96%    Last Pain:  Vitals:   02/14/24 0915  TempSrc:   PainSc: 0-No pain                 Butler Levander Pinal

## 2024-02-14 NOTE — Transfer of Care (Signed)
 Immediate Anesthesia Transfer of Care Note  Patient: Susan Woods  Procedure(s) Performed: HEMORRHOIDECTOMY  Patient Location: PACU  Anesthesia Type:MAC  Level of Consciousness: awake, alert , and oriented  Airway & Oxygen Therapy: Patient Spontanous Breathing and Patient connected to nasal cannula oxygen  Post-op Assessment: Report given to RN and Post -op Vital signs reviewed and stable  Post vital signs: Reviewed and stable  Last Vitals:  Vitals Value Taken Time  BP    Temp    Pulse 75 02/14/24 08:15  Resp 13 02/14/24 08:15  SpO2 100 % 02/14/24 08:15  Vitals shown include unfiled device data.  Last Pain:  Vitals:   02/14/24 0555  TempSrc:   PainSc: 0-No pain         Complications: No notable events documented.

## 2024-02-14 NOTE — H&P (Signed)
 PROVIDER:  BERNARDA WANDA NED, MD   MRN: [id number] DOB: [birth year]    Subjective        History of Present Illness: Susan Woods is a 78 y.o. female who underwent THD in 2021.  She reports an episode of significant rectal bleeding approximately 2 weeks ago.  It has since subsided.  She denies any pain associated with this.   Last colonoscopy was 2017.  She is due again in 2027.     Review of Systems: A complete review of systems was obtained from the patient.  I have reviewed this information and discussed as appropriate with the patient.  See HPI as well for other ROS.     Medical History:     Past Medical History:  Diagnosis Date   Anemia     Anxiety     Hypertension     Hypothyroidism        There is no problem list on file for this patient.          Past Surgical History:  Procedure Laterality Date   CESAREAN SECTION   1971   ABDOMINAL HYSTERECTOMY W/ PARTIAL VAGINACTOMY   1975   HYSTERECTOMY TOTAL ABDOMINAL W/COLPO-URETHROCYSTOPEXY   1976    also had a tubal pregnancy   LAPAROSCOPIC LYSIS INTESTINAL ADHESIONS   1976   OTHER SURGERY   1977    removal of internal stich that didn't desolve   TONSILLECTOMY   1979   REPAIR RETINAL DETACHMENT BY AIR/GAS   2022           Allergies  Allergen Reactions   Sulfa (Sulfonamide Antibiotics) Other (See Comments)      Numbness around lips   Amlodipine  Swelling      Ankle swelling   Azithromycin  Unknown   Indomethacin Unknown      REACTION: swelling, rash   Iodine Itching   Levofloxacin  In D5w Unknown      Muscle pain    Losartan  Unknown      The 100 mg pill caused some back pain   (? Also a little dizziness and possibly headache)   Penicillins Unknown      REACTION: urticaria (hives)   Betadine [Povidone-Iodine] Hives and Itching   Penicillin Other (See Comments)      Knots in abd, easy bruising            Current Outpatient Medications on File Prior to Visit  Medication Sig Dispense Refill    cholecalciferol (VITAMIN D3) 1,000 unit capsule Take 2,000 Units by mouth once daily       ferrous sulfate 325 (65 FE) MG tablet Take 325 mg by mouth daily with breakfast.       hydroCHLOROthiazide  (HYDRODIURIL ) 25 MG tablet Take 25 mg by mouth once daily.       metoprolol  succinate (TOPROL -XL) 100 MG XL tablet Take 100 mg by mouth once daily.       multivitamin tablet Take 1 tablet by mouth once daily.       omega-3 acid ethyl esters (LOVAZA) 1 gram capsule Take 1 g by mouth once daily       amLODIPine  (NORVASC ) 5 MG tablet Take 1 tablet (5 mg total) by mouth once daily 30 tablet 6    No current facility-administered medications on file prior to visit.           Family History  Problem Relation Age of Onset   Myocardial Infarction (Heart attack) Mother  COD   High blood pressure (Hypertension) Father     Throat cancer Father     Diabetes Father     Diabetes Sister     High blood pressure (Hypertension) Sister     Diabetes Brother     No Known Problems Maternal Grandmother     No Known Problems Maternal Grandfather     No Known Problems Paternal Grandmother     No Known Problems Paternal Grandfather     Diabetes Sister     High blood pressure (Hypertension) Sister     Diabetes Sister     High blood pressure (Hypertension) Sister     Diabetes Sister     High blood pressure (Hypertension) Sister     No Known Problems Brother     Stroke Brother     Transient ischemic attack Brother     Diabetes Brother          amputation also    Thyroid  disease Daughter     Thyroid  nodules Daughter        Social History        Tobacco Use  Smoking Status Former   Current packs/day: 0.00   Types: Cigarettes   Quit date: 1989   Years since quitting: 36.7  Smokeless Tobacco Never      Social History         Socioeconomic History   Marital status: Single  Tobacco Use   Smoking status: Former      Current packs/day: 0.00      Types: Cigarettes      Quit date: 1989       Years since quitting: 36.7   Smokeless tobacco: Never  Vaping Use   Vaping status: Never Used  Substance and Sexual Activity   Alcohol use: No   Drug use: Never   Sexual activity: Defer    Social Drivers of Acupuncturist Strain: Low Risk  (01/24/2021)    Received from [facility]    Overall Financial Resource Strain (CARDIA)     Difficulty of Paying Living Expenses: Not hard at all  Food Insecurity: No Food Insecurity (01/24/2021)    Received from [facility]    Hunger Vital Sign     Within the past 12 months, you worried that your food would run out before you got the money to buy more.: Never true     Within the past 12 months, the food you bought just didn't last and you didn't have money to get more.: Never true  Transportation Needs: No Transportation Needs (01/24/2021)    Received from [facility]     Lack of Transportation (Medical): No     Lack of Transportation (Non-Medical): No  Physical Activity: Sufficiently Active (01/24/2021)    Received from [facility]    Exercise Vital Sign     On average, how many days per week do you engage in moderate to strenuous exercise (like a brisk walk)?: 3 days     On average, how many minutes do you engage in exercise at this level?: 60 min  Stress: No Stress Concern Present (01/24/2021)    Received from [facility] - Occupational Stress Questionnaire     Feeling of Stress : Not at all  Social Connections: Moderately Integrated (01/24/2021)    Received from [facility]    Social Connection and Isolation Panel  In a typical week, how many times do you talk on the phone with family, friends, or neighbors?: More than three times a week     How often do you get together with friends or relatives?: Three times a week     How often do you attend church or religious services?: More than 4 times per year     Do you belong to any clubs or  organizations such as church groups, unions, fraternal or athletic groups, or school groups?: Yes     How often do you attend meetings of the clubs or organizations you belong to?: More than 4 times per year     Are you married, widowed, divorced, separated, never married, or living with a partner?: Never married  Housing Stability: Unknown (06/21/2023)    Housing Stability Vital Sign     Homeless in the Last Year: No      Objective:          Vitals:    12/19/23 1504 12/19/23 1505  BP: 132/85    Pulse: 93    Temp: 36.7 C (98 F)    SpO2: 93%    Weight: 71.6 kg (157 lb 12.8 oz)    Height: 167.6 cm (5' 6)    PainSc:   0-No pain      Exam Gen: NAD Abd: soft Rectal: large anterior skin tag     Labs, Imaging and Diagnostic Testing:   Procedure: Anoscopy 12/19/2023 Surgeon: Debby After the risks and benefits were explained, written consent was obtained for above procedure.  A medical assistant chaperone was present thoroughout the entire procedure. Examination chaperoned by Burnard Crete, LPN. Anesthesia: none Diagnosis: rectal bleeding Findings: Grade 3 right anterior hemorrhoid with possibly dysplastic lesion at the dentate line.   Assessment and Plan:  Prolapsed internal hemorrhoids, grade 3  (primary encounter diagnosis)     78 year old female with continued hemorrhoidal prolapse and rectal bleeding despite THD.  She has 1 grade 3 hemorrhoid that appears inflamed and possibly has a dysplastic lesion associated with this.  I have recommended single column hemorrhoidectomy.  I do not think the other 2 hemorrhoids will need any treatment at this time.  Risk include bleeding, pain and recurrence.  All questions answered.  Patient would like to proceed with surgery.   Bernarda JAYSON Debby, MD Colon and Rectal Surgery [facility]

## 2024-02-14 NOTE — Anesthesia Procedure Notes (Signed)
 Procedure Name: MAC Date/Time: 02/14/2024 7:30 AM  Performed by: Obadiah Reyes BROCKS, CRNAPre-anesthesia Checklist: Emergency Drugs available, Patient identified, Suction available, Patient being monitored and Timeout performed Patient Re-evaluated:Patient Re-evaluated prior to induction Oxygen Delivery Method: Simple face mask Preoxygenation: Pre-oxygenation with 100% oxygen Induction Type: IV induction Airway Equipment and Method: Oral airway

## 2024-02-14 NOTE — Discharge Instructions (Addendum)

## 2024-02-14 NOTE — Anesthesia Preprocedure Evaluation (Signed)
 Anesthesia Evaluation  Patient identified by MRN, date of birth, ID band Patient awake    Reviewed: Allergy & Precautions, H&P , NPO status , Patient's Chart, lab work & pertinent test results  Airway Mallampati: II  TM Distance: >3 FB Neck ROM: Full    Dental  (+) Dental Advisory Given   Pulmonary neg pulmonary ROS, former smoker   Pulmonary exam normal breath sounds clear to auscultation       Cardiovascular hypertension, Pt. on medications negative cardio ROS Normal cardiovascular exam Rhythm:Regular Rate:Normal     Neuro/Psych negative neurological ROS  negative psych ROS   GI/Hepatic Neg liver ROS,GERD  ,,  Endo/Other  Hypothyroidism    Renal/GU negative Renal ROS  negative genitourinary   Musculoskeletal  (+) Arthritis , Osteoarthritis,    Abdominal   Peds negative pediatric ROS (+)  Hematology negative hematology ROS (+)   Anesthesia Other Findings   Reproductive/Obstetrics negative OB ROS                              Anesthesia Physical Anesthesia Plan  ASA: 2  Anesthesia Plan: MAC   Post-op Pain Management: Tylenol  PO (pre-op)* and Gabapentin  PO (pre-op)*   Induction: Intravenous  PONV Risk Score and Plan: 2 and Propofol  infusion and Treatment may vary due to age or medical condition  Airway Management Planned: Simple Face Mask  Additional Equipment:   Intra-op Plan:   Post-operative Plan:   Informed Consent: I have reviewed the patients History and Physical, chart, labs and discussed the procedure including the risks, benefits and alternatives for the proposed anesthesia with the patient or authorized representative who has indicated his/her understanding and acceptance.     Dental advisory given  Plan Discussed with: CRNA  Anesthesia Plan Comments:         Anesthesia Quick Evaluation

## 2024-02-15 ENCOUNTER — Encounter (HOSPITAL_COMMUNITY): Payer: Self-pay | Admitting: General Surgery

## 2024-02-15 LAB — SURGICAL PATHOLOGY

## 2024-02-26 LAB — HM MAMMOGRAPHY

## 2024-03-03 ENCOUNTER — Telehealth: Payer: Self-pay | Admitting: *Deleted

## 2024-03-03 NOTE — Telephone Encounter (Signed)
 Copied from CRM #[id number]. Topic: Clinical - Medication Question >> Mar 03, 2024 10:37 AM Viola F wrote: Reason for CRM: Patient requesting call back regarding the hydrocortisone  (PROCTOSOL HC) 2.5 % rectal cream [[id number]]  DISCONTINUED - Dermatologist prescribed it but pharmacy is not hearing back from them and she wants to know if [facility] will fill it, she uses it for her eye and she wants it sent to the [id number], [place] - [address]  Please call her at [phone]

## 2024-03-03 NOTE — Telephone Encounter (Signed)
 I am unfamiliar with use for the eye?   Will need to come from the original prescriber

## 2024-03-03 NOTE — Telephone Encounter (Signed)
 Pt notified of Dr. Graham comments she did say it's hydrocortisone  2.5 usp ointment that she places under her eye (not rectal cream as stated below) pt will f/u with Derm for refill

## 2024-03-16 ENCOUNTER — Other Ambulatory Visit: Payer: Self-pay | Admitting: Family Medicine

## 2024-05-06 ENCOUNTER — Ambulatory Visit

## 2025-01-28 ENCOUNTER — Other Ambulatory Visit

## 2025-02-04 ENCOUNTER — Encounter: Admitting: Family Medicine
